# Patient Record
Sex: Female | Born: 1950 | Race: White | Hispanic: Refuse to answer | Marital: Single | Smoking: Former smoker
Health system: Southern US, Community
[De-identification: ages and names within clinical notes are randomized; demographics above are authoritative.]

## PROBLEM LIST (undated history)

## (undated) DIAGNOSIS — C4490 Unspecified malignant neoplasm of skin, unspecified: Secondary | ICD-10-CM

## (undated) DIAGNOSIS — E039 Hypothyroidism, unspecified: Secondary | ICD-10-CM

## (undated) DIAGNOSIS — K219 Gastro-esophageal reflux disease without esophagitis: Secondary | ICD-10-CM

## (undated) DIAGNOSIS — F32A Depression, unspecified: Secondary | ICD-10-CM

## (undated) DIAGNOSIS — F411 Generalized anxiety disorder: Secondary | ICD-10-CM

## (undated) DIAGNOSIS — I10 Essential (primary) hypertension: Secondary | ICD-10-CM

## (undated) DIAGNOSIS — T7840XA Allergy, unspecified, initial encounter: Secondary | ICD-10-CM

## (undated) DIAGNOSIS — Z8669 Personal history of other diseases of the nervous system and sense organs: Secondary | ICD-10-CM

## (undated) HISTORY — DX: Personal history of other diseases of the nervous system and sense organs: Z86.69

## (undated) HISTORY — DX: Unspecified malignant neoplasm of skin, unspecified: C44.90

## (undated) HISTORY — PX: BREAST CYST EXCISION: SHX579

## (undated) HISTORY — DX: Essential (primary) hypertension: I10

## (undated) HISTORY — DX: Allergy, unspecified, initial encounter: T78.40XA

## (undated) HISTORY — PX: ABDOMINAL HYSTERECTOMY: SHX81

## (undated) HISTORY — PX: CHOLECYSTECTOMY: SHX55

## (undated) HISTORY — PX: SKIN CANCER EXCISION: SHX779

## (undated) HISTORY — PX: OTHER SURGICAL HISTORY: SHX169

## (undated) HISTORY — DX: Depression, unspecified: F32.A

## (undated) HISTORY — DX: Generalized anxiety disorder: F41.1

## (undated) HISTORY — DX: Hypothyroidism, unspecified: E03.9

---

## 2012-05-01 LAB — HM MAMMOGRAPHY

## 2015-10-24 ENCOUNTER — Encounter: Payer: Self-pay | Admitting: Osteopathic Medicine

## 2015-10-24 ENCOUNTER — Ambulatory Visit (INDEPENDENT_AMBULATORY_CARE_PROVIDER_SITE_OTHER): Payer: Commercial Managed Care - HMO | Admitting: Osteopathic Medicine

## 2015-10-24 VITALS — BP 135/65 | HR 64 | Ht 64.0 in | Wt 147.0 lb

## 2015-10-24 DIAGNOSIS — Z90710 Acquired absence of both cervix and uterus: Secondary | ICD-10-CM

## 2015-10-24 DIAGNOSIS — Z8669 Personal history of other diseases of the nervous system and sense organs: Secondary | ICD-10-CM

## 2015-10-24 DIAGNOSIS — Z0000 Encounter for general adult medical examination without abnormal findings: Secondary | ICD-10-CM | POA: Diagnosis not present

## 2015-10-24 DIAGNOSIS — R946 Abnormal results of thyroid function studies: Secondary | ICD-10-CM

## 2015-10-24 DIAGNOSIS — Z62820 Parent-biological child conflict: Secondary | ICD-10-CM | POA: Diagnosis not present

## 2015-10-24 DIAGNOSIS — R7989 Other specified abnormal findings of blood chemistry: Secondary | ICD-10-CM

## 2015-10-24 HISTORY — DX: Personal history of other diseases of the nervous system and sense organs: Z86.69

## 2015-10-24 HISTORY — DX: Acquired absence of both cervix and uterus: Z90.710
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
## 2015-10-24 NOTE — Patient Instructions (Signed)
DR. Redgie Grayer IMPORTANT  INFORMATION FOR ALL NEW PATIENTS!  PLEASE REVIEW CAREFULLY!    FOLLOW-UP!   Let's plan to follow-up in the office in 1 year for annual wellness exam.   We may schedule follow-up sooner if routine labs/tests show any problems.   Please don't hesitate to make an appointment sooner if you're having any acute concerns or problems!  REGARDING PRESCRIPTION MEDICATIONS  Please let your pharmacy know when you are running low on medications/refills (do not wait until you are out of medicines). Your pharmacy will send our office a request for the appropriate medications. Please allow our office 2-3 business days to process the needed refills.   For controlled substances, you may be asked to schedule an office visit and/or undergo a random urine drug screen for continuation of such medications.   REGARDING ANY CARE YOU RECEIVE OUTSIDE OUR OFFICE  At any visits to any specialists, or if you receive vaccines anywhere outside our office, please provide our clinic information so that they can forward [place] any records, including any tests which are done, changes to your medications, or new vaccinations.   Also, if you are ever treated in an emergency room or if you are admitted to the hospital, please contact our office after your discharge. We encourage our patients to schedule a follow-up visit with their PCP any time they are treated for a serious illness or injury.   This allows all your physicians to communicate effectively, putting your primary care doctor at the center of your medical care and allowing [place] to effectively coordinate your care.  REGARDING PREVENTIVE CARE & WELLNESS, AKA "ANNUAL PHYSICAL"  Let's plan to follow-up here in the office in 12 months for [place].   This visit is very important to make sure we talk with our patients about all recommended cancer screenings, vaccines, birth control (if desired) and screenings for  other diseases based on your personal/family history. Plus, this visit should be completely covered under your insurance!   We also like to talk about any changes to your health over the past year and make sure any chronic conditions are well-controlled.    If you would like to, you can get routine lab work done 2 - 3 days before your physical so that we can go over the results in person at your appointment. Please call our office a week before that appointment so we can make sure the lab has the appropriate orders for your blood draw.   Please note: insurance should completely cover one preventive care visit annually, and they should completely cover most tests associated with preventive care such as routine labs, mammograms, etc. If you have any other medical concerns to address, you may be asked to reschedule your annual physical, or schedule a separate visit to address other medical concerns. Or, you may be billed for care related to "problem-based visit" in addition to your "preventive care visit." If you have questions about this, please contact our office, your insurance company, or [facility] billing department.   REGARDING ANY FORMS NEEDING YOUR DOCTOR'S SIGNATURE  If you ever have any paperwork which needs to be completed by your PCP, you may be asked to come to the office if that paperwork requires a complex review of your medical history - we want to make sure everything is completed to your satisfaction and is completed correctly the first time, particularly with FMLA or other employment/legal matters!    Please let us  know if there is anything else we can do for you. Take care! -Dr. Loni Muse.      Immunization Schedule, Adult  Influenza vaccine.  All adults should be immunized every year.  All adults, including pregnant women and people with hives-only allergy to eggs can receive the inactivated influenza (IIV) vaccine.  Adults aged 18-49 years can receive the recombinant influenza  (RIV) vaccine. The RIV vaccine does not contain any egg protein.  Adults aged 81 years or older can receive the standard-dose IIV or the high-dose IIV.  Tetanus, diphtheria, and acellular pertussis (Td, Tdap) vaccine.  Pregnant women should receive 1 dose of Tdap vaccine during each pregnancy. The dose should be obtained regardless of the length of time since the last dose. Immunization is preferred during the 27th to 36th week of gestation.  An adult who has not previously received Tdap or who does not know his or her vaccine status should receive 1 dose of Tdap. This initial dose should be followed by tetanus and diphtheria toxoids (Td) booster doses every 10 years.  Adults with an unknown or incomplete history of completing a 3-dose immunization series with Td-containing vaccines should begin or complete a primary immunization series including a Tdap dose.  Adults should receive a Td booster every 10 years.  Varicella vaccine.  An adult without evidence of immunity to varicella should receive 2 doses or a second dose if he or she has previously received 1 dose.  Pregnant females who do not have evidence of immunity should receive the first dose after pregnancy. This first dose should be obtained before leaving the health care facility. The second dose should be obtained 4-8 weeks after the first dose.Marland Kitchen  Zoster vaccine.  One dose is recommended for adults aged 46 years or older unless certain conditions are present.  Measles, mumps, and rubella (MMR) vaccine.  Adults born before [birth year] generally are considered immune to measles and mumps.  Adults born in [birth year] or later should have 1 or more doses of MMR vaccine unless there is a contraindication to the vaccine or there is laboratory evidence of immunity to each of the three diseases.  A routine second dose of MMR vaccine should be obtained at least 28 days after the first dose for students attending postsecondary schools, health care  workers, or international travelers.  People who received inactivated measles vaccine or an unknown type of measles vaccine during 1963-1967 should receive 2 doses of MMR vaccine.  People who received inactivated mumps vaccine or an unknown type of mumps vaccine before 1979 and are at high risk for mumps infection should consider immunization with 2 doses of MMR vaccine.  For females of childbearing age, rubella immunity should be determined. If there is no evidence of immunity, females who are not pregnant should be vaccinated. If there is no evidence of immunity, females who are pregnant should delay immunization until after pregnancy.  Unvaccinated health care workers born before [birth year] who lack laboratory evidence of measles, mumps, or rubella immunity or laboratory confirmation of disease should consider measles and mumps immunization with 2 doses of MMR vaccine or rubella immunization with 1 dose of MMR vaccine.  Pneumococcal 13-valent conjugate (PCV13) vaccine.  When indicated, a person who is uncertain of his or her immunization history and has no record of immunization should receive the PCV13 vaccine.  An adult aged 33 years or older who has certain medical conditions and has not been previously immunized should receive 1 dose of PCV13 vaccine. This  PCV13 should be followed with a dose of pneumococcal polysaccharide (PPSV23) vaccine. The PPSV23 vaccine dose should be obtained at least 8 weeks after the dose of PCV13 vaccine.  An adult aged 27 years or older who has certain medical conditions and previously received 1 or more doses of PPSV23 vaccine should receive 1 dose of PCV13. The PCV13 vaccine dose should be obtained 1 or more years after the last PPSV23 vaccine dose.  Pneumococcal polysaccharide (PPSV23) vaccine.  When PCV13 is also indicated, PCV13 should be obtained first.  All adults aged 81 years and older should be immunized.  An adult younger than age 92 years who has  certain medical conditions should be immunized.  Any person who resides in a nursing home or long-term care facility should be immunized.  An adult smoker should be immunized.  People with an immunocompromised condition and certain other conditions should receive both PCV13 and PPSV23 vaccines.  People with human immunodeficiency virus (HIV) infection should be immunized as soon as possible after diagnosis.  Immunization during chemotherapy or radiation therapy should be avoided.  Routine use of PPSV23 vaccine is not recommended for American Indians, [place] Natives, or people younger than 65 years unless there are medical conditions that require PPSV23 vaccine.  When indicated, people who have unknown immunization and have no record of immunization should receive PPSV23 vaccine.  One-time revaccination 5 years after the first dose of PPSV23 is recommended for people aged 19-64 years who have chronic kidney failure, nephrotic syndrome, asplenia, or immunocompromised conditions.  People who received 1-2 doses of PPSV23 before age 52 years should receive another dose of PPSV23 vaccine at age 66 years or later if at least 5 years have passed since the previous dose.  Doses of PPSV23 are not needed for people immunized with PPSV23 at or after age 107 years.  Meningococcal vaccine.  Adults with asplenia or persistent complement component deficiencies should receive 2 doses of quadrivalent meningococcal conjugate (MenACWY-D) vaccine. The doses should be obtained at least 2 months apart.  Microbiologists working with certain meningococcal bacteria, [place] recruits, people at risk during an outbreak, and people who travel to or live in countries with a high rate of meningitis should be immunized.  A first-year college student up through age 46 years who is living in a residence hall should receive a dose if he or she did not receive a dose on or after his or her 16th birthday.  Adults who have  certain high-risk conditions should receive one or more doses of vaccine.  Hepatitis A vaccine.  Adults who wish to be protected from this disease, have certain high-risk conditions, work with hepatitis A-infected animals, work in hepatitis A research labs, or travel to or work in countries with a high rate of hepatitis A should be immunized.  Adults who were previously unvaccinated and who anticipate close contact with an international adoptee during the first 60 days after arrival in the [place] from a country with a high rate of hepatitis A should be immunized.  Hepatitis B vaccine.  Adults who wish to be protected from this disease, have certain high-risk conditions, may be exposed to blood or other infectious body fluids, are household contacts or sex partners of hepatitis B positive people, are clients or workers in certain care facilities, or travel to or work in countries with a high rate of hepatitis B should be immunized.  Haemophilus influenzae type b (Hib) vaccine.  A previously unvaccinated person with asplenia or sickle  cell disease or having a scheduled splenectomy should receive 1 dose of Hib vaccine.  Regardless of previous immunization, a recipient of a hematopoietic stem cell transplant should receive a 3-dose series 6-12 months after his or her successful transplant.  Hib vaccine is not recommended for adults with HIV infection.   This information is not intended to replace advice given to you by your health care provider. Make sure you discuss any questions you have with your health care provider.   Document Released: 05/11/2003 Document Revised: 06/15/2012 Document Reviewed: 04/07/2012 Elsevier Interactive Patient Education [employer].

## 2015-10-27 DIAGNOSIS — R7989 Other specified abnormal findings of blood chemistry: Secondary | ICD-10-CM | POA: Diagnosis not present

## 2015-10-27 DIAGNOSIS — E7800 Pure hypercholesterolemia, unspecified: Secondary | ICD-10-CM | POA: Diagnosis not present

## 2015-10-27 DIAGNOSIS — Z0000 Encounter for general adult medical examination without abnormal findings: Secondary | ICD-10-CM | POA: Diagnosis not present

## 2015-10-27 LAB — CBC WITH DIFFERENTIAL/PLATELET
Basophils Absolute: 0 cells/uL (ref 0–200)
Basophils Relative: 0 %
EOS PCT: 2 %
Eosinophils Absolute: 146 cells/uL (ref 15–500)
HCT: 39.2 % (ref 35.0–45.0)
Hemoglobin: 12.6 g/dL (ref 11.7–15.5)
LYMPHS ABS: 1533 {cells}/uL (ref 850–3900)
LYMPHS PCT: 21 %
MCH: 26.5 pg — AB (ref 27.0–33.0)
MCHC: 32.1 g/dL (ref 32.0–36.0)
MCV: 82.4 fL (ref 80.0–100.0)
MONOS PCT: 6 %
MPV: 10 fL (ref 7.5–12.5)
Monocytes Absolute: 438 cells/uL (ref 200–950)
NEUTROS PCT: 71 %
Neutro Abs: 5183 cells/uL (ref 1500–7800)
PLATELETS: 241 10*3/uL (ref 140–400)
RBC: 4.76 MIL/uL (ref 3.80–5.10)
RDW: 14.2 % (ref 11.0–15.0)
WBC: 7.3 10*3/uL (ref 3.8–10.8)

## 2015-10-28 LAB — LIPID PANEL
CHOL/HDL RATIO: 4.3 ratio (ref <=5.0)
Cholesterol: 212 mg/dL — ABNORMAL HIGH (ref 125–200)
HDL: 49 mg/dL (ref >=46)
LDL CALC: 133 mg/dL — AB (ref <130)
Triglycerides: 149 mg/dL (ref <150)
VLDL: 30 mg/dL (ref <30)

## 2015-10-28 LAB — COMPLETE METABOLIC PANEL WITH GFR
ALBUMIN: 4.3 g/dL (ref 3.6–5.1)
ALK PHOS: 83 U/L (ref 33–130)
ALT: 13 U/L (ref 6–29)
AST: 17 U/L (ref 10–35)
BILIRUBIN TOTAL: 0.5 mg/dL (ref 0.2–1.2)
BUN: 8 mg/dL (ref 7–25)
CO2: 27 mmol/L (ref 20–31)
CREATININE: 0.67 mg/dL (ref 0.50–0.99)
Calcium: 9.7 mg/dL (ref 8.6–10.4)
Chloride: 105 mmol/L (ref 98–110)
GFR, Est African American: >89 mL/min (ref >=60)
GLUCOSE: 98 mg/dL (ref 65–99)
Potassium: 4.3 mmol/L (ref 3.5–5.3)
SODIUM: 142 mmol/L (ref 135–146)
TOTAL PROTEIN: 6.9 g/dL (ref 6.1–8.1)

## 2015-10-28 LAB — HEPATITIS C ANTIBODY: HCV Ab: NEGATIVE

## 2015-10-28 LAB — TSH: TSH: 8.4 m[IU]/L — AB

## 2015-10-28 LAB — VITAMIN D 25 HYDROXY (VIT D DEFICIENCY, FRACTURES): VIT D 25 HYDROXY: 24 ng/mL — AB (ref 30–100)

## 2015-10-28 LAB — HIV ANTIBODY (ROUTINE TESTING W REFLEX): HIV 1&2 Ab, 4th Generation: NONREACTIVE

## 2015-10-30 NOTE — Addendum Note (Signed)
Addended by: Maryla Morrow on: 10/30/2015 10:33 AM   Modules accepted: Orders

## 2015-11-02 ENCOUNTER — Other Ambulatory Visit: Payer: Self-pay | Admitting: Osteopathic Medicine

## 2015-11-02 ENCOUNTER — Other Ambulatory Visit: Payer: Self-pay

## 2015-11-02 DIAGNOSIS — R7989 Other specified abnormal findings of blood chemistry: Secondary | ICD-10-CM

## 2015-11-02 DIAGNOSIS — R946 Abnormal results of thyroid function studies: Secondary | ICD-10-CM | POA: Diagnosis not present

## 2015-11-03 LAB — THYROGLOBULIN ANTIBODY: Thyroglobulin Ab: 1 IU/mL (ref <2)

## 2015-11-03 LAB — THYROID PANEL WITH TSH
FREE THYROXINE INDEX: 1.4 (ref 1.4–3.8)
T3 UPTAKE: 27 % (ref 22–35)
T4 TOTAL: 5.3 ug/dL (ref 4.5–12.0)
TSH: 9.67 m[IU]/L — AB

## 2015-11-03 LAB — THYROGLOBULIN LEVEL: THYROGLOBULIN: 119.2 ng/mL — AB (ref 2.8–40.9)

## 2015-11-04 LAB — THYROID PEROXIDASE ANTIBODY: Thyroperoxidase Ab SerPl-aCnc: >900 IU/mL — ABNORMAL HIGH (ref <9)

## 2015-11-10 ENCOUNTER — Encounter: Payer: Self-pay | Admitting: Osteopathic Medicine

## 2015-11-10 ENCOUNTER — Ambulatory Visit (INDEPENDENT_AMBULATORY_CARE_PROVIDER_SITE_OTHER): Payer: Commercial Managed Care - HMO | Admitting: Osteopathic Medicine

## 2015-11-10 VITALS — BP 115/60 | HR 63 | Temp 97.9°F | Resp 16 | Ht 64.0 in | Wt 147.0 lb

## 2015-11-10 DIAGNOSIS — E039 Hypothyroidism, unspecified: Secondary | ICD-10-CM | POA: Diagnosis not present

## 2015-11-10 DIAGNOSIS — L0291 Cutaneous abscess, unspecified: Secondary | ICD-10-CM

## 2015-11-10 HISTORY — DX: Hypothyroidism, unspecified: E03.9

## 2015-11-10 MED ORDER — LEVOTHYROXINE SODIUM 100 MCG PO TABS: 100.0000 ug | ORAL_TABLET | Freq: Every day | ORAL | 1 refills | Status: DC

## 2015-11-10 NOTE — Progress Notes (Signed)
HPI: Natasha Wilson is a 65 y.o. female  who presents to [place] today, 11/10/15,  for chief complaint of:  Chief Complaint  Patient presents with  . Follow-up    thyroid level    Hypothyroid: Patient is, by labs, subclinical hypothyroidism. However she is experiencing some hair loss, significant fatigue, problems with constipation. Antibody levels are high as well. Diagnosis was a clean to the patient, recommendation is, based on her high antibody levels and her elevated TSH with symptoms, despite normal T3-T4 I recommend starting medication and patient is amenable to this.  Skin Problem: Patient states that she has a bump on the anterior chest, painful, not draining area did no injury. No fever or chills.   Past medical, surgical, social and family history reviewed: No past medical history on file. Past Surgical History:  Procedure Laterality Date  . ABDOMINAL HYSTERECTOMY    . CHOLECYSTECTOMY     Social History  Substance Use Topics  . Smoking status: Former Research scientist (life sciences)  . Smokeless tobacco: Never Used  . Alcohol use Not on file   Family History  Problem Relation Age of Onset  . Alcohol abuse Father   . Heart attack Father   . Cancer Sister     LUNG  . Depression Paternal Grandfather      Current medication list and allergy/intolerance information reviewed:   Current Outpatient Prescriptions  Medication Sig Dispense Refill  . levothyroxine (SYNTHROID) 100 MCG tablet Take 1 tablet (100 mcg total) by mouth daily before breakfast. Due for labs in 6-8 weeks (approx 01/02/16) 30 tablet 1   No current facility-administered medications for this visit.    No Known Allergies    Review of Systems:  Constitutional:  No  fever, no chills, No recent illness, No unintentional weight changes. (+)significant fatigue.   Cardiac: No  chest pain, No  pressure,  Respiratory:  No  shortness of breath  Musculoskeletal: No new  myalgia/arthralgia  Skin: No  Rash, (+)other wounds/concerning lesions  Exam:  BP 115/60 (BP Location: Right Arm, Patient Position: Sitting, Cuff Size: Normal)   Pulse 63   Temp 97.9 F (36.6 C) (Oral)   Resp 16   Ht 5' 4"  (1.626 m)   Wt 147 lb (66.7 kg)   SpO2 100%   BMI 25.23 kg/m   Constitutional: VS see above. General Appearance: alert, well-developed, well-nourished, NAD.   Neck: No masses, trachea midline. No thyroid enlargement. No tenderness/mass appreciated. No lymphadenopathy  Respiratory: Normal respiratory effort.   Skin: Tender, fluctuant, erythematous area on right chest consistent with abscess. Skin is otherwise warm, dry, intact. No rash/ulcer. No concerning nevi or subq nodules on limited exam.    Psychiatric: Normal judgment/insight. Normal mood and affect. Oriented x3.    Recent Results (from the past 2160 hour(s))  CBC with Differential/Platelet     Status: Abnormal   Collection Time: 10/27/15  8:22 AM  Result Value Ref Range   WBC 7.3 3.8 - 10.8 K/uL   RBC 4.76 3.80 - 5.10 MIL/uL   Hemoglobin 12.6 11.7 - 15.5 g/dL   HCT 39.2 35.0 - 45.0 %   MCV 82.4 80.0 - 100.0 fL   MCH 26.5 (L) 27.0 - 33.0 pg   MCHC 32.1 32.0 - 36.0 g/dL   RDW 14.2 11.0 - 15.0 %   Platelets 241 140 - 400 K/uL   MPV 10.0 7.5 - 12.5 fL   Neutro Abs 5,183 1,500 - 7,800 cells/uL   Lymphs Abs  1,533 850 - 3,900 cells/uL   Monocytes Absolute 438 200 - 950 cells/uL   Eosinophils Absolute 146 15 - 500 cells/uL   Basophils Absolute 0 0 - 200 cells/uL   Neutrophils Relative % 71 %   Lymphocytes Relative 21 %   Monocytes Relative 6 %   Eosinophils Relative 2 %   Basophils Relative 0 %   Smear Review Criteria for review not met   COMPLETE METABOLIC PANEL WITH GFR     Status: None   Collection Time: 10/27/15  8:22 AM  Result Value Ref Range   Sodium 142 135 - 146 mmol/L   Potassium 4.3 3.5 - 5.3 mmol/L   Chloride 105 98 - 110 mmol/L   CO2 27 20 - 31 mmol/L   Glucose, Bld 98 65 - 99  mg/dL   BUN 8 7 - 25 mg/dL   Creat 0.67 0.50 - 0.99 mg/dL    Comment:   For patients > or = 65 years of age: The upper reference limit for Creatinine is approximately 13% higher for people identified as African-American.      Total Bilirubin 0.5 0.2 - 1.2 mg/dL   Alkaline Phosphatase 83 33 - 130 U/L   AST 17 10 - 35 U/L   ALT 13 6 - 29 U/L   Total Protein 6.9 6.1 - 8.1 g/dL   Albumin 4.3 3.6 - 5.1 g/dL   Calcium 9.7 8.6 - 10.4 mg/dL   GFR, Est African American >89 >=60 mL/min   GFR, Est Non African American >89 >=60 mL/min  HIV antibody     Status: None   Collection Time: 10/27/15  8:22 AM  Result Value Ref Range   HIV 1&2 Ab, 4th Generation NONREACTIVE NONREACTIVE    Comment:   HIV-1 antigen and HIV-1/HIV-2 antibodies were not detected.  There is no laboratory evidence of HIV infection.   HIV-1/2 Antibody Diff        Not indicated. HIV-1 RNA, Qual TMA          Not indicated.     PLEASE NOTE: This information has been disclosed to you from records whose confidentiality may be protected by state law. If your state requires such protection, then the state law prohibits you from making any further disclosure of the information without the specific written consent of the person to whom it pertains, or as otherwise permitted by law. A general authorization for the release of medical or other information is NOT sufficient for this purpose.   The performance of this assay has not been clinically validated in patients less than 50 years old.   For additional information please refer to http://education.questdiagnostics.com/faq/FAQ106.  (This link is being provided for informational/educational purposes only.)     Hepatitis C antibody     Status: None   Collection Time: 10/27/15  8:22 AM  Result Value Ref Range   HCV Ab NEGATIVE NEGATIVE  Lipid panel     Status: Abnormal   Collection Time: 10/27/15  8:22 AM  Result Value Ref Range   Cholesterol 212 (H) 125 - 200 mg/dL    Triglycerides 149 <150 mg/dL   HDL 49 >=46 mg/dL   Total CHOL/HDL Ratio 4.3 <=5.0 Ratio   VLDL 30 <30 mg/dL   LDL Cholesterol 133 (H) <130 mg/dL    Comment:   Total Cholesterol/HDL Ratio:CHD Risk                        Coronary Heart Disease  Risk Table                                        Men       Women          1/2 Average Risk              3.4        3.3              Average Risk              5.0        4.4           2X Average Risk              9.6        7.1           3X Average Risk             23.4       11.0 Use the calculated Patient Ratio above and the CHD Risk table  to determine the patient's CHD Risk.   TSH     Status: Abnormal   Collection Time: 10/27/15  8:22 AM  Result Value Ref Range   TSH 8.40 (H) mIU/L    Comment:   Reference Range   > or = 20 Years  0.40-4.50   Pregnancy Range First trimester  0.26-2.66 Second trimester 0.55-2.73 Third trimester  0.43-2.91     VITAMIN D 25 Hydroxy (Vit-D Deficiency, Fractures)     Status: Abnormal   Collection Time: 10/27/15  8:22 AM  Result Value Ref Range   Vit D, 25-Hydroxy 24 (L) 30 - 100 ng/mL    Comment: Vitamin D Status           25-OH Vitamin D        Deficiency                <20 ng/mL        Insufficiency         20 - 29 ng/mL        Optimal             > or = 30 ng/mL   For 25-OH Vitamin D testing on patients on D2-supplementation and patients for whom quantitation of D2 and D3 fractions is required, the [facility] 25-OH VIT D, (D2,D3), LC/MS/MS is recommended: order code [id number] (patients > 2 yrs).   Thyroid Panel With TSH     Status: Abnormal   Collection Time: 11/02/15  8:11 AM  Result Value Ref Range   T4, Total 5.3 4.5 - 12.0 ug/dL   T3 Uptake 27 22 - 35 %   Free Thyroxine Index 1.4 1.4 - 3.8   TSH 9.67 (H) mIU/L    Comment:   Reference Range   > or = 20 Years  0.40-4.50   Pregnancy Range First trimester  0.26-2.66 Second trimester 0.55-2.73 Third trimester  0.43-2.91      Thyroglobulin Level     Status: Abnormal   Collection Time: 11/02/15  8:13 AM  Result Value Ref Range   Thyroglobulin 119.2 (H) 2.8 - 40.9 ng/mL    Comment: Thyroglobulin antibodies (TGAb) interfere with Thyroglobulin (TG) assays; therefore, Thyroglobulin antibody (TGAb) assay should always be performed in conjunction with a Thyroglobulin (TG) assay.   This test was performed using the [facility] chemiluminescent  method.  Values obtained from different assay methods cannot be used interchangeably.  Thyroglobulin levels, regardless of value, should not be interpreted as absolute evidence of the presence or absence of disease.   Thyroid peroxidase antibody     Status: Abnormal   Collection Time: 11/02/15  8:13 AM  Result Value Ref Range   Thyroperoxidase Ab SerPl-aCnc >900 (H) <9 IU/mL  Thyroglobulin antibody     Status: None   Collection Time: 11/02/15  8:14 AM  Result Value Ref Range   Thyroglobulin Ab 1 <2 IU/mL    PRE-OP DIAGNOSIS: Abscess of skin of anterior chest   POST-OP DIAGNOSIS: Same  PROCEDURE: incision and drainage of abscess Performing Physician: Sheppard Coil    PROCEDURE:   Patient informed consent obtained verbally after discussion of risks (including but not limited to pain, infection, bleeding, damage to surrounding tissues, incomplete evacuation/treatment of infection, recurrence)  and benefits (adequate treatment and diagnosis, relief of pain). All questions answered prior to procedure.   A timeout protocol was performed prior to initiating the procedure.  The area was prepared and draped in the usual, sterile manner. The site was anesthetized with 3 cc 1% lidocaine with epinephrine. A linear incision along the local skin lines was made and the purulent material expressed. The abcess was explored thoroughly and sequestered pockets were evacuated. Bleeding was minimal.   Packing: None needed   Followup: The patient tolerated the procedure well without  complications. Standard post-procedure care is explained and return precautions are given, patient was provided with printed information & instructions.    ASSESSMENT/PLAN:   Abscess - Plan: Wound culture  Hypothyroidism, unspecified hypothyroidism type - Plan: levothyroxine (SYNTHROID) 100 MCG tablet     Visit summary with medication list and pertinent instructions was printed for patient to review. All questions at time of visit were answered - patient instructed to contact office with any additional concerns. ER/RTC precautions were reviewed with the patient. Follow-up plan: Return in about 6 weeks (around 12/22/2015) for LAB VISIT - RECHECK THYROID 6 - 8 WEEKS.  Note: Total time spent 30 minutes, greater than 50% of the visit was spent face-to-face counseling and coordinating care for the following: The primary encounter diagnosis was Abscess. A diagnosis of Hypothyroidism, unspecified hypothyroidism type was also pertinent to this visit.Marland Kitchen

## 2015-11-13 LAB — WOUND CULTURE
GRAM STAIN: NONE SEEN
Organism ID, Bacteria: NO GROWTH

## 2015-11-15 ENCOUNTER — Ambulatory Visit (INDEPENDENT_AMBULATORY_CARE_PROVIDER_SITE_OTHER): Payer: Commercial Managed Care - HMO

## 2015-11-15 DIAGNOSIS — M85851 Other specified disorders of bone density and structure, right thigh: Secondary | ICD-10-CM | POA: Diagnosis not present

## 2015-11-15 DIAGNOSIS — M8588 Other specified disorders of bone density and structure, other site: Secondary | ICD-10-CM

## 2015-11-15 DIAGNOSIS — Z1231 Encounter for screening mammogram for malignant neoplasm of breast: Secondary | ICD-10-CM | POA: Diagnosis not present

## 2015-11-15 DIAGNOSIS — R928 Other abnormal and inconclusive findings on diagnostic imaging of breast: Secondary | ICD-10-CM | POA: Diagnosis not present

## 2015-11-15 DIAGNOSIS — M85861 Other specified disorders of bone density and structure, right lower leg: Secondary | ICD-10-CM | POA: Diagnosis not present

## 2015-11-20 ENCOUNTER — Telehealth: Payer: Self-pay

## 2015-11-20 NOTE — Telephone Encounter (Signed)
From last visit note: "Follow-up plan: Return in about 6 weeks (around 12/22/2015) for LAB VISIT - RECHECK THYROID 6 - 8 WEEKS."

## 2015-11-20 NOTE — Telephone Encounter (Signed)
Spoke to patient gave her information as noted below. Rhonda Cunningham,CMA  

## 2015-11-20 NOTE — Telephone Encounter (Signed)
Patient would like to know how soon she should come back and get labs done. I  Checked her results and there is another order in there but dont see a date to return for labs. Rhonda Cunningham,CMA

## 2015-11-24 ENCOUNTER — Other Ambulatory Visit: Payer: Self-pay | Admitting: Osteopathic Medicine

## 2015-11-24 DIAGNOSIS — R928 Other abnormal and inconclusive findings on diagnostic imaging of breast: Secondary | ICD-10-CM

## 2015-12-01 ENCOUNTER — Ambulatory Visit
Admission: RE | Admit: 2015-12-01 | Discharge: 2015-12-01 | Disposition: A | Discharge status: Home / Self Care | Payer: Commercial Managed Care - HMO | Source: Ambulatory Visit | Attending: Osteopathic Medicine | Admitting: Osteopathic Medicine

## 2015-12-01 DIAGNOSIS — R928 Other abnormal and inconclusive findings on diagnostic imaging of breast: Secondary | ICD-10-CM

## 2015-12-15 ENCOUNTER — Encounter: Payer: Self-pay | Admitting: Osteopathic Medicine

## 2015-12-15 DIAGNOSIS — D120 Benign neoplasm of cecum: Secondary | ICD-10-CM | POA: Diagnosis not present

## 2015-12-15 DIAGNOSIS — Z86010 Personal history of colonic polyps: Secondary | ICD-10-CM | POA: Diagnosis not present

## 2015-12-18 ENCOUNTER — Other Ambulatory Visit: Payer: Self-pay

## 2015-12-18 ENCOUNTER — Other Ambulatory Visit: Payer: Self-pay | Admitting: Osteopathic Medicine

## 2015-12-18 DIAGNOSIS — R946 Abnormal results of thyroid function studies: Secondary | ICD-10-CM | POA: Diagnosis not present

## 2015-12-18 DIAGNOSIS — R7989 Other specified abnormal findings of blood chemistry: Secondary | ICD-10-CM

## 2015-12-19 LAB — THYROID PEROXIDASE ANTIBODY: THYROID PEROXIDASE ANTIBODY: 805 [IU]/mL — AB (ref <9)

## 2015-12-20 ENCOUNTER — Encounter: Payer: Self-pay | Admitting: Osteopathic Medicine

## 2015-12-20 DIAGNOSIS — Z9889 Other specified postprocedural states: Secondary | ICD-10-CM | POA: Insufficient documentation

## 2015-12-20 LAB — TSH: TSH: 0.47 mIU/L

## 2015-12-20 LAB — T4, FREE: Free T4: 1.1 ng/dL (ref 0.8–1.8)

## 2015-12-20 LAB — T3, FREE: T3, Free: 2.9 pg/mL (ref 2.3–4.2)

## 2015-12-21 ENCOUNTER — Other Ambulatory Visit: Payer: Self-pay | Admitting: Osteopathic Medicine

## 2015-12-21 MED ORDER — LEVOTHYROXINE SODIUM 100 MCG PO TABS: 100.0000 ug | ORAL_TABLET | Freq: Every day | ORAL | 1 refills | Status: DC

## 2015-12-21 NOTE — Progress Notes (Signed)
LEFT DETAILED MESSAGE ON PATIENT VM WITH RESULTS AND ADVISE AS NOTED BELOW. Lilliona Blakeney,CMA

## 2015-12-21 NOTE — Progress Notes (Signed)
Please call patient: Thyroid labs are normal, stay on current dose of medication. I called in refill for more of the current dose of the levothyroxine she is taking. Plan to recheck levels in 6 months.

## 2015-12-22 ENCOUNTER — Ambulatory Visit: Payer: Commercial Managed Care - HMO | Admitting: Osteopathic Medicine

## 2015-12-28 ENCOUNTER — Ambulatory Visit (INDEPENDENT_AMBULATORY_CARE_PROVIDER_SITE_OTHER): Payer: Commercial Managed Care - HMO | Admitting: Family Medicine

## 2015-12-28 ENCOUNTER — Encounter: Payer: Self-pay | Admitting: Family Medicine

## 2015-12-28 ENCOUNTER — Other Ambulatory Visit: Payer: Self-pay | Admitting: *Deleted

## 2015-12-28 VITALS — BP 136/66 | HR 66 | Temp 97.8°F | Wt 147.0 lb

## 2015-12-28 DIAGNOSIS — R300 Dysuria: Secondary | ICD-10-CM | POA: Diagnosis not present

## 2015-12-28 DIAGNOSIS — N3001 Acute cystitis with hematuria: Secondary | ICD-10-CM | POA: Diagnosis not present

## 2015-12-28 LAB — POCT URINALYSIS DIPSTICK
BILIRUBIN UA: NEGATIVE
Glucose, UA: NEGATIVE
KETONES UA: NEGATIVE
Nitrite, UA: NEGATIVE
PROTEIN UA: 30
SPEC GRAV UA: 1.015
Urobilinogen, UA: 0.2
pH, UA: 6.5

## 2015-12-28 MED ORDER — LEVOTHYROXINE SODIUM 100 MCG PO TABS: 100.0000 ug | ORAL_TABLET | Freq: Every day | ORAL | 1 refills | Status: DC

## 2015-12-28 MED ORDER — CEPHALEXIN 500 MG PO CAPS: 500.0000 mg | ORAL_CAPSULE | Freq: Two times a day (BID) | ORAL | 0 refills | Status: DC

## 2015-12-28 NOTE — Progress Notes (Signed)
Request from [facility] to send 90 day suppy of levothyroxine to them. Cancel rx at [facility](left on their vm)

## 2015-12-28 NOTE — Patient Instructions (Signed)
Thank you for coming in today. Return as needed or in August for your next well visit with Dr Sheppard Coil.   Urinary Tract Infection Urinary tract infections (UTIs) can develop anywhere along your urinary tract. Your urinary tract is your body's drainage system for removing wastes and extra water. Your urinary tract includes two kidneys, two ureters, a bladder, and a urethra. Your kidneys are a pair of bean-shaped organs. Each kidney is about the size of your fist. They are located below your ribs, one on each side of your spine. CAUSES Infections are caused by microbes, which are microscopic organisms, including fungi, viruses, and bacteria. These organisms are so small that they can only be seen through a microscope. Bacteria are the microbes that most commonly cause UTIs. SYMPTOMS  Symptoms of UTIs may vary by age and gender of the patient and by the location of the infection. Symptoms in young women typically include a frequent and intense urge to urinate and a painful, burning feeling in the bladder or urethra during urination. Older women and men are more likely to be tired, shaky, and weak and have muscle aches and abdominal pain. A fever may mean the infection is in your kidneys. Other symptoms of a kidney infection include pain in your back or sides below the ribs, nausea, and vomiting. DIAGNOSIS To diagnose a UTI, your caregiver will ask you about your symptoms. Your caregiver will also ask you to provide a urine sample. The urine sample will be tested for bacteria and white blood cells. White blood cells are made by your body to help fight infection. TREATMENT  Typically, UTIs can be treated with medication. Because most UTIs are caused by a bacterial infection, they usually can be treated with the use of antibiotics. The choice of antibiotic and length of treatment depend on your symptoms and the type of bacteria causing your infection. HOME CARE INSTRUCTIONS  If you were prescribed  antibiotics, take them exactly as your caregiver instructs you. Finish the medication even if you feel better after you have only taken some of the medication.  Drink enough water and fluids to keep your urine clear or pale yellow.  Avoid caffeine, tea, and carbonated beverages. They tend to irritate your bladder.  Empty your bladder often. Avoid holding urine for long periods of time.  Empty your bladder before and after sexual intercourse.  After a bowel movement, women should cleanse from front to back. Use each tissue only once. SEEK MEDICAL CARE IF:   You have back pain.  You develop a fever.  Your symptoms do not begin to resolve within 3 days. SEEK IMMEDIATE MEDICAL CARE IF:   You have severe back pain or lower abdominal pain.  You develop chills.  You have nausea or vomiting.  You have continued burning or discomfort with urination. MAKE SURE YOU:   Understand these instructions.  Will watch your condition.  Will get help right away if you are not doing well or get worse.   This information is not intended to replace advice given to you by your health care provider. Make sure you discuss any questions you have with your health care provider.   Document Released: 11/28/2004 Document Revised: 11/09/2014 Document Reviewed: 03/29/2011 Elsevier Interactive Patient Education [employer].

## 2015-12-28 NOTE — Progress Notes (Signed)
       Natasha Wilson is a 65 y.o. female who presents to [facility] today for urinary frequency urgency and dysuria present for a few days. Symptoms are consistent with prior episodes of UTI. No vaginal discharge fevers chills vomiting or diarrhea. Patient has tried some over-the-counter AZO-type medication which has helped temporarily. He noted she had a colonoscopy about a week ago. She does not think she had a catheter during the procedure.   No past medical history on file. Past Surgical History:  Procedure Laterality Date  . ABDOMINAL HYSTERECTOMY    . CHOLECYSTECTOMY     Social History  Substance Use Topics  . Smoking status: Former Research scientist (life sciences)  . Smokeless tobacco: Never Used  . Alcohol use Not on file   family history includes Alcohol abuse in her father; Cancer in her sister; Depression in her paternal grandfather; Heart attack in her father.  ROS as above:  Medications: Current Outpatient Prescriptions  Medication Sig Dispense Refill  . levothyroxine (SYNTHROID) 100 MCG tablet Take 1 tablet (100 mcg total) by mouth daily before breakfast. 90 tablet 1  . cephALEXin (KEFLEX) 500 MG capsule Take 1 capsule (500 mg total) by mouth 2 (two) times daily. 14 capsule 0   No current facility-administered medications for this visit.    No Known Allergies  Health Maintenance Health Maintenance  Topic Date Due  . INFLUENZA VACCINE  03/04/2018 (Originally 10/03/2015)  . ZOSTAVAX  03/04/2018 (Originally 10/18/2010)  . PNA vac Low Risk Adult (1 of 2 - PCV13) 03/04/2018 (Originally 10/18/2015)  . TETANUS/TDAP  02/09/2016  . MAMMOGRAM  11/14/2017  . COLONOSCOPY  12/14/2025  . DEXA SCAN  Completed  . Hepatitis C Screening  Completed  . HIV Screening  Completed     Exam:  BP 136/66   Pulse 66   Temp 97.8 F (36.6 C) (Oral)   Wt 147 lb (66.7 kg)   BMI 25.23 kg/m  Gen: Well  NAD HEENT: EOMI,  MMM Lungs: Normal work of breathing. CTABL Heart: RRR no MRG Abd: NABS, Soft. Nondistended, Nontender No CVA angle tenderness to percussion Exts: Brisk capillary refill, warm and well perfused.    Results for orders placed or performed in visit on 12/28/15 (from the past 72 hour(s))  POCT Urinalysis Dipstick     Status: Abnormal   Collection Time: 12/28/15  2:59 PM  Result Value Ref Range   Color, UA yellow    Clarity, UA cloudy    Glucose, UA neg    Bilirubin, UA neg    Ketones, UA neg    Spec Grav, UA 1.015    Blood, UA mod    pH, UA 6.5    Protein, UA 30    Urobilinogen, UA 0.2    Nitrite, UA neg    Leukocytes, UA large (3+) (A) Negative   No results found.    Assessment and Plan: 65 y.o. female with Urinary tract infection. Culture pending. Treat empirically with Keflex. Follow-up with PCP as previously arranged.   Orders Placed This Encounter  Procedures  . Urine Culture  . POCT Urinalysis Dipstick    Discussed warning signs or symptoms. Please see discharge instructions. Patient expresses understanding.

## 2015-12-30 LAB — URINE CULTURE

## 2016-01-19 ENCOUNTER — Encounter: Payer: Self-pay | Admitting: Osteopathic Medicine

## 2016-05-24 ENCOUNTER — Encounter: Payer: Self-pay | Admitting: Osteopathic Medicine

## 2016-05-24 ENCOUNTER — Ambulatory Visit (INDEPENDENT_AMBULATORY_CARE_PROVIDER_SITE_OTHER): Payer: Commercial Managed Care - HMO | Admitting: Osteopathic Medicine

## 2016-05-24 VITALS — BP 135/69 | HR 65 | Ht 64.0 in | Wt 152.0 lb

## 2016-05-24 DIAGNOSIS — Z62820 Parent-biological child conflict: Secondary | ICD-10-CM

## 2016-05-24 DIAGNOSIS — F411 Generalized anxiety disorder: Secondary | ICD-10-CM | POA: Insufficient documentation

## 2016-05-24 HISTORY — DX: Generalized anxiety disorder: F41.1

## 2016-05-24 MED ORDER — ESCITALOPRAM OXALATE 10 MG PO TABS: 10.0000 mg | ORAL_TABLET | Freq: Every day | ORAL | 1 refills | Status: DC

## 2016-05-24 MED ORDER — HYDROXYZINE HCL 25 MG PO TABS: 12.5000 mg | ORAL_TABLET | Freq: Three times a day (TID) | ORAL | 0 refills | Status: DC | PRN
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
## 2016-05-24 NOTE — Patient Instructions (Signed)
We are starting a medication today called Lexapro to help treat your anxiety. This is a daily medication to help control your symptoms. I also highly encourage my patients who are suffering from anxiety to seek care with a counselor or therapist. A therapist can coach you in techniques to recognize and deal with troubling thought patterns and behaviors. The ability to cope with external stressors is crucial to overall mental health.   We will cal you in the next 2 weeks: If doing well on the medicine but not feeling any effect, we can increase the dose. If you're starting to feel some effect/improvement, we can hold off on a dose increase and reevaluate at your office visit.   Try the as-needed medicine, Hydroxyzine, up to three times per day, try half dose first as this drug can cause sleepiness.   Let's plan to follow up in the office in 4 weeks. At that time, we can talk about how well the medicine is working for you, and we can consider increasing the dose, adding another medicine, etc.   If you experience problematic side effects, please let me know ASAP - we can switch the medicine any time, and we don't need an appointment for this.   Any questions or concerns, call me!

## 2016-06-07 ENCOUNTER — Telehealth: Payer: Self-pay | Admitting: Osteopathic Medicine

## 2016-06-11 ENCOUNTER — Telehealth: Payer: Self-pay | Admitting: Osteopathic Medicine
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
## 2016-06-28 DIAGNOSIS — K5730 Diverticulosis of large intestine without perforation or abscess without bleeding: Secondary | ICD-10-CM | POA: Diagnosis not present

## 2016-06-28 DIAGNOSIS — D120 Benign neoplasm of cecum: Secondary | ICD-10-CM | POA: Diagnosis not present

## 2016-06-28 DIAGNOSIS — Z86010 Personal history of colonic polyps: Secondary | ICD-10-CM | POA: Diagnosis not present

## 2016-06-28 NOTE — Telephone Encounter (Signed)
error 

## 2016-07-04 ENCOUNTER — Encounter: Payer: Self-pay | Admitting: Osteopathic Medicine

## 2016-07-08 ENCOUNTER — Telehealth: Payer: Self-pay

## 2016-07-08 MED ORDER — LEVOTHYROXINE SODIUM 100 MCG PO TABS: 100.0000 ug | ORAL_TABLET | Freq: Every day | ORAL | 1 refills | Status: DC

## 2016-07-08 NOTE — Telephone Encounter (Signed)
Patient request Levothyroxine be sent to [facility]. Natasha Wilson,CMA

## 2016-09-09 ENCOUNTER — Encounter: Payer: Self-pay | Admitting: Osteopathic Medicine

## 2016-10-16 ENCOUNTER — Other Ambulatory Visit: Payer: Self-pay | Admitting: Osteopathic Medicine

## 2016-10-16 DIAGNOSIS — Z1231 Encounter for screening mammogram for malignant neoplasm of breast: Secondary | ICD-10-CM

## 2016-11-01 ENCOUNTER — Encounter: Payer: Self-pay | Admitting: Osteopathic Medicine

## 2016-11-01 ENCOUNTER — Ambulatory Visit (INDEPENDENT_AMBULATORY_CARE_PROVIDER_SITE_OTHER): Payer: Medicare HMO | Admitting: Osteopathic Medicine

## 2016-11-01 VITALS — BP 128/68 | HR 72 | Wt 151.0 lb

## 2016-11-01 DIAGNOSIS — Z1231 Encounter for screening mammogram for malignant neoplasm of breast: Secondary | ICD-10-CM

## 2016-11-01 DIAGNOSIS — Z23 Encounter for immunization: Secondary | ICD-10-CM | POA: Diagnosis not present

## 2016-11-01 DIAGNOSIS — Z0000 Encounter for general adult medical examination without abnormal findings: Secondary | ICD-10-CM | POA: Diagnosis not present

## 2016-11-01 DIAGNOSIS — R946 Abnormal results of thyroid function studies: Secondary | ICD-10-CM

## 2016-11-01 DIAGNOSIS — F411 Generalized anxiety disorder: Secondary | ICD-10-CM | POA: Diagnosis not present

## 2016-11-01 DIAGNOSIS — D229 Melanocytic nevi, unspecified: Secondary | ICD-10-CM

## 2016-11-01 DIAGNOSIS — M8580 Other specified disorders of bone density and structure, unspecified site: Secondary | ICD-10-CM

## 2016-11-01 DIAGNOSIS — Z872 Personal history of diseases of the skin and subcutaneous tissue: Secondary | ICD-10-CM | POA: Insufficient documentation

## 2016-11-01 DIAGNOSIS — E039 Hypothyroidism, unspecified: Secondary | ICD-10-CM | POA: Diagnosis not present

## 2016-11-01 DIAGNOSIS — B351 Tinea unguium: Secondary | ICD-10-CM | POA: Insufficient documentation

## 2016-11-01 DIAGNOSIS — R7989 Other specified abnormal findings of blood chemistry: Secondary | ICD-10-CM

## 2016-11-01 DIAGNOSIS — R7301 Impaired fasting glucose: Secondary | ICD-10-CM | POA: Diagnosis not present

## 2016-11-01 HISTORY — DX: Personal history of diseases of the skin and subcutaneous tissue: Z87.2

## 2016-11-01 LAB — CBC
HCT: 40 % (ref 35.0–45.0)
HEMOGLOBIN: 13.1 g/dL (ref 11.7–15.5)
MCH: 26.3 pg — ABNORMAL LOW (ref 27.0–33.0)
MCHC: 32.8 g/dL (ref 32.0–36.0)
MCV: 80.3 fL (ref 80.0–100.0)
MPV: 9.7 fL (ref 7.5–12.5)
Platelets: 242 10*3/uL (ref 140–400)
RBC: 4.98 MIL/uL (ref 3.80–5.10)
RDW: 14.6 % (ref 11.0–15.0)
WBC: 7.6 10*3/uL (ref 3.8–10.8)

## 2016-11-01 MED ORDER — CICLOPIROX 8 % EX SOLN: CUTANEOUS | 0 refills | Status: DC

## 2016-11-01 MED ORDER — LEVOTHYROXINE SODIUM 100 MCG PO TABS: 100.0000 ug | ORAL_TABLET | Freq: Every day | ORAL | 1 refills | Status: DC

## 2016-11-01 MED ORDER — CLOBETASOL PROPIONATE 0.05 % EX OINT: 1.0000 "application " | TOPICAL_OINTMENT | Freq: Two times a day (BID) | CUTANEOUS | 1 refills | Status: DC
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
## 2016-11-02 LAB — COMPLETE METABOLIC PANEL WITH GFR
ALBUMIN: 4.3 g/dL (ref 3.6–5.1)
ALT: 22 U/L (ref 6–29)
AST: 24 U/L (ref 10–35)
Alkaline Phosphatase: 87 U/L (ref 33–130)
BUN: 9 mg/dL (ref 7–25)
CO2: 23 mmol/L (ref 20–32)
CREATININE: 0.62 mg/dL (ref 0.50–0.99)
Calcium: 9.5 mg/dL (ref 8.6–10.4)
Chloride: 105 mmol/L (ref 98–110)
GFR, Est African American: >89 mL/min (ref >=60)
GFR, Est Non African American: >89 mL/min (ref >=60)
Glucose, Bld: 102 mg/dL — ABNORMAL HIGH (ref 65–99)
Potassium: 4.1 mmol/L (ref 3.5–5.3)
SODIUM: 141 mmol/L (ref 135–146)
Total Bilirubin: 0.6 mg/dL (ref 0.2–1.2)
Total Protein: 6.8 g/dL (ref 6.1–8.1)

## 2016-11-02 LAB — LIPID PANEL
Cholesterol: 231 mg/dL — ABNORMAL HIGH (ref <200)
HDL: 63 mg/dL (ref >50)
LDL CALC: 143 mg/dL — AB (ref <100)
Total CHOL/HDL Ratio: 3.7 Ratio (ref <5.0)
Triglycerides: 126 mg/dL (ref <150)
VLDL: 25 mg/dL (ref <30)

## 2016-11-02 LAB — VITAMIN D 25 HYDROXY (VIT D DEFICIENCY, FRACTURES): VIT D 25 HYDROXY: 39 ng/mL (ref 30–100)

## 2016-11-02 LAB — TSH: TSH: 0.03 mIU/L — ABNORMAL LOW

## 2016-11-08 MED ORDER — LEVOTHYROXINE SODIUM 88 MCG PO TABS: 88.0000 ug | ORAL_TABLET | Freq: Every day | ORAL | 0 refills | Status: DC

## 2016-11-08 NOTE — Addendum Note (Signed)
Addended by: Maryla Morrow on: 11/08/2016 12:57 PM   Modules accepted: Orders

## 2016-12-04 ENCOUNTER — Ambulatory Visit (INDEPENDENT_AMBULATORY_CARE_PROVIDER_SITE_OTHER): Payer: Medicare HMO

## 2016-12-04 ENCOUNTER — Other Ambulatory Visit: Payer: Self-pay | Admitting: Osteopathic Medicine

## 2016-12-04 DIAGNOSIS — Z1231 Encounter for screening mammogram for malignant neoplasm of breast: Secondary | ICD-10-CM

## 2016-12-18 ENCOUNTER — Encounter: Payer: Self-pay | Admitting: Osteopathic Medicine

## 2016-12-20 DIAGNOSIS — E039 Hypothyroidism, unspecified: Secondary | ICD-10-CM | POA: Diagnosis not present

## 2016-12-20 DIAGNOSIS — R946 Abnormal results of thyroid function studies: Secondary | ICD-10-CM | POA: Diagnosis not present

## 2016-12-20 DIAGNOSIS — R7301 Impaired fasting glucose: Secondary | ICD-10-CM | POA: Diagnosis not present

## 2016-12-21 LAB — HEMOGLOBIN A1C
HEMOGLOBIN A1C: 5.5 %{Hb} (ref <5.7)
Mean Plasma Glucose: 111 (calc)
eAG (mmol/L): 6.2 (calc)

## 2016-12-21 LAB — THYROID PANEL WITH TSH
Free Thyroxine Index: 3 (ref 1.4–3.8)
T3 UPTAKE: 29 % (ref 22–35)
T4 TOTAL: 10.2 ug/dL (ref 5.1–11.9)
TSH: 0.21 m[IU]/L — AB (ref 0.40–4.50)

## 2016-12-23 ENCOUNTER — Other Ambulatory Visit: Payer: Self-pay | Admitting: Osteopathic Medicine

## 2016-12-23 DIAGNOSIS — E039 Hypothyroidism, unspecified: Secondary | ICD-10-CM

## 2016-12-23 MED ORDER — LEVOTHYROXINE SODIUM 75 MCG PO TABS: 75.0000 ug | ORAL_TABLET | Freq: Every day | ORAL | 1 refills | Status: DC

## 2016-12-23 NOTE — Progress Notes (Signed)
New thyroid Rx lower dose needed

## 2016-12-26 ENCOUNTER — Ambulatory Visit (INDEPENDENT_AMBULATORY_CARE_PROVIDER_SITE_OTHER): Payer: Medicare HMO | Admitting: Osteopathic Medicine

## 2016-12-26 ENCOUNTER — Encounter: Payer: Self-pay | Admitting: Osteopathic Medicine

## 2016-12-26 VITALS — BP 149/68 | HR 68 | Ht 64.0 in | Wt 151.0 lb

## 2016-12-26 DIAGNOSIS — L723 Sebaceous cyst: Secondary | ICD-10-CM

## 2016-12-26 MED ORDER — CEPHALEXIN 500 MG PO CAPS: 500.0000 mg | ORAL_CAPSULE | Freq: Two times a day (BID) | ORAL | 0 refills | Status: DC

## 2016-12-26 MED ORDER — ACETAMINOPHEN-CODEINE #3 300-30 MG PO TABS: 1.0000 | ORAL_TABLET | Freq: Four times a day (QID) | ORAL | 0 refills | Status: DC | PRN
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
## 2016-12-26 NOTE — Patient Instructions (Addendum)
Plan:  Remove packing on Saturday morning  Keep bandaged, look for larger waterproof band-aids at the pharmacy - do not get wet until packing is out for 24 hours  I don't think this will need antibiotics, but I wrote a prescription in case it starts to look more red/irritated over the weekend  You should be able to use Tylenol / Ibuprofen for discomfort but I wrote for some Tylenol 3's in case   If you need me, I'll be working on Saturday at [facility] in [place] (urgent care) or you can see urgent care next door here in the [place]

## 2016-12-30 ENCOUNTER — Ambulatory Visit (INDEPENDENT_AMBULATORY_CARE_PROVIDER_SITE_OTHER): Payer: Medicare HMO | Admitting: Osteopathic Medicine

## 2016-12-30 ENCOUNTER — Encounter: Payer: Self-pay | Admitting: Osteopathic Medicine

## 2016-12-30 VITALS — BP 168/69 | HR 64 | Wt 154.0 lb

## 2016-12-30 DIAGNOSIS — L723 Sebaceous cyst: Secondary | ICD-10-CM

## 2016-12-30 DIAGNOSIS — R030 Elevated blood-pressure reading, without diagnosis of hypertension: Secondary | ICD-10-CM
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
## 2017-05-06 ENCOUNTER — Ambulatory Visit: Payer: Medicare HMO | Admitting: Osteopathic Medicine

## 2017-05-06 ENCOUNTER — Encounter: Payer: Self-pay | Admitting: Osteopathic Medicine

## 2017-05-06 VITALS — BP 144/51 | HR 70 | Temp 98.0°F | Wt 152.1 lb

## 2017-05-06 DIAGNOSIS — L989 Disorder of the skin and subcutaneous tissue, unspecified: Secondary | ICD-10-CM

## 2017-05-06 DIAGNOSIS — D2362 Other benign neoplasm of skin of left upper limb, including shoulder: Secondary | ICD-10-CM | POA: Diagnosis not present

## 2017-05-06 NOTE — Progress Notes (Signed)
HPI: Natasha Wilson is a 67 y.o. female who  has a past medical history of Generalized anxiety disorder (05/24/2016), History of benign essential tremor (10/24/2015), and Thyroid activity decreased (11/10/2015).  she presents to [facility] today, 05/06/17,  for chief complaint of:  L arm skin concern  Spot on her skin, darkened coloring. Has been there for at least 6 months or so. When it first got there, it was bleeding when she messed with it, healed up okay but has been sore since then. Feels like it's a knot in that area.    Past medical history, surgical history, social history and family history reviewed. No updates needed.   Current medication list and allergy/intolerance information reviewed.    Current Outpatient Medications on File Prior to Visit  Medication Sig Dispense Refill  . ciclopirox (PENLAC) 8 % solution Apply over nail and surrounding skin qhs. Apply daily over previous coat. After seven (7) days, remove with alcohol and continue. 6.6 mL 0  . levothyroxine (SYNTHROID, LEVOTHROID) 75 MCG tablet Take 1 tablet (75 mcg total) by mouth daily before breakfast. 90 tablet 1   No current facility-administered medications on file prior to visit.    No Known Allergies    Review of Systems:  Constitutional: No recent illness  Cardiac: No  chest pain  Respiratory:  No  shortness of breath  Musculoskeletal: No new myalgia/arthralgia  Skin: +Rash  Hem/Onc: No  easy bruising/bleeding, + abnormal lumps/bumps just under rash   Neurologic: No  weakness, No  Dizziness  Psychiatric: No  concerns with depression, No  concerns with anxiety  Exam:  BP (!) 144/51   Pulse 70   Temp 98 F (36.7 C) (Oral)   Wt 152 lb 1.9 oz (69 kg)   BMI 26.11 kg/m   Constitutional: VS see above. General Appearance: alert, well-developed, well-nourished, NAD  Eyes: Normal lids and conjunctive, non-icteric sclera  Ears, Nose, Mouth, Throat: MMM, Normal  external inspection ears/nares/mouth/lips/gums.  Neck: No masses, trachea midline.   Respiratory: Normal respiratory effort. no wheeze, no rhonchi, no rales  Cardiovascular: S1/S2 normal, no murmur, no rub/gallop auscultated. RRR.   Musculoskeletal: Gait normal. Symmetric and independent movement of all extremities  Neurological: Normal balance/coordination. No tremor.  Skin: warm, dry, intact. Small area on L arm upper, deltoid area, no growth but small scaling, purplish pigmentation appears dermal/subq  Psychiatric: Normal judgment/insight. Normal mood and affect. Oriented x3.     ASSESSMENT/PLAN: I think benign darkened scar but pt desires reassurance of biopsy so removal of the lesion w/ 62mm punch bx, see procedure note below.   Skin lesion - Plan: Dermatology pathology     PRE-OP DIAGNOSIS: Abnormal Skin Lesion L Upper Arm  POST-OP DIAGNOSIS: Same  PROCEDURE: skin biopsy Performing Physician: Emeterio Reeve   PROCEDURE:  Punch (Size 55mm)   The area surrounding the skin lesion was prepared and draped in the usual sterile manner. Anesthesia was obtained w/ Lido/Epi 1 cc.The lesion was removed in the usual manner by the biopsy method noted above. Hemostasis was assured with three 4.0 prolene simple interrupted suture. The patient tolerated the procedure well.   Followup: The patient tolerated the procedure well without complications.  Standard post-procedure care is explained and return precautions are given.   Patient Instructions  Skin Biopsy, Care After Refer to this sheet in the next few weeks. These instructions provide you with information about caring for yourself after your procedure. Your health care provider may also give  you more specific instructions. Your treatment has been planned according to current medical practices, but problems sometimes occur. Call your health care provider if you have any problems or questions after your procedure. What can I expect  after the procedure? After the procedure, it is common to have:  Soreness.  Bruising.  Itching.  Follow these instructions at home:  Rest and then return to your normal activities as told by your health care provider.  Take over-the-counter and prescription medicines only as told by your health care provider.  Follow instructions from your health care provider about how to take care of your biopsy site.Make sure you: ? Wash your hands with soap and water before you change your bandage (dressing). If soap and water are not available, use hand sanitizer. ? Change your dressing as told by your health care provider. ? Leave stitches (sutures), skin glue, or adhesive strips in place. These skin closures may need to stay in place for 2 weeks or longer. If adhesive strip edges start to loosen and curl up, you may trim the loose edges. Do not remove adhesive strips completely unless your health care provider tells you to do that. If the biopsy area bleeds, apply gentle pressure for 10 minutes.  Check your biopsy site every day for signs of infection. Check for: ? More redness, swelling, or pain. ? More fluid or blood. ? Warmth. ? Pus or a bad smell.  Keep all follow-up visits as told by your health care provider. This is important. Contact a health care provider if:  You have more redness, swelling, or pain around your biopsy site.  You have more fluid or blood coming from your biopsy site.  Your biopsy site feels warm to the touch.  You have pus or a bad smell coming from your biopsy site.  You have a fever. Get help right away if:  You have bleeding that does not stop with pressure or a dressing. This information is not intended to replace advice given to you by your health care provider. Make sure you discuss any questions you have with your health care provider. Document Released: 03/17/2015 Document Revised: 10/15/2015 Document Reviewed: 05/18/2014 Elsevier Interactive  Patient Education  2018 [employer].     Follow-up plan: Return for suture removal 7-10 days (FYI for scheduler, can double-book) .  Visit summary with medication list and pertinent instructions was printed for patient to review, alert [place] if any changes needed. All questions at time of visit were answered - patient instructed to contact office with any additional concerns. ER/RTC precautions were reviewed with the patient and understanding verbalized.    Please note: voice recognition software was used to produce this document, and typos may escape review. Please contact Dr. Sheppard Coil for any needed clarifications.

## 2017-05-06 NOTE — Patient Instructions (Signed)
Skin Biopsy, Care After Refer to this sheet in the next few weeks. These instructions provide you with information about caring for yourself after your procedure. Your health care provider may also give you more specific instructions. Your treatment has been planned according to current medical practices, but problems sometimes occur. Call your health care provider if you have any problems or questions after your procedure. What can I expect after the procedure? After the procedure, it is common to have:  Soreness.  Bruising.  Itching.  Follow these instructions at home:  Rest and then return to your normal activities as told by your health care provider.  Take over-the-counter and prescription medicines only as told by your health care provider.  Follow instructions from your health care provider about how to take care of your biopsy site.Make sure you: ? Wash your hands with soap and water before you change your bandage (dressing). If soap and water are not available, use hand sanitizer. ? Change your dressing as told by your health care provider. ? Leave stitches (sutures), skin glue, or adhesive strips in place. These skin closures may need to stay in place for 2 weeks or longer. If adhesive strip edges start to loosen and curl up, you may trim the loose edges. Do not remove adhesive strips completely unless your health care provider tells you to do that. If the biopsy area bleeds, apply gentle pressure for 10 minutes.  Check your biopsy site every day for signs of infection. Check for: ? More redness, swelling, or pain. ? More fluid or blood. ? Warmth. ? Pus or a bad smell.  Keep all follow-up visits as told by your health care provider. This is important. Contact a health care provider if:  You have more redness, swelling, or pain around your biopsy site.  You have more fluid or blood coming from your biopsy site.  Your biopsy site feels warm to the touch.  You have pus or  a bad smell coming from your biopsy site.  You have a fever. Get help right away if:  You have bleeding that does not stop with pressure or a dressing. This information is not intended to replace advice given to you by your health care provider. Make sure you discuss any questions you have with your health care provider. Document Released: 03/17/2015 Document Revised: 10/15/2015 Document Reviewed: 05/18/2014 Elsevier Interactive Patient Education  2018 [employer]  

## 2017-05-14 ENCOUNTER — Ambulatory Visit: Payer: Medicare HMO | Admitting: Osteopathic Medicine

## 2017-05-14 ENCOUNTER — Encounter: Payer: Self-pay | Admitting: Osteopathic Medicine

## 2017-05-14 VITALS — BP 153/66 | HR 68 | Temp 98.4°F | Wt 152.1 lb

## 2017-05-14 DIAGNOSIS — E039 Hypothyroidism, unspecified: Secondary | ICD-10-CM | POA: Diagnosis not present

## 2017-05-14 DIAGNOSIS — Z4802 Encounter for removal of sutures: Secondary | ICD-10-CM | POA: Diagnosis not present

## 2017-05-14 DIAGNOSIS — D239 Other benign neoplasm of skin, unspecified: Secondary | ICD-10-CM

## 2017-05-14 LAB — TSH: TSH: 1.31 mIU/L (ref 0.40–4.50)

## 2017-05-14 NOTE — Progress Notes (Signed)
S: here for suture removal, no problems with biopsy site, has been a little itchy.  O: healing well, some pinkness to skin, no erythema A: suture removal P: Skin sterilized with alcohol and simple interrupted sutures removed x3 without difficulty, pt has reaction to bandages so none applied, advised keep area covered and clean, see me as needed

## 2017-05-15 ENCOUNTER — Other Ambulatory Visit: Payer: Self-pay | Admitting: Osteopathic Medicine

## 2017-05-15 ENCOUNTER — Ambulatory Visit: Payer: Medicare HMO | Admitting: Osteopathic Medicine

## 2017-05-15 MED ORDER — LEVOTHYROXINE SODIUM 75 MCG PO TABS: 75.0000 ug | ORAL_TABLET | Freq: Every day | ORAL | 1 refills | Status: DC

## 2017-05-15 NOTE — Progress Notes (Signed)
tsh ok

## 2017-05-21 ENCOUNTER — Encounter: Payer: Self-pay | Admitting: Osteopathic Medicine

## 2017-08-08 DIAGNOSIS — Z86010 Personal history of colonic polyps: Secondary | ICD-10-CM | POA: Diagnosis not present

## 2017-08-08 DIAGNOSIS — K5730 Diverticulosis of large intestine without perforation or abscess without bleeding: Secondary | ICD-10-CM | POA: Diagnosis not present

## 2017-08-08 LAB — HM COLONOSCOPY

## 2017-08-28 ENCOUNTER — Encounter: Payer: Self-pay | Admitting: Osteopathic Medicine

## 2017-09-02 ENCOUNTER — Encounter: Payer: Self-pay | Admitting: Osteopathic Medicine

## 2017-09-02 ENCOUNTER — Ambulatory Visit: Payer: Medicare HMO | Admitting: Osteopathic Medicine

## 2017-09-02 VITALS — BP 121/59 | HR 65 | Temp 98.1°F | Wt 156.0 lb

## 2017-09-02 DIAGNOSIS — E039 Hypothyroidism, unspecified: Secondary | ICD-10-CM | POA: Diagnosis not present

## 2017-09-02 DIAGNOSIS — H538 Other visual disturbances: Secondary | ICD-10-CM | POA: Diagnosis not present

## 2017-09-02 DIAGNOSIS — H5360 Unspecified night blindness: Secondary | ICD-10-CM

## 2017-09-02 NOTE — Progress Notes (Signed)
HPI: Natasha Wilson is a 67 y.o. female who  has a past medical history of Generalized anxiety disorder (05/24/2016), History of benign essential tremor (10/24/2015), and Thyroid activity decreased (11/10/2015).  she presents to [facility] today, 09/02/17,  for chief complaint of:  Blurred vision  Blurred vision coming and going, ongoing for the past few months, can't notice any pattern to this. When it occurs, can't read, can't see distance, then will go back to normal. Seems to be both eyes. Worst episode was morning after her colonoscopy prep.   She had an episode too of driving at night which she doesn't normally do, she noted "sparkling" around headlights, difficulty driving.   Wears contacts. Seeing her eye doctor in 10 days, just wanted an opinion here.      Past medical history, surgical history, and family history reviewed.  Current medication list and allergy/intolerance information reviewed.   (See remainder of HPI, ROS, Phys Exam below)  BP (!) 121/59 (BP Location: Left Arm, Patient Position: Sitting, Cuff Size: Normal)   Pulse 65   Temp 98.1 F (36.7 C) (Oral)   Wt 156 lb (70.8 kg)   BMI 26.78 kg/m      ASSESSMENT/PLAN:   Blurred vision, bilateral - transient issue, no pattern. Limited exam in office d/t nondilated exam. Would see ophtho/optometry for full eval, possiebl retinal issue?  - Plan: CBC, COMPLETE METABOLIC PANEL WITH GFR, TSH  Night vision loss - seems more likely cataract related, advised see ophtho/omtometry  - Plan: CBC, COMPLETE METABOLIC PANEL WITH GFR, TSH    Patient Instructions  Would advise follow-up with your eye doctor ASAP - I can call them to see if we can move your appointment up to an earlier date   Patient was advised that if there is vision loss, especially asymmetrical, especially if darkness or curtain closing kind of sypmtoms, she needs to go to the nearest hospital ASAP.  Patient verbalizes  understanding.  Follow-up plan: Return if symptoms worsen or fail to improve.     ############################################ ############################################ ############################################ ############################################    Outpatient Encounter Medications as of 09/02/2017  Medication Sig  . levothyroxine (SYNTHROID, LEVOTHROID) 75 MCG tablet Take 1 tablet (75 mcg total) by mouth daily before breakfast.  . ciclopirox (PENLAC) 8 % solution Apply over nail and surrounding skin qhs. Apply daily over previous coat. After seven (7) days, remove with alcohol and continue. (Patient not taking: Reported on 09/02/2017)   No facility-administered encounter medications on file as of 09/02/2017.    Allergies  Allergen Reactions  . Cefuroxime     Red splotches on face.      Review of Systems:  Constitutional: No recent illness  HEENT: No  headache, +vision change  Cardiac: No  chest pain, No  pressure, No palpitations  Respiratory:  No  shortness of breath. No  Cough  Neurologic: No  weakness, No  Dizziness   Exam:  BP (!) 121/59 (BP Location: Left Arm, Patient Position: Sitting, Cuff Size: Normal)   Pulse 65   Temp 98.1 F (36.7 C) (Oral)   Wt 156 lb (70.8 kg)   BMI 26.78 kg/m   Constitutional: VS see above. General Appearance: alert, well-developed, well-nourished, NAD  Eyes: Normal lids and conjunctive, non-icteric sclera, EOMI, PERRL, looks like cataract bilaterally, minited funduscopic exam appears normal, vision screen ok - see nurse notes  Ears, Nose, Mouth, Throat: MMM, Normal external inspection ears/nares/mouth/lips/gums.  Neck: No masses, trachea midline.   Respiratory: Normal  respiratory effort. no wheeze, no rhonchi, no rales  Cardiovascular: S1/S2 normal, no murmur, no rub/gallop auscultated. RRR.   Musculoskeletal: Gait normal. Symmetric and independent movement of all extremities  Neurological: Normal  balance/coordination. No tremor.  Skin: warm, dry, intact.   Psychiatric: Normal judgment/insight. Normal mood and affect. Oriented x3.   Visit summary with medication list and pertinent instructions was printed for patient to review, advised to alert [place] if any changes needed. All questions at time of visit were answered - patient instructed to contact office with any additional concerns. ER/RTC precautions were reviewed with the patient and understanding verbalized.   Follow-up plan: Return if symptoms worsen or fail to improve.  Note: Total time spent 25 minutes, greater than 50% of the visit was spent face-to-face counseling and coordinating care for the following: The primary encounter diagnosis was Blurred vision, bilateral. A diagnosis of Night vision loss was also pertinent to this visit.Marland Kitchen  Please note: voice recognition software was used to produce this document, and typos may escape review. Please contact Dr. Sheppard Coil for any needed clarifications.

## 2017-09-02 NOTE — Patient Instructions (Signed)
Would advise follow-up with your eye doctor ASAP - I can call them to see if we can move your appointment up to an earlier date

## 2017-09-03 ENCOUNTER — Encounter: Payer: Self-pay | Admitting: Osteopathic Medicine

## 2017-09-03 LAB — CBC
HEMATOCRIT: 39.7 % (ref 35.0–45.0)
Hemoglobin: 13.2 g/dL (ref 11.7–15.5)
MCH: 26.7 pg — ABNORMAL LOW (ref 27.0–33.0)
MCHC: 33.2 g/dL (ref 32.0–36.0)
MCV: 80.4 fL (ref 80.0–100.0)
MPV: 9.7 fL (ref 7.5–12.5)
Platelets: 254 10*3/uL (ref 140–400)
RBC: 4.94 10*6/uL (ref 3.80–5.10)
RDW: 13.5 % (ref 11.0–15.0)
WBC: 8.3 10*3/uL (ref 3.8–10.8)

## 2017-09-03 LAB — COMPLETE METABOLIC PANEL WITH GFR
AG RATIO: 1.6 (calc) (ref 1.0–2.5)
ALBUMIN MSPROF: 4.3 g/dL (ref 3.6–5.1)
ALT: 41 U/L — AB (ref 6–29)
AST: 55 U/L — ABNORMAL HIGH (ref 10–35)
Alkaline phosphatase (APISO): 89 U/L (ref 33–130)
BILIRUBIN TOTAL: 0.6 mg/dL (ref 0.2–1.2)
BUN: 11 mg/dL (ref 7–25)
CALCIUM: 9.8 mg/dL (ref 8.6–10.4)
CHLORIDE: 103 mmol/L (ref 98–110)
CO2: 28 mmol/L (ref 20–32)
Creat: 0.92 mg/dL (ref 0.50–0.99)
GFR, EST NON AFRICAN AMERICAN: 65 mL/min/{1.73_m2} (ref >=60)
GFR, Est African American: 75 mL/min/{1.73_m2} (ref >=60)
Globulin: 2.7 g/dL (calc) (ref 1.9–3.7)
Glucose, Bld: 91 mg/dL (ref 65–99)
POTASSIUM: 4.1 mmol/L (ref 3.5–5.3)
Sodium: 140 mmol/L (ref 135–146)
TOTAL PROTEIN: 7 g/dL (ref 6.1–8.1)

## 2017-09-03 LAB — TSH: TSH: 1.23 mIU/L (ref 0.40–4.50)

## 2017-09-09 ENCOUNTER — Encounter: Payer: Self-pay | Admitting: Osteopathic Medicine

## 2017-09-15 DIAGNOSIS — H16223 Keratoconjunctivitis sicca, not specified as Sjogren's, bilateral: Secondary | ICD-10-CM | POA: Diagnosis not present

## 2017-09-15 DIAGNOSIS — H2513 Age-related nuclear cataract, bilateral: Secondary | ICD-10-CM | POA: Diagnosis not present

## 2017-09-15 DIAGNOSIS — H527 Unspecified disorder of refraction: Secondary | ICD-10-CM | POA: Diagnosis not present

## 2017-09-29 DIAGNOSIS — H16223 Keratoconjunctivitis sicca, not specified as Sjogren's, bilateral: Secondary | ICD-10-CM | POA: Diagnosis not present

## 2017-09-29 DIAGNOSIS — H527 Unspecified disorder of refraction: Secondary | ICD-10-CM | POA: Diagnosis not present

## 2017-09-29 DIAGNOSIS — H2513 Age-related nuclear cataract, bilateral: Secondary | ICD-10-CM | POA: Diagnosis not present

## 2017-10-08 DIAGNOSIS — H16223 Keratoconjunctivitis sicca, not specified as Sjogren's, bilateral: Secondary | ICD-10-CM | POA: Diagnosis not present

## 2017-10-08 DIAGNOSIS — H2513 Age-related nuclear cataract, bilateral: Secondary | ICD-10-CM | POA: Diagnosis not present

## 2017-10-08 DIAGNOSIS — H527 Unspecified disorder of refraction: Secondary | ICD-10-CM | POA: Diagnosis not present

## 2017-10-21 ENCOUNTER — Other Ambulatory Visit: Payer: Self-pay | Admitting: Osteopathic Medicine

## 2017-10-21 DIAGNOSIS — R748 Abnormal levels of other serum enzymes: Secondary | ICD-10-CM

## 2017-10-21 LAB — COMPLETE METABOLIC PANEL WITH GFR
AG RATIO: 1.5 (calc) (ref 1.0–2.5)
ALBUMIN MSPROF: 4.1 g/dL (ref 3.6–5.1)
ALT: 25 U/L (ref 6–29)
AST: 29 U/L (ref 10–35)
Alkaline phosphatase (APISO): 80 U/L (ref 33–130)
BILIRUBIN TOTAL: 0.5 mg/dL (ref 0.2–1.2)
BUN: 12 mg/dL (ref 7–25)
CO2: 25 mmol/L (ref 20–32)
Calcium: 9.6 mg/dL (ref 8.6–10.4)
Chloride: 104 mmol/L (ref 98–110)
Creat: 0.71 mg/dL (ref 0.50–0.99)
GFR, EST AFRICAN AMERICAN: 102 mL/min/{1.73_m2} (ref >=60)
GFR, Est Non African American: 88 mL/min/{1.73_m2} (ref >=60)
Globulin: 2.8 g/dL (calc) (ref 1.9–3.7)
Glucose, Bld: 101 mg/dL — ABNORMAL HIGH (ref 65–99)
POTASSIUM: 4.2 mmol/L (ref 3.5–5.3)
Sodium: 140 mmol/L (ref 135–146)
TOTAL PROTEIN: 6.9 g/dL (ref 6.1–8.1)

## 2017-10-21 NOTE — Progress Notes (Signed)
Lab order placed on last result note.

## 2017-10-24 ENCOUNTER — Other Ambulatory Visit: Payer: Self-pay | Admitting: Osteopathic Medicine

## 2017-10-24 DIAGNOSIS — Z1231 Encounter for screening mammogram for malignant neoplasm of breast: Secondary | ICD-10-CM

## 2017-10-30 ENCOUNTER — Encounter: Payer: Self-pay | Admitting: Osteopathic Medicine

## 2017-10-31 NOTE — Telephone Encounter (Signed)
Labs did not cross over to EMR. Printed and placed in box for review.

## 2017-10-31 NOTE — Telephone Encounter (Signed)
Patient had labs done 10/21/2017, CMP to follow-up liver enzymes, I do not see anything in the system.  Can we call the lab and see what is going on?

## 2017-11-05 ENCOUNTER — Other Ambulatory Visit: Payer: Self-pay | Admitting: Radiology

## 2017-12-04 ENCOUNTER — Ambulatory Visit (INDEPENDENT_AMBULATORY_CARE_PROVIDER_SITE_OTHER): Payer: Medicare HMO

## 2017-12-04 DIAGNOSIS — Z1231 Encounter for screening mammogram for malignant neoplasm of breast: Secondary | ICD-10-CM | POA: Diagnosis not present

## 2017-12-05 ENCOUNTER — Ambulatory Visit: Payer: Medicare HMO

## 2017-12-15 ENCOUNTER — Encounter: Payer: Self-pay | Admitting: *Deleted

## 2017-12-15 ENCOUNTER — Other Ambulatory Visit: Payer: Self-pay

## 2017-12-15 ENCOUNTER — Emergency Department (INDEPENDENT_AMBULATORY_CARE_PROVIDER_SITE_OTHER)
Admission: EM | Admit: 2017-12-15 | Discharge: 2017-12-15 | Disposition: A | Discharge status: Home / Self Care | Payer: Medicare HMO | Source: Home / Self Care | Attending: Family Medicine | Admitting: Family Medicine

## 2017-12-15 DIAGNOSIS — S61412A Laceration without foreign body of left hand, initial encounter: Secondary | ICD-10-CM | POA: Diagnosis not present
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
## 2017-12-15 NOTE — ED Triage Notes (Signed)
Pt c/o laceration to her LT hand x 1900. She cut it on a piece of glass while washing dishes. No OTC meds. Tdap 2018.

## 2017-12-18 ENCOUNTER — Other Ambulatory Visit: Payer: Self-pay | Admitting: Osteopathic Medicine

## 2017-12-18 NOTE — Telephone Encounter (Signed)
Synthroid refill request.  Seen at [facility] not [facility]

## 2017-12-19 NOTE — Telephone Encounter (Signed)
[facility] requesting med refill for levothyroxine. Pt last thyroid check was 09/02/17, results were in normal range. Pls advise if refill appropriate, thanks.

## 2017-12-22 NOTE — Telephone Encounter (Signed)
Pt has been updated. No other inquiries during call.  

## 2017-12-22 NOTE — Telephone Encounter (Signed)
Attempted to contact patient, voicemail box not "set up". Unable to leave a vm msg for pt.

## 2017-12-24 ENCOUNTER — Encounter: Payer: Self-pay | Admitting: *Deleted

## 2017-12-24 ENCOUNTER — Emergency Department
Admission: EM | Admit: 2017-12-24 | Discharge: 2017-12-24 | Disposition: A | Discharge status: Home / Self Care | Payer: Medicare HMO | Source: Home / Self Care

## 2017-12-24 ENCOUNTER — Other Ambulatory Visit: Payer: Self-pay

## 2017-12-24 NOTE — ED Triage Notes (Signed)
Pt is here today for suture removal from her LT hand.

## 2018-01-02 ENCOUNTER — Encounter: Payer: Self-pay | Admitting: Osteopathic Medicine

## 2018-01-02 ENCOUNTER — Ambulatory Visit (INDEPENDENT_AMBULATORY_CARE_PROVIDER_SITE_OTHER): Payer: 59 | Admitting: Osteopathic Medicine

## 2018-01-02 VITALS — BP 150/70 | HR 67 | Temp 98.1°F | Wt 158.4 lb

## 2018-01-02 DIAGNOSIS — E039 Hypothyroidism, unspecified: Secondary | ICD-10-CM | POA: Diagnosis not present

## 2018-01-02 DIAGNOSIS — F411 Generalized anxiety disorder: Secondary | ICD-10-CM

## 2018-01-02 DIAGNOSIS — G252 Other specified forms of tremor: Secondary | ICD-10-CM

## 2018-01-02 DIAGNOSIS — Z0000 Encounter for general adult medical examination without abnormal findings: Secondary | ICD-10-CM | POA: Diagnosis not present

## 2018-01-02 MED ORDER — PROPRANOLOL HCL ER 60 MG PO CP24: 60.0000 mg | ORAL_CAPSULE | Freq: Every day | ORAL | 0 refills | Status: DC

## 2018-01-02 NOTE — Patient Instructions (Addendum)
Tremor:  Will try long-acting (once per day) beta blocker to help tremor and blood pressure  If this causes severe fatigue, can switch to as-needed tremor medicine and consider alternative blood pressure medicine   Will plan to recheck thyroid levels in 6 weeks after being on medicine for a bit

## 2018-01-02 NOTE — Progress Notes (Signed)
HPI: Natasha Wilson is a 67 y.o. female who  has a past medical history of Generalized anxiety disorder (05/24/2016), History of benign essential tremor (10/24/2015), and Thyroid activity decreased (11/10/2015).  she presents to [facility] today, 01/02/18,  for chief complaint of: Tremor   Hand shaking, trouble holding objects.  Has been going on in the left hand for years but noticing it getting a bit worse in the right.  Does not seem to happen at rest.  Her handwriting has become more messy but not smaller.  No gait changes.  No speech changes.  Notices anxiety makes the tremor worse.   BP Readings from Last 3 Encounters:  01/02/18 (!) 150/70  12/24/17 (!) 162/91  12/15/17 (!) 187/70     Past medical, surgical, social and family history reviewed:  Patient Active Problem List   Diagnosis Date Noted  . Onychomycosis 11/01/2016  . History of chronic eczema 11/01/2016  . Generalized anxiety disorder 05/24/2016  . S/P colonoscopy 12/20/2015  . Thyroid activity decreased 11/10/2015  . Relationship problem between parent and child 10/24/2015  . Annual physical exam 10/24/2015  . History of benign essential tremor 10/24/2015  . History of hysterectomy for benign disease 10/24/2015    Past Surgical History:  Procedure Laterality Date  . ABDOMINAL HYSTERECTOMY    . BREAST CYST EXCISION    . CHOLECYSTECTOMY      Social History   Tobacco Use  . Smoking status: Former Smoker    Last attempt to quit: 1995    Years since quitting: 24.8  . Smokeless tobacco: Never Used  Substance Use Topics  . Alcohol use: Never    Frequency: Never    Family History  Problem Relation Age of Onset  . Alcohol abuse Father   . Heart attack Father   . Cancer Sister        LUNG  . Depression Paternal Grandfather      Current medication list and allergy/intolerance information reviewed:    Current Outpatient Medications  Medication Sig Dispense Refill  .  levothyroxine (SYNTHROID, LEVOTHROID) 75 MCG tablet TAKE 1 TABLET BY MOUTH ONCE DAILY BEFORE BREAKFAST. 90 tablet 1   No current facility-administered medications for this visit.     Allergies  Allergen Reactions  . Cefuroxime     Red splotches on face.      Review of Systems:  Constitutional:  No  fever, no chills, No recent illness, No unintentional weight changes. No significant fatigue.   HEENT: No  headache, no vision change  Cardiac: No  chest pain, No  pressure, No palpitations  Respiratory:  No  shortness of breath. No  Cough  Gastrointestinal: No  abdominal pain, No  nausea, No  vomiting,  No  blood in stool, No  diarrhea, No  constipation   Musculoskeletal: No new myalgia/arthralgia  Skin: No  Rash, No other wounds/concerning lesions  Neurologic: No  weakness, No  dizziness, No  slurred speech/focal weakness/facial droop, +tremor as per HPI   Psychiatric: No  concerns with depression, +concerns with anxiety, No sleep problems, No mood problems  Exam:  BP (!) 150/70 (BP Location: Left Arm, Patient Position: Sitting, Cuff Size: Normal)   Pulse 67   Temp 98.1 F (36.7 C) (Oral)   Wt 158 lb 6.4 oz (71.8 kg)   BMI 27.19 kg/m   Constitutional: VS see above. General Appearance: alert, well-developed, well-nourished, NAD  Eyes: Normal lids and conjunctive, non-icteric sclera  Ears, Nose, Mouth, Throat:  MMM, Normal external inspection ears/nares/mouth/lips/gums.   Neck: No masses, trachea midline. No thyroid enlargement  Respiratory: Normal respiratory effort. no wheeze, no rhonchi, no rales  Cardiovascular: S1/S2 normal, no murmur, no rub/gallop auscultated. RRR. No lower extremity edema.   Musculoskeletal: Gait normal. No clubbing/cyanosis of digits.   Neurological: Normal balance/coordination. +tremor. No cranial nerve deficit on limited exam. Motor and sensation intact and symmetric. Cerebellar reflexes intact.   Tremor evaluation:  Tremor location:  hands  Tremor at rest? no  Tremor with activity? yes  Parkinson's traits: shuffling gait, head/jaw tremor, leg tremor, tremor worse with distraction (name months backwards) - no  Writing  ET: large, wobbly - evident  PD:  micrographia - none  Skin: warm, dry, intact. No rash/ulcer. No concerning nevi or subq nodules on limited exam.    Psychiatric: Normal judgment/insight. Normal mood and affect. Oriented x3.       ASSESSMENT/PLAN:   Action tremor - Plan: CBC, COMPLETE METABOLIC PANEL WITH GFR, TSH  Hypothyroidism, unspecified type - Plan: CBC, COMPLETE METABOLIC PANEL WITH GFR, TSH  Generalized anxiety disorder - Plan: CBC, COMPLETE METABOLIC PANEL WITH GFR, TSH  Annual physical exam - labs ordered for future visit - Plan: CBC, COMPLETE METABOLIC PANEL WITH GFR, TSH    Patient Instructions  Tremor:  Will try long-acting (once per day) beta blocker to help tremor and blood pressure  If this causes severe fatigue, can switch to as-needed tremor medicine and consider alternative blood pressure medicine   Will plan to recheck thyroid levels in 6 weeks after being on medicine for a bit    Visit summary with medication list and pertinent instructions was printed for patient to review. All questions at time of visit were answered - patient instructed to contact office with any additional concerns. ER/RTC precautions were reviewed with the patient.   Follow-up plan: Return in about 6 weeks (around 02/13/2018) for medicare wellness visit with Maudie Mercury - recheck w/ Dr A same day re: tremor .     Please note: voice recognition software was used to produce this document, and typos may escape review. Please contact Dr. Sheppard Coil for any needed clarifications.

## 2018-01-03 ENCOUNTER — Encounter: Payer: Self-pay | Admitting: Osteopathic Medicine

## 2018-01-03 LAB — COMPLETE METABOLIC PANEL WITH GFR
AG Ratio: 1.5 (calc) (ref 1.0–2.5)
ALBUMIN MSPROF: 4.1 g/dL (ref 3.6–5.1)
ALKALINE PHOSPHATASE (APISO): 81 U/L (ref 33–130)
ALT: 33 U/L — ABNORMAL HIGH (ref 6–29)
AST: 35 U/L (ref 10–35)
BUN: 8 mg/dL (ref 7–25)
CO2: 25 mmol/L (ref 20–32)
CREATININE: 0.74 mg/dL (ref 0.50–0.99)
Calcium: 9.9 mg/dL (ref 8.6–10.4)
Chloride: 104 mmol/L (ref 98–110)
GFR, Est African American: 97 mL/min/{1.73_m2} (ref >=60)
GFR, Est Non African American: 84 mL/min/{1.73_m2} (ref >=60)
GLUCOSE: 88 mg/dL (ref 65–139)
Globulin: 2.8 g/dL (calc) (ref 1.9–3.7)
Potassium: 4.1 mmol/L (ref 3.5–5.3)
Sodium: 140 mmol/L (ref 135–146)
Total Bilirubin: 0.5 mg/dL (ref 0.2–1.2)
Total Protein: 6.9 g/dL (ref 6.1–8.1)

## 2018-01-03 LAB — CBC
HCT: 39.7 % (ref 35.0–45.0)
HEMOGLOBIN: 13.2 g/dL (ref 11.7–15.5)
MCH: 26.5 pg — ABNORMAL LOW (ref 27.0–33.0)
MCHC: 33.2 g/dL (ref 32.0–36.0)
MCV: 79.7 fL — ABNORMAL LOW (ref 80.0–100.0)
MPV: 10.3 fL (ref 7.5–12.5)
Platelets: 265 10*3/uL (ref 140–400)
RBC: 4.98 10*6/uL (ref 3.80–5.10)
RDW: 13 % (ref 11.0–15.0)
WBC: 7.7 10*3/uL (ref 3.8–10.8)

## 2018-01-03 LAB — TSH: TSH: 1.54 m[IU]/L (ref 0.40–4.50)

## 2018-01-05 ENCOUNTER — Telehealth: Payer: Self-pay | Admitting: Osteopathic Medicine

## 2018-01-05 ENCOUNTER — Other Ambulatory Visit: Payer: Self-pay | Admitting: Osteopathic Medicine

## 2018-01-05 NOTE — Telephone Encounter (Signed)
error 

## 2018-02-09 NOTE — Progress Notes (Signed)
Subjective:   Natasha Wilson is a 67 y.o. female who presents for an Initial Medicare Annual Wellness Visit.  Review of Systems    No ROS.  Medicare Wellness Visit. Additional risk factors are reflected in the social history.     Sleep patterns: getting about 4 hours of sleep a night but not deep sleep. Wakes up 1 times during night to go the bathroom.When wakes up depending on how amany hours of sleep she gets will feel rested. Discussed trying Melatonin to help her with sleep and she agrees to try this to get off of the [facility] Safety/Smoke Alarms: Feels safe in home. Smoke alarms in place. Lives alone in 1 story home. Stairs do have handrails in place. Shower is a step over shower and grab bar is in place. Living environment;  Lives alone in 1 story home. Stairs do have handrails in place. Shower is a step over shower and grab bar is in place.  Seat Belt Safety/Bike Helmet: Wears seat belt.   Female:   Pap- aged out unless necessary      Mammo-  utd    Dexa scan- utd       CCS- utd     Objective:    There were no vitals filed for this visit. There is no height or weight on file to calculate BMI.  Advanced Directives 02/16/2018 10/24/2015  Does Patient Have a Medical Advance Directive? Yes No  Type of Paramedic of [place];Living will -  Does patient want to make changes to medical advance directive? Yes (MAU/Ambulatory/Procedural Areas - Information given) -  Copy of [place] in Chart? No - copy requested -  Would patient like information on creating a medical advance directive? - Yes - Educational materials given    Current Medications (verified) Outpatient Encounter Medications as of 02/16/2018  Medication Sig  . levothyroxine (SYNTHROID, LEVOTHROID) 75 MCG tablet TAKE 1 TABLET BY MOUTH ONCE DAILY BEFORE BREAKFAST.  Marland Kitchen propranolol ER (INDERAL LA) 60 MG 24 hr capsule Take 1 capsule (60 mg total) by mouth daily. For tremor and  blood pressure (Patient not taking: Reported on 02/16/2018)   No facility-administered encounter medications on file as of 02/16/2018.     Allergies (verified) Cefuroxime   History: Past Medical History:  Diagnosis Date  . Generalized anxiety disorder 05/24/2016  . History of benign essential tremor 10/24/2015  . Thyroid activity decreased 11/10/2015   Past Surgical History:  Procedure Laterality Date  . ABDOMINAL HYSTERECTOMY    . BREAST CYST EXCISION    . CHOLECYSTECTOMY     Family History  Problem Relation Age of Onset  . Alcohol abuse Father   . Heart attack Father   . Cancer Sister        LUNG  . Depression Paternal Grandfather    Social History   Socioeconomic History  . Marital status: Not on file    Spouse name: Not on file  . Number of children: 2  . Years of education: 58  . Highest education level: Associate degree: academic program  Occupational History  . Occupation: retired    Comment: [employer]  . Financial resource strain: Not hard at all  . Food insecurity:    Worry: Never true    Inability: Never true  . Transportation needs:    Medical: No    Non-medical: No  Tobacco Use  . Smoking status: Former Smoker    Last attempt to  quit: 1995    Years since quitting: 24.9  . Smokeless tobacco: Never Used  Substance and Sexual Activity  . Alcohol use: Yes    Alcohol/week: 4.0 standard drinks    Types: 4 Glasses of wine per week    Frequency: Never    Comment: 4 ounces a night  . Drug use: Never  . Sexual activity: Not Currently  Lifestyle  . Physical activity:    Days per week: 3 days    Minutes per session: 90 min  . Stress: Not at all  Relationships  . Social connections:    Talks on phone: Once a week    Gets together: Once a week    Attends religious service: More than 4 times per year    Active member of club or organization: Yes    Attends meetings of clubs or organizations: 1 to 4 times per year    Relationship  status: Divorced  Other Topics Concern  . Not on file  Social History Narrative   Runs errands. Watch TV. Reads a lot and plays games on the computer. Tuesdays are Bible study night. 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 Watches grandson some during the week as well.     Tobacco Counseling Counseling given: Not Answered   Clinical Intake:  Pre-visit preparation completed: Yes  Pain : No/denies pain     Nutritional Risks: None Diabetes: No  How often do you need to have someone help you when you read instructions, pamphlets, or other written materials from your doctor or pharmacy?: 1 - Never What is the last grade level you completed in school?: 14  Interpreter Needed?: No  Information entered by :: Natasha Dakin, LPN   Activities of Daily Living In your present state of health, do you have any difficulty performing the following activities: 02/16/2018  Hearing? N  Vision? N  Difficulty concentrating or making decisions? N  Walking or climbing stairs? N  Dressing or bathing? N  Doing errands, shopping? N  Some recent data might be hidden     Immunizations and Health Maintenance Immunization History  Administered Date(s) Administered  . Tdap 02/08/2006, 11/01/2016   Health Maintenance Due  Topic Date Due  . INFLUENZA VACCINE  10/02/2017    Patient Care Team: Natasha Reeve, DO as PCP - General (Osteopathic Medicine)  Indicate any recent Medical Services you may have received from other than [facility] providers in the past year (date may be approximate).     Assessment:   This is a routine wellness examination for Natasha Wilson.Physical assessment deferred to PCP.   Hearing/Vision screen  Visual Acuity Screening   Right eye Left eye Both eyes  Without correction:     With correction: 20/30 20/40 20/25   Hearing Screening Comments: Whisper test done and patient reported back all 3 words given correctly  Dietary issues and exercise activities  discussed: Current Exercise Habits: Structured exercise class, Type of exercise: walking, Time (Minutes): 60, Frequency (Times/Week): 3, Weekly Exercise (Minutes/Week): 180, Intensity: Mild, Exercise limited by: None identified Diet Eats healthy Breakfast:wheat toast with egg Lunch: meat and vegetables late Dinner:   Popcorn and fruit    Goals    . Exercise 3x per week (30 min per time)     Increase walking to 3 times a week for at least 30 minutes a day      Depression Screen PHQ 2/9 Scores 02/16/2018 01/02/2018 11/01/2016 05/24/2016  PHQ - 2 Score 1 1 2 6   PHQ- 9 Score  1 8 8 21     Fall Risk Fall Risk  02/16/2018 11/01/2016  Falls in the past year? 0 No  Follow up Falls prevention discussed -    Is the patient's home free of loose throw rugs in walkways, pet beds, electrical cords, etc?   yes      Grab bars in the bathroom? yes      Handrails on the stairs?   yes      Adequate lighting?   yes  Cognitive Function:     6CIT Screen 02/16/2018  What Year? 0 points  What month? 0 points  What time? 0 points  Count back from 20 0 points  Months in reverse 0 points  Repeat phrase 0 points  Total Score 0    Screening Tests Health Maintenance  Topic Date Due  . INFLUENZA VACCINE  10/02/2017  . PNA vac Low Risk Adult (1 of 2 - PCV13) 03/04/2018 (Originally 10/18/2015)  . MAMMOGRAM  12/05/2019  . COLONOSCOPY  08/08/2020  . TETANUS/TDAP  11/02/2026  . DEXA SCAN  Completed  . Hepatitis C Screening  Completed      Plan:    Ms. Altidor , Thank you for taking time to come for your Medicare Wellness Visit. I appreciate your ongoing commitment to your health goals. Please review the following plan we discussed and let me know if I can assist you in the future.   These are the goals we discussed: Goals    . Exercise 3x per week (30 min per time)     Increase walking to 3 times a week for at least 30 minutes a day       This is a list of the screening recommended for you and  due dates:  Health Maintenance  Topic Date Due  . Flu Shot  10/02/2017  . Pneumonia vaccines (1 of 2 - PCV13) 03/04/2018*  . Mammogram  12/05/2019  . Colon Cancer Screening  08/08/2020  . Tetanus Vaccine  11/02/2026  . DEXA scan (bone density measurement)  Completed  .  Hepatitis C: One time screening is recommended by Center for Disease Control  (CDC) for  adults born from [birth year] through 1965.   Completed  *Topic was postponed. The date shown is not the original due date.   Please schedule your next medicare wellness visit with me in 1 yr.  Continue doing brain stimulating activities (puzzles, reading, adult coloring books, staying active) to keep memory sharp.    I have personally reviewed and noted the following in the patient's chart:   . Medical and social history . Use of alcohol, tobacco or illicit drugs  . Current medications and supplements . Functional ability and status . Nutritional status . Physical activity . Advanced directives . List of other physicians . Hospitalizations, surgeries, and ER visits in previous 12 months . Vitals . Screenings to include cognitive, depression, and falls . Referrals and appointments  In addition, I have reviewed and discussed with patient certain preventive protocols, quality metrics, and best practice recommendations. A written personalized care plan for preventive services as well as general preventive health recommendations were provided to patient.     Joanne Chars, LPN   [id number]

## 2018-02-16 ENCOUNTER — Ambulatory Visit (INDEPENDENT_AMBULATORY_CARE_PROVIDER_SITE_OTHER): Payer: Medicare HMO | Admitting: *Deleted

## 2018-02-16 ENCOUNTER — Ambulatory Visit: Payer: 59 | Admitting: Osteopathic Medicine

## 2018-02-16 VITALS — BP 127/71 | HR 83 | Ht 64.0 in | Wt 156.0 lb

## 2018-02-16 DIAGNOSIS — Z0000 Encounter for general adult medical examination without abnormal findings: Secondary | ICD-10-CM | POA: Diagnosis not present

## 2018-02-16 NOTE — Patient Instructions (Addendum)
Natasha Wilson , Thank you for taking time to come for your Medicare Wellness Visit. I appreciate your ongoing commitment to your health goals. Please review the following plan we discussed and let me know if I can assist you in the future.   These are the goals we discussed: Goals    . Exercise 3x per week (30 min per time)     Increase walking to 3 times a week for at least 30 minutes a day    Please schedule your next medicare wellness visit with me in 1 yr.  Continue doing brain stimulating activities (puzzles, reading, adult coloring books, staying active) to keep memory shar Stress and Stress Management Stress is a normal reaction to life events. It is what you feel when life demands more than you are used to or more than you can handle. Some stress can be useful. For example, the stress reaction can help you catch the last bus of the day, study for a test, or meet a deadline at work. But stress that occurs too often or for too long can cause problems. It can affect your emotional health and interfere with relationships and normal daily activities. Too much stress can weaken your immune system and increase your risk for physical illness. If you already have a medical problem, stress can make it worse. What are the causes? All sorts of life events may cause stress. An event that causes stress for one person may not be stressful for another person. Major life events commonly cause stress. These may be positive or negative. Examples include losing your job, moving into a new home, getting married, having a baby, or losing a loved one. Less obvious life events may also cause stress, especially if they occur day after day or in combination. Examples include working long hours, driving in traffic, caring for children, being in debt, or being in a difficult relationship. What are the signs or symptoms? Stress may cause emotional symptoms including, the following:  Anxiety. This is feeling worried,  afraid, on edge, overwhelmed, or out of control.  Anger. This is feeling irritated or impatient.  Depression. This is feeling sad, down, helpless, or guilty.  Difficulty focusing, remembering, or making decisions.  Stress may cause physical symptoms, including the following:  Aches and pains. These may affect your head, neck, back, stomach, or other areas of your body.  Tight muscles or clenched jaw.  Low energy or trouble sleeping.  Stress may cause unhealthy behaviors, including the following:  Eating to feel better (overeating) or skipping meals.  Sleeping too little, too much, or both.  Working too much or putting off tasks (procrastination).  Smoking, drinking alcohol, or using drugs to feel better.  How is this diagnosed? Stress is diagnosed through an assessment by your health care provider. Your health care provider will ask questions about your symptoms and any stressful life events.Your health care provider will also ask about your medical history and may order blood tests or other tests. Certain medical conditions and medicine can cause physical symptoms similar to stress. Mental illness can cause emotional symptoms and unhealthy behaviors similar to stress. Your health care provider may refer you to a mental health professional for further evaluation. How is this treated? Stress management is the recommended treatment for stress.The goals of stress management are reducing stressful life events and coping with stress in healthy ways. Techniques for reducing stressful life events include the following:  Stress identification. Self-monitor for stress and identify what causes  stress for you. These skills may help you to avoid some stressful events.  Time management. Set your priorities, keep a calendar of events, and learn to say "no." These tools can help you avoid making too many commitments.  Techniques for coping with stress include the following:  Rethinking the  problem. Try to think realistically about stressful events rather than ignoring them or overreacting. Try to find the positives in a stressful situation rather than focusing on the negatives.  Exercise. Physical exercise can release both physical and emotional tension. The key is to find a form of exercise you enjoy and do it regularly.  Relaxation techniques. These relax the body and mind. Examples include yoga, meditation, tai chi, biofeedback, deep breathing, progressive muscle relaxation, listening to music, being out in nature, journaling, and other hobbies. Again, the key is to find one or more that you enjoy and can do regularly.  Healthy lifestyle. Eat a balanced diet, get plenty of sleep, and do not smoke. Avoid using alcohol or drugs to relax.  Strong support network. Spend time with family, friends, or other people you enjoy being around.Express your feelings and talk things over with someone you trust.  Counseling or talktherapy with a mental health professional may be helpful if you are having difficulty managing stress on your own. Medicine is typically not recommended for the treatment of stress.Talk to your health care provider if you think you need medicine for symptoms of stress. Follow these instructions at home:  Keep all follow-up visits as directed by your health care provider.  Take all medicines as directed by your health care provider. Contact a health care provider if:  Your symptoms get worse or you start having new symptoms.  You feel overwhelmed by your problems and can no longer manage them on your own. Get help right away if:  You feel like hurting yourself or someone else. This information is not intended to replace advice given to you by your health care provider. Make sure you discuss any questions you have with your health care provider. Document Released: 08/14/2000 Document Revised: 07/27/2015 Document Reviewed: 10/13/2012 Elsevier Interactive Patient  Education  2017 [employer].

## 2018-04-09 ENCOUNTER — Encounter: Payer: Self-pay | Admitting: Osteopathic Medicine

## 2018-04-14 ENCOUNTER — Encounter: Payer: Medicare HMO | Admitting: Osteopathic Medicine

## 2018-06-21 ENCOUNTER — Other Ambulatory Visit: Payer: Self-pay | Admitting: Osteopathic Medicine

## 2018-08-11 ENCOUNTER — Ambulatory Visit (INDEPENDENT_AMBULATORY_CARE_PROVIDER_SITE_OTHER): Payer: Medicare HMO | Admitting: Osteopathic Medicine

## 2018-08-11 ENCOUNTER — Encounter: Payer: Self-pay | Admitting: Osteopathic Medicine

## 2018-08-11 VITALS — BP 160/71 | HR 68 | Wt 160.0 lb

## 2018-08-11 DIAGNOSIS — N8110 Cystocele, unspecified: Secondary | ICD-10-CM

## 2018-08-11 DIAGNOSIS — N816 Rectocele: Secondary | ICD-10-CM

## 2018-08-11 DIAGNOSIS — N952 Postmenopausal atrophic vaginitis: Secondary | ICD-10-CM | POA: Diagnosis not present

## 2018-08-11 DIAGNOSIS — N814 Uterovaginal prolapse, unspecified: Secondary | ICD-10-CM | POA: Diagnosis present

## 2018-08-11 NOTE — Patient Instructions (Signed)
Pelvic Organ Prolapse Pelvic organ prolapse is the stretching, bulging, or dropping of pelvic organs into an abnormal position. It happens when the muscles and tissues that surround and support pelvic structures become weak or stretched. Pelvic organ prolapse can involve the:  Vagina (vaginal prolapse).  Uterus (uterine prolapse).  Bladder (cystocele).  Rectum (rectocele).  Intestines (enterocele). When organs other than the vagina are involved, they often bulge into the vagina or protrude from the vagina, depending on how severe the prolapse is. What are the causes? This condition may be caused by:  Pregnancy, labor, and childbirth.  Past pelvic surgery.  Decreased production of the hormone estrogen associated with menopause.  Consistently lifting more than 50 lb (23 kg).  Obesity.  Long-term inability to pass stool (chronic constipation).  A cough that lasts a long time (chronic).  Buildup of fluid in the abdomen due to certain diseases and other conditions. What are the signs or symptoms? Symptoms of this condition include:  Passing a little urine (loss of bladder control) when you cough, sneeze, strain, and exercise (stress incontinence). This may be worse immediately after childbirth. It may gradually improve over time.  Feeling pressure in your pelvis or vagina. This pressure may increase when you cough or when you are passing stool.  A bulge that protrudes from the opening of your vagina.  Difficulty passing urine or stool.  Pain in your lower back.  Pain, discomfort, or disinterest in sex.  Repeated bladder infections (urinary tract infections).  Difficulty inserting a tampon. In some people, this condition causes no symptoms. How is this diagnosed? This condition may be diagnosed based on a vaginal and rectal exam. During the exam, you may be asked to cough and strain while you are lying down, sitting, and standing up. Your health care provider will  determine if other tests are required, such as bladder function tests. How is this treated? Treatment for this condition may depend on your symptoms. Treatment may include:  Lifestyle changes, such as changes to your diet.  Emptying your bladder at scheduled times (bladder training therapy). This can help reduce or avoid urinary incontinence.  Estrogen. Estrogen may help mild prolapse by increasing the strength and tone of pelvic floor muscles.  Kegel exercises. These may help mild cases of prolapse by strengthening and tightening the muscles of the pelvic floor.  A soft, flexible device that helps support the vaginal walls and keep pelvic organs in place (pessary). This is inserted into your vagina by your health care provider.  Surgery. This is often the only form of treatment for severe prolapse. Follow these instructions at home:  Avoid drinking beverages that contain caffeine or alcohol.  Increase your intake of high-fiber foods. This can help decrease constipation and straining during bowel movements.  Lose weight if recommended by your health care provider.  Wear a sanitary pad or adult diapers if you have urinary incontinence.  Avoid heavy lifting and straining with exercise and work. Do not hold your breath when you perform mild to moderate lifting and exercise activities. Limit your activities as directed by your health care provider.  Do Kegel exercises as directed by your health care provider. To do this: ? Squeeze your pelvic floor muscles tight. You should feel a tight lift in your rectal area and a tightness in your vaginal area. Keep your stomach, buttocks, and legs relaxed. ? Hold the muscles tight for up to 10 seconds. ? Relax your muscles. ? Repeat this exercise 50 times a day,   or as many times as told by your health care provider. Continue to do this exercise for at least 4-6 weeks, or for as long as told by your health care provider.  Take over-the-counter and  prescription medicines only as told by your health care provider.  If you have a pessary, take care of it as told by your health care provider.  Keep all follow-up visits as told by your health care provider. This is important. Contact a health care provider if you:  Have symptoms that interfere with your daily activities or sex life.  Need medicine to help with the discomfort.  Notice bleeding from your vagina that is not related to your period.  Have a fever.  Have pain or bleeding when you urinate.  Have bleeding when you pass stool.  Pass urine when you have sex.  Have chronic constipation.  Have a pessary that falls out.  Have bad smelling vaginal discharge.  Have an unusual, low pain in your abdomen. Summary  Pelvic organ prolapse is the stretching, bulging, or dropping of pelvic organs into an abnormal position. It happens when the muscles and tissues that surround and support pelvic structures become weak or stretched.  When organs other than the vagina are involved, they often bulge into the vagina or protrude from the vagina, depending on how severe the prolapse is.  In most cases, this condition needs to be treated only if it produces symptoms. Treatment may include lifestyle changes, estrogen, Kegel exercises, pessary insertion, or surgery.  Avoid heavy lifting and straining with exercise and work. Do not hold your breath when you perform mild to moderate lifting and exercise activities. Limit your activities as directed by your health care provider. This information is not intended to replace advice given to you by your health care provider. Make sure you discuss any questions you have with your health care provider. Document Released: 09/15/2013 Document Revised: 03/12/2017 Document Reviewed: 03/12/2017 Elsevier Interactive Patient Education  2019 [employer]  

## 2018-08-11 NOTE — Progress Notes (Signed)
HPI: Natasha Wilson is a 68 y.o. female who  has a past medical history of Generalized anxiety disorder (05/24/2016), History of benign essential tremor (10/24/2015), and Thyroid activity decreased (11/10/2015).  she presents to [facility] today, 08/11/18,  for chief complaint of:  Vaginal pain and bulging worse w/ walking    . Location: vaginal  . Quality: feeling like a bulge . Severity: getting worse past couple weeks . Duration: 3 months or so . Timing: worse w/ standing up, walking   Assoc signs/symptoms: difficulty peeing sometimes, feels like not emptying. Vagina "feels like a desert down there." Patient is very anxious about this issue.         At today's visit 08/11/18 ... PMH, PSH, FH reviewed and updated as needed.  Current medication list and allergy/intolerance hx reviewed and updated as needed. 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 Diagnoses of Prolapse of anterior vaginal wall and Postmenopausal atrophic vaginitis were also pertinent to this visit.  Orders Placed This Encounter  Procedures  . Ambulatory referral to Obstetrics / Gynecology     No orders of the defined types were placed in this encounter.   Patient Instructions  Pelvic Organ Prolapse Pelvic organ prolapse is the stretching, bulging, or dropping of pelvic organs into an abnormal position. It happens when the muscles and tissues that surround and support pelvic structures become weak or stretched. Pelvic organ prolapse can involve the:  Vagina (vaginal prolapse).  Uterus (uterine prolapse).  Bladder (cystocele).  Rectum (rectocele).  Intestines (enterocele). When organs other than the vagina are involved, they often bulge into the vagina or protrude from the vagina, depending on how severe the prolapse is. What are the causes? This condition may be  caused by:  Pregnancy, labor, and childbirth.  Past pelvic surgery.  Decreased production of the hormone estrogen associated with menopause.  Consistently lifting more than 50 lb (23 kg).  Obesity.  Long-term inability to pass stool (chronic constipation).  A cough that lasts a long time (chronic).  Buildup of fluid in the abdomen due to certain diseases and other conditions. What are the signs or symptoms? Symptoms of this condition include:  Passing a little urine (loss of bladder control) when you cough, sneeze, strain, and exercise (stress incontinence). This may be worse immediately after childbirth. It may gradually improve over time.  Feeling pressure in your pelvis or vagina. This pressure may increase when you cough or when you are passing stool.  A bulge that protrudes from the opening of your vagina.  Difficulty passing urine or stool.  Pain in your lower back.  Pain, discomfort, or disinterest in sex.  Repeated bladder infections (urinary tract infections).  Difficulty inserting a tampon. In some people, this condition causes no symptoms. How is this diagnosed? This condition may be diagnosed based on a vaginal and rectal exam. During the exam, you may be asked to cough and strain while you are lying down, sitting, and standing up. Your health care provider will determine if other tests are required, such as bladder function tests. How is this treated? Treatment for this condition may depend on your symptoms. Treatment may include:  Lifestyle changes, such as changes to your diet.  Emptying your bladder at scheduled times (bladder training therapy). This can help reduce or avoid urinary incontinence.  Estrogen. Estrogen may help mild prolapse by increasing  the strength and tone of pelvic floor muscles.  Kegel exercises. These may help mild cases of prolapse by strengthening and tightening the muscles of the pelvic floor.  A soft, flexible device that helps  support the vaginal walls and keep pelvic organs in place (pessary). This is inserted into your vagina by your health care provider.  Surgery. This is often the only form of treatment for severe prolapse. Follow these instructions at home:  Avoid drinking beverages that contain caffeine or alcohol.  Increase your intake of high-fiber foods. This can help decrease constipation and straining during bowel movements.  Lose weight if recommended by your health care provider.  Wear a sanitary pad or adult diapers if you have urinary incontinence.  Avoid heavy lifting and straining with exercise and work. Do not hold your breath when you perform mild to moderate lifting and exercise activities. Limit your activities as directed by your health care provider.  Do Kegel exercises as directed by your health care provider. To do this: ? Squeeze your pelvic floor muscles tight. You should feel a tight lift in your rectal area and a tightness in your vaginal area. Keep your stomach, buttocks, and legs relaxed. ? Hold the muscles tight for up to 10 seconds. ? Relax your muscles. ? Repeat this exercise 50 times a day, or as many times as told by your health care provider. Continue to do this exercise for at least 4-6 weeks, or for as long as told by your health care provider.  Take over-the-counter and prescription medicines only as told by your health care provider.  If you have a pessary, take care of it as told by your health care provider.  Keep all follow-up visits as told by your health care provider. This is important. Contact a health care provider if you:  Have symptoms that interfere with your daily activities or sex life.  Need medicine to help with the discomfort.  Notice bleeding from your vagina that is not related to your period.  Have a fever.  Have pain or bleeding when you urinate.  Have bleeding when you pass stool.  Pass urine when you have sex.  Have chronic  constipation.  Have a pessary that falls out.  Have bad smelling vaginal discharge.  Have an unusual, low pain in your abdomen. Summary  Pelvic organ prolapse is the stretching, bulging, or dropping of pelvic organs into an abnormal position. It happens when the muscles and tissues that surround and support pelvic structures become weak or stretched.  When organs other than the vagina are involved, they often bulge into the vagina or protrude from the vagina, depending on how severe the prolapse is.  In most cases, this condition needs to be treated only if it produces symptoms. Treatment may include lifestyle changes, estrogen, Kegel exercises, pessary insertion, or surgery.  Avoid heavy lifting and straining with exercise and work. Do not hold your breath when you perform mild to moderate lifting and exercise activities. Limit your activities as directed by your health care provider. This information is not intended to replace advice given to you by your health care provider. Make sure you discuss any questions you have with your health care provider. Document Released: 09/15/2013 Document Revised: 03/12/2017 Document Reviewed: 03/12/2017 Elsevier Interactive Patient Education  2019 [employer].       Follow-up plan: Return if symptoms worsen or fail to improve.                                                 ################################################# ################################################# ################################################# #################################################  Current Meds  Medication Sig  . levothyroxine (SYNTHROID) 75 MCG tablet TAKE 1 TABLET BY MOUTH ONCE DAILY BEFORE BREAKFAST    Allergies  Allergen Reactions  . Cefuroxime     Red splotches on face.       Review of Systems:  Constitutional: No recent illness  HEENT: No  headache, no vision change  Cardiac: No   chest pain, No  pressure, No palpitations  Respiratory:  No  shortness of breath. No  Cough  Musculoskeletal: No new myalgia/arthralgia  Skin: No  Rash   Exam:  BP (!) 160/71   Pulse 68   Wt 160 lb (72.6 kg)   SpO2 95%   BMI 27.46 kg/m   Constitutional: VS see above. General Appearance: alert, well-developed, well-nourished, NAD  Eyes: Normal lids and conjunctive, non-icteric sclera  Neck: No masses, trachea midline.   Respiratory: Normal respiratory effort.   Musculoskeletal: Gait normal. Symmetric and independent movement of all extremities  Abdominal: non-tender, non-distended, no appreciable organomegaly, neg Murphy's, BS WNLx4  Neurological: Normal balance/coordination. No tremor.  Skin: warm, dry, intact.   Psychiatric: Normal judgment/insight. Normal mood and affect. Oriented x3.  GYN: No lesions/ulcers to external genitalia, normal urethra, atrophic vaginal mucosa, minimal physiologic discharge, cervix absent, uterus absent, adnexa no masses and nontender, some prolapse to anterior and posterior vaginal walls      Visit summary with medication list and pertinent instructions was printed for patient to review, patient was advised to alert [place] if any updates are needed. All questions at time of visit were answered - patient instructed to contact office with any additional concerns. ER/RTC precautions were reviewed with the patient and understanding verbalized.    Please note: voice recognition software was used to produce this document, and typos may escape review. Please contact Dr. Sheppard Coil for any needed clarifications.    Follow up plan: Return if symptoms worsen or fail to improve.

## 2018-09-08 ENCOUNTER — Encounter: Payer: Self-pay | Admitting: Osteopathic Medicine

## 2018-09-10 NOTE — Telephone Encounter (Signed)
Patient is scheduled with Dr. Hulan Fray - [facility]

## 2018-09-16 ENCOUNTER — Other Ambulatory Visit: Payer: Self-pay | Admitting: Osteopathic Medicine

## 2018-10-01 ENCOUNTER — Ambulatory Visit (INDEPENDENT_AMBULATORY_CARE_PROVIDER_SITE_OTHER): Payer: Medicare HMO | Admitting: Obstetrics & Gynecology

## 2018-10-01 ENCOUNTER — Other Ambulatory Visit: Payer: Self-pay

## 2018-10-01 ENCOUNTER — Encounter: Payer: Self-pay | Admitting: Obstetrics & Gynecology

## 2018-10-01 VITALS — BP 132/75 | HR 74 | Ht 64.0 in | Wt 159.0 lb

## 2018-10-01 DIAGNOSIS — N952 Postmenopausal atrophic vaginitis: Secondary | ICD-10-CM | POA: Diagnosis not present

## 2018-10-01 DIAGNOSIS — N3946 Mixed incontinence: Secondary | ICD-10-CM | POA: Diagnosis not present

## 2018-10-01 DIAGNOSIS — N8110 Cystocele, unspecified: Secondary | ICD-10-CM

## 2018-10-01 MED ORDER — INTRAROSA 6.5 MG VA INST: 1.0000 | VAGINAL_INSERT | Freq: Every day | VAGINAL | 12 refills | Status: DC

## 2018-10-01 NOTE — Progress Notes (Signed)
   Subjective:    Patient ID: Natasha Wilson, female    DOB: 02/16/1951, 68 y.o.   MRN: [id number]  HPI 68 yo single P2 (2 great grands also) here as a referral from Dr. Emeterio Reeve with cystocele. She reports that this has been present for about 3+ months but is annoying her more in the recent weeks. She has had mixed incontinence for about a year, denies nocturia. She has been abstinent for more than 10 years. She also complains of vaginal dryness that is very bothersome.  Review of Systems   She had an abdominal hysterectomy in the distant past. Objective:   Physical Exam Breathing, conversing, and ambulating normally  Well nourished, well hydrated White female, no apparent distress Abd- benign Vagina with severe atrophy 3rd degree cystocele     Assessment & Plan:  Mixed incontinence- refer to urology 3rd degree cystocele- offered pessary versus anterior repair Vaginal atrophy, symptomatic- intrarosa prescribed

## 2018-10-14 DIAGNOSIS — H5213 Myopia, bilateral: Secondary | ICD-10-CM | POA: Diagnosis not present

## 2018-10-14 DIAGNOSIS — H2513 Age-related nuclear cataract, bilateral: Secondary | ICD-10-CM | POA: Diagnosis not present

## 2018-10-26 ENCOUNTER — Other Ambulatory Visit: Payer: Self-pay | Admitting: Osteopathic Medicine

## 2018-10-26 DIAGNOSIS — Z1231 Encounter for screening mammogram for malignant neoplasm of breast: Secondary | ICD-10-CM

## 2018-11-24 DIAGNOSIS — N8111 Cystocele, midline: Secondary | ICD-10-CM | POA: Diagnosis not present

## 2018-11-24 DIAGNOSIS — N3946 Mixed incontinence: Secondary | ICD-10-CM | POA: Diagnosis not present

## 2018-11-25 ENCOUNTER — Encounter: Payer: Self-pay | Admitting: *Deleted

## 2018-12-09 ENCOUNTER — Other Ambulatory Visit: Payer: Self-pay

## 2018-12-09 ENCOUNTER — Ambulatory Visit (INDEPENDENT_AMBULATORY_CARE_PROVIDER_SITE_OTHER): Payer: Medicare HMO

## 2018-12-09 DIAGNOSIS — N3946 Mixed incontinence: Secondary | ICD-10-CM | POA: Diagnosis not present

## 2018-12-09 DIAGNOSIS — Z1231 Encounter for screening mammogram for malignant neoplasm of breast: Secondary | ICD-10-CM | POA: Diagnosis not present

## 2018-12-14 ENCOUNTER — Other Ambulatory Visit: Payer: Self-pay | Admitting: Osteopathic Medicine

## 2018-12-22 DIAGNOSIS — N8111 Cystocele, midline: Secondary | ICD-10-CM | POA: Diagnosis not present

## 2018-12-24 ENCOUNTER — Other Ambulatory Visit: Payer: Self-pay | Admitting: Urology

## 2018-12-25 ENCOUNTER — Other Ambulatory Visit: Payer: Self-pay | Admitting: Urology

## 2019-02-04 NOTE — Patient Instructions (Addendum)
DUE TO COVID-19 ONLY ONE VISITOR IS ALLOWED TO COME WITH YOU AND STAY IN THE WAITING ROOM ONLY DURING PRE OP AND PROCEDURE DAY OF SURGERY. THE 1 VISITOR MAY VISIT WITH YOU AFTER SURGERY IN YOUR PRIVATE ROOM DURING VISITING HOURS ONLY!  YOU NEED TO HAVE A COVID 19 TEST ON: 02/05/2019 , THIS TEST MUST BE DONE BEFORE SURGERY, COME  [place], [place] [place] , [id number].  ([place]) ONCE YOUR COVID TEST IS COMPLETED, PLEASE BEGIN THE QUARANTINE INSTRUCTIONS AS OUTLINED IN YOUR HANDOUT.     Your procedure is scheduled on: 02/09/2019   Report to [facility] Main  Entrance    Report to Admitting at: 5:30 AM     Call this number if you have problems the morning of surgery [phone]    Remember: Do not eat food or drink liquids :After Midnight.      Take these medicines the morning of surgery with A SIP OF WATER:Levothryoxine (Synthroid),  Lansoprazole (Prevacid), prn,  Benadryl, prn .   BRUSH YOUR TEETH MORNING OF SURGERY AND RINSE YOUR MOUTH OUT, NO CHEWING GUM CANDY OR MINTS.                                 You may not have any metal on your body including hair pins and              piercings     Do not wear jewelry, make-up, lotions, powders or perfumes, deodorant              Do not wear nail polish on your fingernails.  Do not shave  48 hours prior to surgery.      Do not bring valuables to the hospital. [place].  Contacts, dentures or bridgework may not be worn into surgery.     YOU MAY BRING A SMALL OVERNIGHT [facility] - Preparing for Surgery Before surgery, you can play an important role.  Because skin is not sterile, your skin needs to be as free of germs as possible.  You can reduce the number of germs on your skin by washing with CHG (chlorahexidine gluconate) soap before surgery.  CHG is an antiseptic cleaner which kills germs and bonds with the skin to continue killing germs even  after washing. Please DO NOT use if you have an allergy to CHG or antibacterial soaps.  If your skin becomes reddened/irritated stop using the CHG and inform your nurse when you arrive at Short Stay. Do not shave (including legs and underarms) for at least 48 hours prior to the first CHG shower.  You may shave your face/neck. Please follow these instructions carefully:  1.  Shower with CHG Soap the night before surgery and the  morning of Surgery.  2.  If you choose to wash your hair, wash your hair first as usual with your  normal  shampoo.  3.  After you shampoo, rinse your hair and body thoroughly to remove the  shampoo.                           4.  Use CHG as you would any other liquid soap.  You can apply chg directly  to the skin and wash  Gently with a scrungie or clean washcloth.  5.  Apply the CHG Soap to your body ONLY FROM THE NECK DOWN.   Do not use on face/ open                           Wound or open sores. Avoid contact with eyes, ears mouth and genitals (private parts).                       Wash face,  Genitals (private parts) with your normal soap.             6.  Wash thoroughly, paying special attention to the area where your surgery  will be performed.  7.  Thoroughly rinse your body with warm water from the neck down.  8.  DO NOT shower/wash with your normal soap after using and rinsing off  the CHG Soap.                9.  Pat yourself dry with a clean towel.            10.  Wear clean pajamas.            11.  Place clean sheets on your bed the night of your first shower and do not  sleep with pets. Day of Surgery : Do not apply any lotions/deodorants the morning of surgery.  Please wear clean clothes to the hospital/surgery center.  FAILURE TO FOLLOW THESE INSTRUCTIONS MAY RESULT IN THE CANCELLATION OF YOUR SURGERY PATIENT SIGNATURE_________________________________  NURSE  SIGNATURE__________________________________  ________________________________________________________________________  WHAT IS A BLOOD TRANSFUSION? Blood Transfusion Information  A transfusion is the replacement of blood or some of its parts. Blood is made up of multiple cells which provide different functions.  Red blood cells carry oxygen and are used for blood loss replacement.  White blood cells fight against infection.  Platelets control bleeding.  Plasma helps clot blood.  Other blood products are available for specialized needs, such as hemophilia or other clotting disorders. BEFORE THE TRANSFUSION  Who gives blood for transfusions?   Healthy volunteers who are fully evaluated to make sure their blood is safe. This is blood bank blood. Transfusion therapy is the safest it has ever been in the practice of medicine. Before blood is taken from a donor, a complete history is taken to make sure that person has no history of diseases nor engages in risky social behavior (examples are intravenous drug use or sexual activity with multiple partners). The donor's travel history is screened to minimize risk of transmitting infections, such as malaria. The donated blood is tested for signs of infectious diseases, such as HIV and hepatitis. The blood is then tested to be sure it is compatible with you in order to minimize the chance of a transfusion reaction. If you or a relative donates blood, this is often done in anticipation of surgery and is not appropriate for emergency situations. It takes many days to process the donated blood. RISKS AND COMPLICATIONS Although transfusion therapy is very safe and saves many lives, the main dangers of transfusion include:   Getting an infectious disease.  Developing a transfusion reaction. This is an allergic reaction to something in the blood you were given. Every precaution is taken to prevent this. The decision to have a blood transfusion has been  considered carefully by your caregiver before blood is given. Blood is not given unless the benefits  outweigh the risks. AFTER THE TRANSFUSION  Right after receiving a blood transfusion, you will usually feel much better and more energetic. This is especially true if your red blood cells have gotten low (anemic). The transfusion raises the level of the red blood cells which carry oxygen, and this usually causes an energy increase.  The nurse administering the transfusion will monitor you carefully for complications. HOME CARE INSTRUCTIONS  No special instructions are needed after a transfusion. You may find your energy is better. Speak with your caregiver about any limitations on activity for underlying diseases you may have. SEEK MEDICAL CARE IF:   Your condition is not improving after your transfusion.  You develop redness or irritation at the intravenous (IV) site. SEEK IMMEDIATE MEDICAL CARE IF:  Any of the following symptoms occur over the next 12 hours:  Shaking chills.  You have a temperature by mouth above 102 F (38.9 C), not controlled by medicine.  Chest, back, or muscle pain.  People around you feel you are not acting correctly or are confused.  Shortness of breath or difficulty breathing.  Dizziness and fainting.  You get a rash or develop hives.  You have a decrease in urine output.  Your urine turns a dark color or changes to pink, red, or brown. Any of the following symptoms occur over the next 10 days:  You have a temperature by mouth above 102 F (38.9 C), not controlled by medicine.  Shortness of breath.  Weakness after normal activity.  The white part of the eye turns yellow (jaundice).  You have a decrease in the amount of urine or are urinating less often.  Your urine turns a dark color or changes to pink, red, or brown. Document Released: 02/16/2000 Document Revised: 05/13/2011 Document Reviewed: 10/05/2007 [facility] Patient Information 2014  [place], [place].  _______________________________________________________________________

## 2019-02-05 ENCOUNTER — Other Ambulatory Visit (HOSPITAL_COMMUNITY)
Admission: RE | Admit: 2019-02-05 | Discharge: 2019-02-05 | Disposition: A | Discharge status: Home / Self Care | Payer: Medicare HMO | Source: Ambulatory Visit | Attending: *Deleted | Admitting: *Deleted

## 2019-02-05 DIAGNOSIS — Z01812 Encounter for preprocedural laboratory examination: Secondary | ICD-10-CM | POA: Insufficient documentation

## 2019-02-05 DIAGNOSIS — Z20828 Contact with and (suspected) exposure to other viral communicable diseases: Secondary | ICD-10-CM | POA: Diagnosis not present

## 2019-02-05 NOTE — H&P (Signed)
I was consulted to assess the patient's prolapse worsening since February. She feels vaginal bulging. She does not reduce it. No splinting maneuvers. Recently her constipation is quite significant.   She leaks with coughing sneezing bending lifting. She has urge incontinence which is most predominant. She does not wear a pad. No bedwetting   She voids every 2 or 3 hours and gets up once or twice a night   It takes time for her to void. She stops and starts. She does not strain. Overall flow good and does feel empty   On pelvic examination she had small grade 3 cystocele with moderate central defect. When she would bear down the vaginal cuff descended from either 9 cm to about 4 5 cm. With the vault suspended she had a diffuse grade 2 rectocele that was not that large and more in the mid vaginal wall. She had mild hypermobility the bladder neck and negative cough test with the cystocele reduced. Based upon her anatomy the mild elevated residual could be due to the presence of the cystocele with a mild hinge effect   Residual was 91 mL   The patient has mild mixed incontinence and mild frequency and nocturia. She does have some flow symptoms and prolapse symptoms. Picture was drawn if the patient ever had surgery she would likely best benefit from a transvaginal vault suspension a cystocele repair and graft. I will consent her for rectocele repair and make an intraoperative decision. The role of urodynamics discussed. Patient knew of and did not want a pessary. She is sexually active.   Today  Frequency stable. Incontinence stable.  She did not void prior to the study was catheterized for 200 mL. Maximum bladder capacity was 827 mL. Her bladder was hypo sensitive. Bladder was stable. She did have trouble voiding in the lab setting. She would generate argue believe very low bladder contractions and voided with an interrupted flow. The contractions were weak and not well sustained. She voided 552 mL  with maximum flow of 12 mL/second. Maximum voiding pressure of 8 cm water. Residual was 274 mL. EMG activity was increased during the voiding phase bladder neck descended 2-3 cm and moderate cystocele noted. She had some mild urethral discomfort grade 1/10. She had to take the vaginal packing out for her to void. Details of the urodynamics or sign dictated   A picture was drawn. I went over watchful waiting versus a pessary versus transvaginal vault suspension cystocele repair and graft and consent for rectocele repair that she probably does not need. I recommended no sling because she does not wear a pad and she has by history and urodynamics a lot of flow issues and a tendency towards elevated residuals. The best and only physiologic residual was 91 mL. Sequelae and severe knee discussed. If she did ever have stress incontinence treated a bulking agent is another good option but I did not offer that intraoperatively today       ALLERGIES: cefuroxime - Other Reaction, Nausea    MEDICATIONS: Levothyroxine Sodium     GU PSH: Complex cystometrogram, w/ void pressure and urethral pressure profile studies, any technique - 12/09/2018 Complex Uroflow - 12/09/2018 Emg surf Electrd - 12/09/2018 Inject For cystogram - 12/09/2018 Intrabd voidng Press - 12/09/2018     NON-GU PSH: Partial Hysterectomy - 1993 Remove Gallbladder - 2002     GU PMH: Cystocele, midline - 11/24/2018 Mixed incontinence - 11/24/2018    NON-GU PMH: Anxiety Depression GERD    FAMILY  HISTORY: No Family History    SOCIAL HISTORY: Marital Status: Divorced Current Smoking Status: Patient does not smoke anymore.   Tobacco Use Assessment Completed: Used Tobacco in last 30 days? Does not use smokeless tobacco. Drinks 1 drink per day. Types of alcohol consumed: Wine.     REVIEW OF SYSTEMS:    GU Review Female:   Patient denies frequent urination, hard to postpone urination, burning /pain with urination, get up at night to  urinate, leakage of urine, stream starts and stops, trouble starting your stream, have to strain to urinate, and being pregnant.  Gastrointestinal (Upper):   Patient denies nausea, vomiting, and indigestion/ heartburn.  Gastrointestinal (Lower):   Patient denies diarrhea and constipation.  Constitutional:   Patient denies fever, night sweats, weight loss, and fatigue.  Skin:   Patient denies skin rash/ lesion and itching.  Eyes:   Patient denies blurred vision and double vision.  Ears/ Nose/ Throat:   Patient denies sore throat and sinus problems.  Hematologic/Lymphatic:   Patient denies easy bruising and swollen glands.  Cardiovascular:   Patient denies leg swelling and chest pains.  Respiratory:   Patient denies cough and shortness of breath.  Endocrine:   Patient denies excessive thirst.  Musculoskeletal:   Patient denies back pain and joint pain.  Neurological:   Patient denies headaches and dizziness.  Psychologic:   Patient denies depression and anxiety.   VITAL SIGNS: None   PAST DATA REVIEWED:  Source Of History:  Patient   PROCEDURES:          Urinalysis w/Scope - 81001 Dipstick Dipstick Cont'd Micro  Color: Yellow Bilirubin: Neg WBC/hpf: 6 - 10/hpf  Appearance: Clear Ketones: Neg RBC/hpf: 0 - 2/hpf  Specific Gravity: 1.020 Blood: Neg Bacteria: NS (Not Seen)  pH: 6.0 Protein: Neg Cystals: NS (Not Seen)  Glucose: Neg Urobilinogen: 0.2 Casts: NS (Not Seen)    Nitrites: Neg Trichomonas: Not Present    Leukocyte Esterase: 2+ Mucous: Present      Epithelial Cells: 0 - 5/hpf      Yeast: NS (Not Seen)      Sperm: Not Present    Notes:      ASSESSMENT:      ICD-10 Details  1 GU:   Cystocele, midline - N81.11      PLAN:           Document Letter(s):  Created for Patient: Clinical Summary         Next Appointment:      Next Appointment: 02/09/2019 07:30 AM    Appointment Type: Surgery     Location: [facility] [phone]    Provider: Bjorn Loser, M.D.    Reason for Visit: OBS WL REPAIR CYSTOCELE Natasha Wilson    After a thorough review of the management options for the patient's condition the patient  elected to proceed with surgical therapy as noted above. We have discussed the potential benefits and risks of the procedure, side effects of the proposed treatment, the likelihood of the patient achieving the goals of the procedure, and any potential problems that might occur during the procedure or recuperation. Informed consent has been obtained.

## 2019-02-06 LAB — NOVEL CORONAVIRUS, NAA (HOSP ORDER, SEND-OUT TO REF LAB; TAT 18-24 HRS): SARS-CoV-2, NAA: NOT DETECTED

## 2019-02-08 ENCOUNTER — Other Ambulatory Visit: Payer: Self-pay

## 2019-02-08 ENCOUNTER — Encounter (HOSPITAL_COMMUNITY)
Admission: RE | Admit: 2019-02-08 | Discharge: 2019-02-08 | Disposition: A | Discharge status: Home / Self Care | Payer: Medicare HMO | Source: Ambulatory Visit | Attending: Urology | Admitting: Urology

## 2019-02-08 ENCOUNTER — Encounter (HOSPITAL_COMMUNITY): Payer: Self-pay

## 2019-02-08 DIAGNOSIS — Z01812 Encounter for preprocedural laboratory examination: Secondary | ICD-10-CM | POA: Insufficient documentation

## 2019-02-08 DIAGNOSIS — N814 Uterovaginal prolapse, unspecified: Secondary | ICD-10-CM | POA: Diagnosis not present

## 2019-02-08 HISTORY — DX: Gastro-esophageal reflux disease without esophagitis: K21.9

## 2019-02-08 LAB — BASIC METABOLIC PANEL
Anion gap: 10 (ref 5–15)
BUN: 10 mg/dL (ref 8–23)
CO2: 26 mmol/L (ref 22–32)
Calcium: 9.5 mg/dL (ref 8.9–10.3)
Chloride: 104 mmol/L (ref 98–111)
Creatinine, Ser: 0.65 mg/dL (ref 0.44–1.00)
GFR calc Af Amer: >60 mL/min (ref >60)
GFR calc non Af Amer: >60 mL/min (ref >60)
Glucose, Bld: 99 mg/dL (ref 70–99)
Potassium: 3.8 mmol/L (ref 3.5–5.1)
Sodium: 140 mmol/L (ref 135–145)

## 2019-02-08 LAB — CBC
HCT: 42.1 % (ref 36.0–46.0)
Hemoglobin: 12.9 g/dL (ref 12.0–15.0)
MCH: 26.1 pg (ref 26.0–34.0)
MCHC: 30.6 g/dL (ref 30.0–36.0)
MCV: 85.1 fL (ref 80.0–100.0)
Platelets: 250 10*3/uL (ref 150–400)
RBC: 4.95 MIL/uL (ref 3.87–5.11)
RDW: 13.4 % (ref 11.5–15.5)
WBC: 8.8 10*3/uL (ref 4.0–10.5)
nRBC: 0 % (ref 0.0–0.2)

## 2019-02-08 LAB — ABO/RH: ABO/RH(D): A POS

## 2019-02-08 LAB — PROTIME-INR
INR: 1.1 (ref 0.8–1.2)
Prothrombin Time: 13.9 seconds (ref 11.4–15.2)

## 2019-02-08 MED ORDER — GENTAMICIN SULFATE 40 MG/ML IJ SOLN
5.0000 mg/kg | INTRAVENOUS | Status: AC
  Administered 2019-02-09: 370 mg via INTRAVENOUS
  Filled 2019-02-08: qty 9.25

## 2019-02-08 NOTE — Progress Notes (Signed)
PCP -  Emeterio Reeve, MD Cardiologist - n/a  Chest x-ray - n/a EKG - n/a Stress Test - n/a ECHO - n/a Cardiac Cath - n/a  Sleep Study - n/a CPAP -   Fasting Blood Sugar - n/a Checks Blood Sugar _____ times a day  Blood Thinner Instructions:NONE Aspirin Instructions: Last Dose:  Anesthesia review: No anesthesia review needed.  Patient denies shortness of breath, fever, cough and chest pain at PAT appointment   Patient verbalized understanding of instructions that were given to them at the PAT appointment. Patient was also instructed that they will need to review over the PAT instructions again at home before surgery.

## 2019-02-09 ENCOUNTER — Ambulatory Visit (HOSPITAL_COMMUNITY): Payer: Medicare HMO | Admitting: Physician Assistant

## 2019-02-09 ENCOUNTER — Encounter (HOSPITAL_COMMUNITY): Payer: Self-pay | Admitting: *Deleted

## 2019-02-09 ENCOUNTER — Encounter (HOSPITAL_COMMUNITY)
Admission: RE | Disposition: A | Discharge status: Home / Self Care | Payer: Self-pay | Source: Home / Self Care | Attending: Urology

## 2019-02-09 ENCOUNTER — Observation Stay (HOSPITAL_COMMUNITY)
Admission: RE | Admit: 2019-02-09 | Discharge: 2019-02-10 | Disposition: A | Discharge status: Home / Self Care | Payer: Medicare HMO | Attending: Urology | Admitting: Urology

## 2019-02-09 ENCOUNTER — Ambulatory Visit (HOSPITAL_COMMUNITY): Payer: Medicare HMO | Admitting: Registered Nurse

## 2019-02-09 DIAGNOSIS — E039 Hypothyroidism, unspecified: Secondary | ICD-10-CM | POA: Insufficient documentation

## 2019-02-09 DIAGNOSIS — K219 Gastro-esophageal reflux disease without esophagitis: Secondary | ICD-10-CM | POA: Insufficient documentation

## 2019-02-09 DIAGNOSIS — N816 Rectocele: Secondary | ICD-10-CM | POA: Diagnosis not present

## 2019-02-09 DIAGNOSIS — Z881 Allergy status to other antibiotic agents status: Secondary | ICD-10-CM | POA: Insufficient documentation

## 2019-02-09 DIAGNOSIS — N814 Uterovaginal prolapse, unspecified: Secondary | ICD-10-CM | POA: Diagnosis present

## 2019-02-09 DIAGNOSIS — Z79890 Hormone replacement therapy (postmenopausal): Secondary | ICD-10-CM | POA: Diagnosis not present

## 2019-02-09 DIAGNOSIS — N8111 Cystocele, midline: Principal | ICD-10-CM | POA: Insufficient documentation

## 2019-02-09 DIAGNOSIS — Z7982 Long term (current) use of aspirin: Secondary | ICD-10-CM | POA: Insufficient documentation

## 2019-02-09 DIAGNOSIS — N8110 Cystocele, unspecified: Secondary | ICD-10-CM | POA: Diagnosis not present

## 2019-02-09 DIAGNOSIS — Z87891 Personal history of nicotine dependence: Secondary | ICD-10-CM | POA: Insufficient documentation

## 2019-02-09 HISTORY — PX: CYSTOSCOPY: SHX5120

## 2019-02-09 HISTORY — PX: ANTERIOR AND POSTERIOR REPAIR: SHX5121

## 2019-02-09 LAB — TYPE AND SCREEN
ABO/RH(D): A POS
Antibody Screen: NEGATIVE

## 2019-02-09 LAB — HEMOGLOBIN AND HEMATOCRIT, BLOOD
HCT: 38.5 % (ref 36.0–46.0)
Hemoglobin: 11.7 g/dL — ABNORMAL LOW (ref 12.0–15.0)

## 2019-02-09 SURGERY — ANTERIOR (CYSTOCELE) AND POSTERIOR REPAIR (RECTOCELE)
Anesthesia: General

## 2019-02-09 MED ORDER — EPHEDRINE 5 MG/ML INJ
INTRAVENOUS | Status: AC
  Filled 2019-02-09: qty 10

## 2019-02-09 MED ORDER — ROCURONIUM BROMIDE 10 MG/ML (PF) SYRINGE
PREFILLED_SYRINGE | INTRAVENOUS | Status: DC | PRN
  Administered 2019-02-09: 80 mg via INTRAVENOUS

## 2019-02-09 MED ORDER — PROPOFOL 10 MG/ML IV BOLUS
INTRAVENOUS | Status: DC | PRN
  Administered 2019-02-09: 120 mg via INTRAVENOUS

## 2019-02-09 MED ORDER — DIPHENHYDRAMINE HCL 12.5 MG/5ML PO ELIX: 12.5000 mg | ORAL_SOLUTION | Freq: Four times a day (QID) | ORAL | Status: DC | PRN

## 2019-02-09 MED ORDER — SODIUM CHLORIDE 0.9 % IV SOLN
INTRAVENOUS | Status: AC
  Filled 2019-02-09: qty 500000

## 2019-02-09 MED ORDER — CHLORHEXIDINE GLUCONATE CLOTH 2 % EX PADS
6.0000 | MEDICATED_PAD | Freq: Every day | CUTANEOUS | Status: DC
  Administered 2019-02-09: 6 via TOPICAL

## 2019-02-09 MED ORDER — PHENAZOPYRIDINE HCL 200 MG PO TABS
200.0000 mg | ORAL_TABLET | Freq: Once | ORAL | Status: AC
  Administered 2019-02-09: 200 mg via ORAL
  Filled 2019-02-09: qty 1

## 2019-02-09 MED ORDER — HYDROMORPHONE HCL 1 MG/ML IJ SOLN
0.2500 mg | INTRAMUSCULAR | Status: DC | PRN
  Administered 2019-02-09 (×2): 0.5 mg via INTRAVENOUS

## 2019-02-09 MED ORDER — SUGAMMADEX SODIUM 200 MG/2ML IV SOLN
INTRAVENOUS | Status: DC | PRN
  Administered 2019-02-09: 200 mg via INTRAVENOUS

## 2019-02-09 MED ORDER — FENTANYL CITRATE (PF) 250 MCG/5ML IJ SOLN
INTRAMUSCULAR | Status: AC
  Filled 2019-02-09: qty 5

## 2019-02-09 MED ORDER — ONDANSETRON HCL 4 MG/2ML IJ SOLN: 4.0000 mg | INTRAMUSCULAR | Status: DC | PRN

## 2019-02-09 MED ORDER — HYDROCODONE-ACETAMINOPHEN 5-325 MG PO TABS
1.0000 | ORAL_TABLET | ORAL | Status: DC | PRN
  Administered 2019-02-09 (×2): 1 via ORAL
  Administered 2019-02-10: 2 via ORAL
  Filled 2019-02-09: qty 1
  Filled 2019-02-09: qty 2
  Filled 2019-02-09: qty 1

## 2019-02-09 MED ORDER — LIDOCAINE-EPINEPHRINE (PF) 1 %-1:200000 IJ SOLN
INTRAMUSCULAR | Status: DC | PRN
  Administered 2019-02-09: 40 mL

## 2019-02-09 MED ORDER — CLINDAMYCIN PHOSPHATE 600 MG/50ML IV SOLN
600.0000 mg | INTRAVENOUS | Status: AC
  Administered 2019-02-09: 600 mg via INTRAVENOUS
  Filled 2019-02-09: qty 50

## 2019-02-09 MED ORDER — MIDAZOLAM HCL 5 MG/5ML IJ SOLN
INTRAMUSCULAR | Status: DC | PRN
  Administered 2019-02-09: 2 mg via INTRAVENOUS

## 2019-02-09 MED ORDER — CLINDAMYCIN PHOSPHATE 2 % VA CREA
TOPICAL_CREAM | VAGINAL | Status: AC
  Filled 2019-02-09: qty 40

## 2019-02-09 MED ORDER — LACTATED RINGERS IV SOLN
INTRAVENOUS | Status: DC
  Administered 2019-02-09 (×2): via INTRAVENOUS

## 2019-02-09 MED ORDER — STERILE WATER FOR IRRIGATION IR SOLN
Status: DC | PRN
  Administered 2019-02-09: 3000 mL

## 2019-02-09 MED ORDER — FENTANYL CITRATE (PF) 250 MCG/5ML IJ SOLN
INTRAMUSCULAR | Status: DC | PRN
  Administered 2019-02-09: 100 ug via INTRAVENOUS
  Administered 2019-02-09: 50 ug via INTRAVENOUS

## 2019-02-09 MED ORDER — LIDOCAINE 2% (20 MG/ML) 5 ML SYRINGE
INTRAMUSCULAR | Status: DC | PRN
  Administered 2019-02-09: 80 mg via INTRAVENOUS

## 2019-02-09 MED ORDER — OXYCODONE HCL 5 MG/5ML PO SOLN: 5.0000 mg | Freq: Once | ORAL | Status: DC | PRN

## 2019-02-09 MED ORDER — DOCUSATE SODIUM 100 MG PO CAPS
100.0000 mg | ORAL_CAPSULE | Freq: Two times a day (BID) | ORAL | Status: DC
  Administered 2019-02-09 – 2019-02-10 (×2): 100 mg via ORAL
  Filled 2019-02-09 (×2): qty 1

## 2019-02-09 MED ORDER — ONDANSETRON HCL 4 MG/2ML IJ SOLN
INTRAMUSCULAR | Status: AC
  Filled 2019-02-09: qty 2

## 2019-02-09 MED ORDER — ROCURONIUM BROMIDE 10 MG/ML (PF) SYRINGE
PREFILLED_SYRINGE | INTRAVENOUS | Status: AC
  Filled 2019-02-09: qty 10

## 2019-02-09 MED ORDER — CLINDAMYCIN PHOSPHATE 2 % VA CREA
TOPICAL_CREAM | VAGINAL | Status: DC | PRN
  Administered 2019-02-09: 1 via VAGINAL

## 2019-02-09 MED ORDER — ONDANSETRON HCL 4 MG/2ML IJ SOLN
INTRAMUSCULAR | Status: DC | PRN
  Administered 2019-02-09: 4 mg via INTRAVENOUS

## 2019-02-09 MED ORDER — HYDROMORPHONE HCL 1 MG/ML IJ SOLN
INTRAMUSCULAR | Status: AC
  Filled 2019-02-09: qty 1

## 2019-02-09 MED ORDER — SODIUM CHLORIDE 0.9 % IV SOLN
INTRAVENOUS | Status: DC | PRN
  Administered 2019-02-09: 500 mL

## 2019-02-09 MED ORDER — DEXTROSE-NACL 5-0.45 % IV SOLN
INTRAVENOUS | Status: DC
  Administered 2019-02-09 – 2019-02-10 (×2): via INTRAVENOUS

## 2019-02-09 MED ORDER — DIPHENHYDRAMINE HCL 50 MG/ML IJ SOLN: 12.5000 mg | Freq: Four times a day (QID) | INTRAMUSCULAR | Status: DC | PRN

## 2019-02-09 MED ORDER — LEVOTHYROXINE SODIUM 75 MCG PO TABS
75.0000 ug | ORAL_TABLET | Freq: Every day | ORAL | Status: DC
  Administered 2019-02-10: 75 ug via ORAL
  Filled 2019-02-09: qty 1

## 2019-02-09 MED ORDER — HYDROCODONE-ACETAMINOPHEN 5-325 MG PO TABS: 1.0000 | ORAL_TABLET | Freq: Four times a day (QID) | ORAL | 0 refills | Status: DC | PRN

## 2019-02-09 MED ORDER — MORPHINE SULFATE (PF) 4 MG/ML IV SOLN: 2.0000 mg | INTRAVENOUS | Status: DC | PRN

## 2019-02-09 MED ORDER — MEPERIDINE HCL 50 MG/ML IJ SOLN: 6.2500 mg | INTRAMUSCULAR | Status: DC | PRN

## 2019-02-09 MED ORDER — PROPOFOL 10 MG/ML IV BOLUS
INTRAVENOUS | Status: AC
  Filled 2019-02-09: qty 20

## 2019-02-09 MED ORDER — LIDOCAINE 2% (20 MG/ML) 5 ML SYRINGE
INTRAMUSCULAR | Status: AC
  Filled 2019-02-09: qty 5

## 2019-02-09 MED ORDER — ACETAMINOPHEN 325 MG PO TABS
650.0000 mg | ORAL_TABLET | ORAL | Status: DC | PRN
  Administered 2019-02-09: 650 mg via ORAL
  Filled 2019-02-09: qty 2

## 2019-02-09 MED ORDER — OXYBUTYNIN CHLORIDE 5 MG PO TABS
5.0000 mg | ORAL_TABLET | Freq: Three times a day (TID) | ORAL | Status: DC | PRN
  Administered 2019-02-09: 5 mg via ORAL
  Filled 2019-02-09: qty 1

## 2019-02-09 MED ORDER — DEXAMETHASONE SODIUM PHOSPHATE 10 MG/ML IJ SOLN
INTRAMUSCULAR | Status: AC
  Filled 2019-02-09: qty 1

## 2019-02-09 MED ORDER — MIDAZOLAM HCL 2 MG/2ML IJ SOLN
INTRAMUSCULAR | Status: AC
  Filled 2019-02-09: qty 2

## 2019-02-09 MED ORDER — LIDOCAINE-EPINEPHRINE (PF) 1 %-1:200000 IJ SOLN
INTRAMUSCULAR | Status: AC
  Filled 2019-02-09: qty 30

## 2019-02-09 MED ORDER — OXYCODONE HCL 5 MG PO TABS: 5.0000 mg | ORAL_TABLET | Freq: Once | ORAL | Status: DC | PRN

## 2019-02-09 MED ORDER — DEXAMETHASONE SODIUM PHOSPHATE 10 MG/ML IJ SOLN
INTRAMUSCULAR | Status: DC | PRN
  Administered 2019-02-09: 10 mg via INTRAVENOUS

## 2019-02-09 MED ORDER — PROMETHAZINE HCL 25 MG/ML IJ SOLN: 6.2500 mg | INTRAMUSCULAR | Status: DC | PRN

## 2019-02-09 MED ORDER — PANTOPRAZOLE SODIUM 20 MG PO TBEC
20.0000 mg | DELAYED_RELEASE_TABLET | Freq: Every day | ORAL | Status: DC
  Administered 2019-02-09 – 2019-02-10 (×2): 20 mg via ORAL
  Filled 2019-02-09 (×2): qty 1

## 2019-02-09 MED ORDER — EPHEDRINE SULFATE-NACL 50-0.9 MG/10ML-% IV SOSY
PREFILLED_SYRINGE | INTRAVENOUS | Status: DC | PRN
  Administered 2019-02-09: 10 mg via INTRAVENOUS
  Administered 2019-02-09: 5 mg via INTRAVENOUS
  Administered 2019-02-09: 10 mg via INTRAVENOUS

## 2019-02-09 SURGICAL SUPPLY — 63 items
BAG DECANTER FOR FLEXI CONT (MISCELLANEOUS) ×3 IMPLANT
BAG URINE DRAIN 2000ML AR STRL (UROLOGICAL SUPPLIES) ×3 IMPLANT
BAG URO CATCHER STRL LF (MISCELLANEOUS) ×3 IMPLANT
BLADE SURG 15 STRL LF DISP TIS (BLADE) ×1 IMPLANT
BLADE SURG 15 STRL SS (BLADE) ×2
BRIEF STRETCH FOR OB PAD LRG (UNDERPADS AND DIAPERS) ×3 IMPLANT
CATH FOLEY 2W COUNCIL 5CC 18FR (CATHETERS) IMPLANT
CATH FOLEY 2WAY SLVR  5CC 14FR (CATHETERS) ×2
CATH FOLEY 2WAY SLVR 5CC 14FR (CATHETERS) ×1 IMPLANT
CLOTH BEACON ORANGE TIMEOUT ST (SAFETY) IMPLANT
COVER MAYO STAND STRL (DRAPES) ×3 IMPLANT
COVER SURGICAL LIGHT HANDLE (MISCELLANEOUS) ×3 IMPLANT
COVER WAND RF STERILE (DRAPES) IMPLANT
DECANTER SPIKE VIAL GLASS SM (MISCELLANEOUS) ×3 IMPLANT
DEVICE CAPIO SLIM SINGLE (INSTRUMENTS) IMPLANT
DRAIN PENROSE 18X1/4 LTX STRL (WOUND CARE) ×3 IMPLANT
DRAPE UNDERBUTTOCKS STRL (DISPOSABLE) ×3 IMPLANT
ELECT REM PT RETURN 15FT ADLT (MISCELLANEOUS) ×3 IMPLANT
GAUZE 4X4 16PLY RFD (DISPOSABLE) ×12 IMPLANT
GAUZE PACKING 1 X5 YD ST (GAUZE/BANDAGES/DRESSINGS) IMPLANT
GAUZE PACKING 2X5 YD STRL (GAUZE/BANDAGES/DRESSINGS) ×3 IMPLANT
GLOVE BIO SURGEON STRL SZ 6.5 (GLOVE) ×2 IMPLANT
GLOVE BIO SURGEON STRL SZ8 (GLOVE) ×3 IMPLANT
GLOVE BIO SURGEONS STRL SZ 6.5 (GLOVE) ×1
GLOVE BIOGEL M STRL SZ7.5 (GLOVE) ×3 IMPLANT
GLOVE BIOGEL PI IND STRL 8.5 (GLOVE) ×1 IMPLANT
GLOVE BIOGEL PI INDICATOR 8.5 (GLOVE) ×2
GLOVE ECLIPSE 8.5 STRL (GLOVE) ×6 IMPLANT
GOWN STRL REUS W/TWL XL LVL3 (GOWN DISPOSABLE) ×6 IMPLANT
HOLDER FOLEY CATH W/STRAP (MISCELLANEOUS) ×3 IMPLANT
IV NS 1000ML (IV SOLUTION)
IV NS 1000ML BAXH (IV SOLUTION) IMPLANT
KIT BASIN OR (CUSTOM PROCEDURE TRAY) ×3 IMPLANT
KIT TURNOVER KIT A (KITS) ×3 IMPLANT
NEEDLE HYPO 22GX1.5 SAFETY (NEEDLE) ×3 IMPLANT
NEEDLE MAYO 6 CRC TAPER PT (NEEDLE) ×3 IMPLANT
NS IRRIG 1000ML POUR BTL (IV SOLUTION) IMPLANT
PACK CYSTO (CUSTOM PROCEDURE TRAY) ×3 IMPLANT
PAD OB MATERNITY 4.3X12.25 (PERSONAL CARE ITEMS) ×3 IMPLANT
PLUG CATH AND CAP STER (CATHETERS) ×3 IMPLANT
PROTECTOR NERVE ULNAR (MISCELLANEOUS) ×3 IMPLANT
RETRACTOR STAY HOOK 5MM (MISCELLANEOUS) ×3 IMPLANT
SHEET LAVH (DRAPES) ×3 IMPLANT
SUT CAPIO ETHIBPND (SUTURE) IMPLANT
SUT VIC AB 0 CT1 27 (SUTURE) ×2
SUT VIC AB 0 CT1 27XBRD ANTBC (SUTURE) ×1 IMPLANT
SUT VIC AB 2-0 CT1 27 (SUTURE) ×8
SUT VIC AB 2-0 CT1 27XBRD (SUTURE) ×4 IMPLANT
SUT VIC AB 2-0 SH 27 (SUTURE) ×6
SUT VIC AB 2-0 SH 27X BRD (SUTURE) ×3 IMPLANT
SUT VIC AB 3-0 SH 27 (SUTURE) ×4
SUT VIC AB 3-0 SH 27XBRD (SUTURE) ×2 IMPLANT
SUT VICRYL 0 UR6 27IN ABS (SUTURE) ×6 IMPLANT
SYR 10ML LL (SYRINGE) ×3 IMPLANT
TOWEL OR 17X26 10 PK STRL BLUE (TOWEL DISPOSABLE) ×3 IMPLANT
TOWEL OR NON WOVEN STRL DISP B (DISPOSABLE) ×3 IMPLANT
TUBING CONNECTING 10 (TUBING) ×2 IMPLANT
TUBING CONNECTING 10' (TUBING) ×1
TUBING UROLOGY SET (TUBING) IMPLANT
UNDERPAD 30X36 HEAVY ABSORB (UNDERPADS AND DIAPERS) ×3 IMPLANT
WATER STERILE IRR 1000ML POUR (IV SOLUTION) IMPLANT
WATER STERILE IRR 500ML POUR (IV SOLUTION) ×3 IMPLANT
YANKAUER SUCT BULB TIP 10FT TU (MISCELLANEOUS) ×3 IMPLANT

## 2019-02-09 NOTE — Anesthesia Procedure Notes (Signed)
Procedure Name: Intubation Date/Time: 02/09/2019 7:39 AM Performed by: Talbot Grumbling, CRNA Pre-anesthesia Checklist: Patient identified, Emergency Drugs available, Suction available and Patient being monitored Patient Re-evaluated:Patient Re-evaluated prior to induction Oxygen Delivery Method: Circle system utilized Preoxygenation: Pre-oxygenation with 100% oxygen Induction Type: IV induction Ventilation: Mask ventilation without difficulty Laryngoscope Size: Mac and 3 Grade View: Grade I Tube type: Oral Tube size: 7.0 mm Number of attempts: 1 Airway Equipment and Method: Stylet Placement Confirmation: ETT inserted through vocal cords under direct vision,  positive ETCO2 and breath sounds checked- equal and bilateral Secured at: 21 cm Tube secured with: Tape Dental Injury: Teeth and Oropharynx as per pre-operative assessment

## 2019-02-09 NOTE — Anesthesia Postprocedure Evaluation (Signed)
Anesthesia Post Note  Patient: Natasha Wilson  Procedure(s) Performed: ANTERIOR (CYSTOCELE) AND POSTERIOR REPAIR (RECTOCELE) (N/A ) CYSTOSCOPY (N/A )     Patient location during evaluation: PACU Anesthesia Type: General Level of consciousness: awake and alert Pain management: pain level controlled Vital Signs Assessment: post-procedure vital signs reviewed and stable Respiratory status: spontaneous breathing, nonlabored ventilation and respiratory function stable Cardiovascular status: blood pressure returned to baseline and stable Postop Assessment: no apparent nausea or vomiting Anesthetic complications: no    Last Vitals:  Vitals:   02/09/19 1130 02/09/19 1145  BP: 125/67 119/69  Pulse: 80 73  Resp: 17 17  Temp: 36.6 C   SpO2: 95% 94%    Last Pain:  Vitals:   02/09/19 1130  TempSrc:   PainSc: [place]

## 2019-02-09 NOTE — Anesthesia Preprocedure Evaluation (Addendum)
Anesthesia Evaluation  Patient identified by MRN, date of birth, ID band Patient awake    Reviewed: Allergy & Precautions, NPO status , Patient's Chart, lab work & pertinent test results  Airway Mallampati: II  TM Distance: >3 FB Neck ROM: Full    Dental  (+) Poor Dentition   Pulmonary neg pulmonary ROS, former smoker,    Pulmonary exam normal breath sounds clear to auscultation       Cardiovascular negative cardio ROS Normal cardiovascular exam Rhythm:Regular Rate:Normal     Neuro/Psych Anxiety negative neurological ROS  negative psych ROS   GI/Hepatic Neg liver ROS, GERD  ,  Endo/Other  Hypothyroidism   Renal/GU negative Renal ROS  negative genitourinary   Musculoskeletal negative musculoskeletal ROS (+)   Abdominal   Peds negative pediatric ROS (+)  Hematology negative hematology ROS (+)   Anesthesia Other Findings   Reproductive/Obstetrics negative OB ROS                            Anesthesia Physical Anesthesia Plan  ASA: II  Anesthesia Plan: General   Post-op Pain Management:    Induction: Intravenous  PONV Risk Score and Plan: 3 and Ondansetron, Dexamethasone, Midazolam and Treatment may vary due to age or medical condition  Airway Management Planned: Oral ETT  Additional Equipment:   Intra-op Plan:   Post-operative Plan: Extubation in OR  Informed Consent: I have reviewed the patients History and Physical, chart, labs and discussed the procedure including the risks, benefits and alternatives for the proposed anesthesia with the patient or authorized representative who has indicated his/her understanding and acceptance.     Dental advisory given  Plan Discussed with: CRNA  Anesthesia Plan Comments:         Anesthesia Quick Evaluation

## 2019-02-09 NOTE — Op Note (Signed)
Preoperative diagnosis: Cystocele and rectocele and mild vault prolapse Postoperative diagnosis: Cystocele and rectocele and mild vault prolapse Surgery: Cystocele repair and rectocele repair and cystoscopy Surgeon: Dr. Nicki Reaper Lorma Heater Assistant: Estill Bamberg dancy  The patient has the above diagnosis and consented to the above procedure.  Extra care was taken with leg positioning to minimize the risk of compartment syndrome and neuropathy and deep vein thrombosis.  Preoperative antibiotics were given.  Under anesthesia the patient had good support of her vaginal cuff.  She had a moderate grade 2 cystocele.  She had a milder rectocele that obviously needed repair when I double checked throughout the case.  She had laxity of the pelvic sidewalls.  She did have shortening of the vaginal vault at rest.  She had a very narrow pubic arch making the surgery quite challenging.  I actually had to shorten my retractor to allow me to deliver her apex since it was so well supported.  3-0 Vicryl suture was placed at the apex  I utilized my Allis clamps and did my T-shaped incision.  I sharply dissected the overlying vaginal wall mucosa from the underlying pubocervical fascia.  Again she was petite and there was not a lot of dissection but it took a long time due to her limited access.  I dissected to the white line bilaterally.  I was very pleased with the mobilization of the bladder at the apex.  The patient had a 2 layer anterior repair with running 2-0 Vicryl.  I was very pleased with the surgery  I cystoscoped the patient.  Excellent refflux bilaterally.  Good cystocele repair.  No bladder injury.  I trimmed an appropriate amount of anterior vaginal wall and closed it with the 2-0 Vicryl on a CT1 needle.  Before and after the closure I checked the apex and she did not need a vault suspension.  She had only mild descensus.  She did need a rectocele repair  I had instilled 20 cc of a lidocaine epinephrine mixture  anteriorly and I did the same posteriorly.  I placed an Allis clamp on the hymenal ring bilaterally and removed a small triangular perineal skin.  I did my usual Allis technique with midline incision.  I sharply dissected the underlying rectovaginal fascia from the overlying vaginal epithelium.  I mobilized very well at the apex.  I mobilized very well laterally.  She had diffuse thinning and she underwent a posterior repair with running 2-0 Vicryl on SH needle.  I double checked with a digital rectal examination and the repair was excellent with no injury.  I trimmed only a couple of millimeters of posterior vaginal wall epithelium and closed with running 2-0 Vicryl and CT1 needle  I did 1 perineal body gentle closure with an interrupted 0 Vicryl.  She had some deficiency at the being the case.  I exteriorized my 2-0 Vicryl suture and used subcuticular to close the perineum  Less than 1 vaginal pack with clindamycin cream was applied.  Leg position was good.  Blood loss was less than 100 mL.  She had very good repair anteriorly and posteriorly.  Again she has some vaginal shortening at baseline.  Is very pleased with the surgery.  Hopefully it reaches her goal

## 2019-02-09 NOTE — Progress Notes (Signed)
Urology Progress Note   Day of Surgery from anterior and posterior prolapse repair 02/09/19.   Subjective: Transferred to floor.  Vaginal packing and Foley in place.  Overall, she is feeling well.   Objective: Vital signs in last 24 hours: Temp:  [97.7 F (36.5 C)-98 F (36.7 C)] 97.7 F (36.5 C) (12/08 1457) Pulse Rate:  [66-85] 74 (12/08 1457) Resp:  [11-23] 14 (12/08 1457) BP: (101-154)/(55-91) 104/55 (12/08 1457) SpO2:  [94 %-100 %] 97 % (12/08 1457) Weight:  [73.2 kg-76 kg] 76 kg (12/08 1457)  Intake/Output from previous day: No intake/output data recorded. Intake/Output this shift: Total I/O In: 2162 [I.V.:2112; IV Piggyback:50] Out: 825 [Urine:800; Blood:25]  Physical Exam:  General: Alert and oriented CV: Regular rate Lungs: No increased work of breathing Abdomen: Soft, appropriately tender. Incisions c/d/i. JP SS GU: Foley in place draining clear apricot urine  Ext: NT, No erythema  Lab Results: Recent Labs    02/08/19 0945  HGB 12.9  HCT 42.1   Recent Labs    02/08/19 0945  NA 140  K 3.8  CL 104  CO2 26  GLUCOSE 99  BUN 10  CREATININE 0.65  CALCIUM 9.5    Studies/Results: No results found.  Assessment/Plan:  68 y.o. female s/p anterior and posterior prolapse repair 02/09/2019.  Overall doing well post-op.   - Multimodal pain regimen - CLD, ADAT - mIVF tonight - Continue vaginal packing - Continue Foley - AM labs - OOB/Ambulate this evening  Dispo: Likely tomorrow AM after VP and Foley removed.    LOS: 0 days

## 2019-02-09 NOTE — Interval H&P Note (Signed)
History and Physical Interval Note:  02/09/2019 7:06 AM  Natasha Wilson  has presented today for surgery, with the diagnosis of CYSTOCELE RECTOCELE VAULT PROLAPSE.  The various methods of treatment have been discussed with the patient and family. After consideration of risks, benefits and other options for treatment, the patient has consented to  Procedure(s): ANTERIOR (CYSTOCELE) AND POSTERIOR REPAIR (RECTOCELE) (N/A) REPAIR VAGINAL VAULT SUSPENSION  AND  GRAFT (N/A) CYSTOSCOPY (N/A) as a surgical intervention.  The patient's history has been reviewed, patient examined, no change in status, stable for surgery.  I have reviewed the patient's chart and labs.  Questions were answered to the patient's satisfaction.     [name]

## 2019-02-09 NOTE — Transfer of Care (Signed)
Immediate Anesthesia Transfer of Care Note  Patient: Natasha Wilson  Procedure(s) Performed: ANTERIOR (CYSTOCELE) AND POSTERIOR REPAIR (RECTOCELE) (N/A ) CYSTOSCOPY (N/A )  Patient Location: PACU  Anesthesia Type:General  Level of Consciousness: sedated  Airway & Oxygen Therapy: Patient Spontanous Breathing and Patient connected to face mask oxygen  Post-op Assessment: Report given to RN and Post -op Vital signs reviewed and stable  Post vital signs: Reviewed and stable  Last Vitals:  Vitals Value Taken Time  BP    Temp    Pulse 85 02/09/19 1059  Resp 23 02/09/19 1059  SpO2 99 % 02/09/19 1059  Vitals shown include unvalidated device data.  Last Pain:  Vitals:   02/09/19 0558  TempSrc: Oral      Patients Stated Pain Goal: 3 ([id number])  Complications: No apparent anesthesia complications

## 2019-02-10 ENCOUNTER — Encounter (HOSPITAL_COMMUNITY): Payer: Self-pay | Admitting: Urology

## 2019-02-10 DIAGNOSIS — Z881 Allergy status to other antibiotic agents status: Secondary | ICD-10-CM | POA: Diagnosis not present

## 2019-02-10 DIAGNOSIS — N8111 Cystocele, midline: Secondary | ICD-10-CM | POA: Diagnosis not present

## 2019-02-10 DIAGNOSIS — R3914 Feeling of incomplete bladder emptying: Secondary | ICD-10-CM | POA: Diagnosis not present

## 2019-02-10 DIAGNOSIS — Z7982 Long term (current) use of aspirin: Secondary | ICD-10-CM | POA: Diagnosis not present

## 2019-02-10 DIAGNOSIS — Z79890 Hormone replacement therapy (postmenopausal): Secondary | ICD-10-CM | POA: Diagnosis not present

## 2019-02-10 DIAGNOSIS — Z87891 Personal history of nicotine dependence: Secondary | ICD-10-CM | POA: Diagnosis not present

## 2019-02-10 DIAGNOSIS — E039 Hypothyroidism, unspecified: Secondary | ICD-10-CM | POA: Diagnosis not present

## 2019-02-10 DIAGNOSIS — K219 Gastro-esophageal reflux disease without esophagitis: Secondary | ICD-10-CM | POA: Diagnosis not present

## 2019-02-10 DIAGNOSIS — N816 Rectocele: Secondary | ICD-10-CM | POA: Diagnosis not present

## 2019-02-10 LAB — BASIC METABOLIC PANEL
Anion gap: 10 (ref 5–15)
BUN: 9 mg/dL (ref 8–23)
CO2: 24 mmol/L (ref 22–32)
Calcium: 9 mg/dL (ref 8.9–10.3)
Chloride: 104 mmol/L (ref 98–111)
Creatinine, Ser: 0.62 mg/dL (ref 0.44–1.00)
GFR calc Af Amer: >60 mL/min (ref >60)
GFR calc non Af Amer: >60 mL/min (ref >60)
Glucose, Bld: 130 mg/dL — ABNORMAL HIGH (ref 70–99)
Potassium: 4.3 mmol/L (ref 3.5–5.1)
Sodium: 138 mmol/L (ref 135–145)

## 2019-02-10 LAB — HEMOGLOBIN AND HEMATOCRIT, BLOOD
HCT: 37.1 % (ref 36.0–46.0)
Hemoglobin: 11.6 g/dL — ABNORMAL LOW (ref 12.0–15.0)

## 2019-02-10 NOTE — Discharge Summary (Signed)
[facility] Discharge Summary  Admit date: 02/09/2019  Discharge date and time: 02/10/19   Discharge to: Home  Discharge Service: Urology  Discharge Attending Physician:  Dr. Nicki Reaper MacDiarmid  Discharge  Diagnoses: Cystocele and Rectocele  Secondary Diagnosis: Active Problems:   Cystocele with prolapse  OR Procedures: Procedure(s): ANTERIOR (CYSTOCELE) AND POSTERIOR REPAIR (RECTOCELE) CYSTOSCOPY 02/09/2019   Ancillary Procedures: None   Discharge Day Services: The patient was seen and examined by the Urology team both in the morning and immediately prior to discharge.  Vital signs and laboratory values were stable and within normal limits. Vaginal packing was removed. Foley catheter was removed. The physical exam was benign and unchanged and all surgical wounds were examined.  Discharge instructions were explained and all questions answered.  Subjective  No acute events overnight. Pain Controlled. No fever or chills.  Objective Patient Vitals for the past 8 hrs:  BP Temp Pulse Resp SpO2  02/10/19 0433 (!) 107/56 97.7 F (36.5 C) (!) 57 16 93 %  02/10/19 0131 (!) 109/52 98.1 F (36.7 C) (!) 56 18 95 %   No intake/output data recorded.  General Appearance:        No acute distress Lungs:                       Normal work of breathing on room air Heart:                                Regular rate and rhythm Abdomen:                         Soft, non-tender, non-distended GU:        Vaginal packing removed. No bleeding.  Extremities:                      Warm and well perfused   Hospital Course:  The patient underwent anterior (cystocele) and posterior (rectocele) prolapse repair with cystoscopy on 02/09/2019.  The patient tolerated the procedure well, was extubated in the OR, and afterwards was taken to the PACU for routine post-surgical care. When stable the patient was transferred to the floor.   The patient did well postoperatively.  The patients diet was slowly  advanced and at the time of discharge was tolerating a regular diet. Vaginal packing was removed on POD1 and Foley catheter was removed on POD1. The patient was discharged home 1 Day Post-Op, at which point was tolerating a regular solid diet, have adequate pain control with P.O. pain medication, and could ambulate without difficulty. The patient will follow up with [place] for post op check.   Condition at Discharge: Improved  Discharge Medications:  Allergies as of 02/10/2019      Reactions   Cefuroxime Nausea And Vomiting   Red splotches on face.      Medication List    STOP taking these medications   aspirin-sod bicarb-citric acid 325 MG Tbef tablet Commonly known as: ALKA-SELTZER     TAKE these medications   diphenhydrAMINE 25 MG tablet Commonly known as: BENADRYL Take 25 mg by mouth daily as needed for allergies.   HYDROcodone-acetaminophen 5-325 MG tablet Commonly known as: Norco Take 1-2 tablets by mouth every 6 (six) hours as needed for moderate pain.   Intrarosa 6.5 MG Inst Generic drug: Prasterone Place 1 ampule vaginally at bedtime.   levothyroxine 75  MCG tablet Commonly known as: SYNTHROID TAKE 1 TABLET BY MOUTH ONCE DAILY BEFORE BREAKFAST What changed: See the new instructions.   Melatonin 10 MG Caps Take 20 mg by mouth 2 (two) times daily. Around 1900 and 2100   Prevacid 15 MG capsule Generic drug: lansoprazole Take 15 mg by mouth daily as needed (acid reflux).

## 2019-02-10 NOTE — Progress Notes (Signed)
Dr. Matilde Sprang called about patient's inability to void post catheter removal.  Bladder scan showed 177cc. Pt drinking as instructed.  Wished to go home and will call the office this afternoon if she continues to be unable to void.

## 2019-02-10 NOTE — Discharge Summary (Signed)
Date of admission: 02/09/2019  Date of discharge: 02/10/2019  Admission diagnosis: midline cystocele  Discharge diagnosis: midline cystocele  Secondary diagnoses: rectocele  History and Physical: For full details, please see admission history and physical. Briefly, Natasha Wilson is a 68 y.o. year old patient with the above diagnosis.   Hospital Course: cystocele and rectocele repair. Excellent post op course  Laboratory values:  Recent Labs    02/08/19 0945 02/09/19 1543 02/10/19 0539  HGB 12.9 11.7* 11.6*  HCT 42.1 38.5 37.1   Recent Labs    02/08/19 0945 02/10/19 0539  CREATININE 0.65 0.62    Disposition: Home  Discharge instruction: The patient was instructed to be ambulatory but told to refrain from heavy lifting, strenuous activity, or driving. Detailed  Discharge medications:    Followup:  Follow-up Information    Danicia Terhaar, Nicki Reaper, MD.   Specialty: Urology Why: office will call you with date and time of appt.  Contact information: [place] [phone]

## 2019-02-11 ENCOUNTER — Emergency Department (HOSPITAL_COMMUNITY): Payer: Medicare HMO

## 2019-02-11 ENCOUNTER — Encounter (HOSPITAL_COMMUNITY): Payer: Self-pay | Admitting: Emergency Medicine

## 2019-02-11 ENCOUNTER — Other Ambulatory Visit: Payer: Self-pay

## 2019-02-11 ENCOUNTER — Emergency Department (HOSPITAL_COMMUNITY)
Admission: EM | Admit: 2019-02-11 | Discharge: 2019-02-11 | Disposition: A | Discharge status: Home / Self Care | Payer: Medicare HMO | Attending: Emergency Medicine | Admitting: Emergency Medicine

## 2019-02-11 DIAGNOSIS — J9811 Atelectasis: Secondary | ICD-10-CM | POA: Diagnosis not present

## 2019-02-11 DIAGNOSIS — R079 Chest pain, unspecified: Secondary | ICD-10-CM | POA: Diagnosis not present

## 2019-02-11 DIAGNOSIS — Z87891 Personal history of nicotine dependence: Secondary | ICD-10-CM | POA: Insufficient documentation

## 2019-02-11 DIAGNOSIS — Z79899 Other long term (current) drug therapy: Secondary | ICD-10-CM | POA: Insufficient documentation

## 2019-02-11 DIAGNOSIS — R0789 Other chest pain: Secondary | ICD-10-CM | POA: Diagnosis not present

## 2019-02-11 DIAGNOSIS — R112 Nausea with vomiting, unspecified: Secondary | ICD-10-CM | POA: Insufficient documentation

## 2019-02-11 DIAGNOSIS — I700 Atherosclerosis of aorta: Secondary | ICD-10-CM | POA: Diagnosis not present

## 2019-02-11 LAB — CBC
HCT: 36.2 % (ref 36.0–46.0)
Hemoglobin: 11.2 g/dL — ABNORMAL LOW (ref 12.0–15.0)
MCH: 26 pg (ref 26.0–34.0)
MCHC: 30.9 g/dL (ref 30.0–36.0)
MCV: 84.2 fL (ref 80.0–100.0)
Platelets: 224 10*3/uL (ref 150–400)
RBC: 4.3 MIL/uL (ref 3.87–5.11)
RDW: 13.5 % (ref 11.5–15.5)
WBC: 12.8 10*3/uL — ABNORMAL HIGH (ref 4.0–10.5)
nRBC: 0 % (ref 0.0–0.2)

## 2019-02-11 LAB — BASIC METABOLIC PANEL
Anion gap: 9 (ref 5–15)
BUN: 10 mg/dL (ref 8–23)
CO2: 27 mmol/L (ref 22–32)
Calcium: 8.9 mg/dL (ref 8.9–10.3)
Chloride: 94 mmol/L — ABNORMAL LOW (ref 98–111)
Creatinine, Ser: 0.63 mg/dL (ref 0.44–1.00)
GFR calc Af Amer: >60 mL/min (ref >60)
GFR calc non Af Amer: >60 mL/min (ref >60)
Glucose, Bld: 130 mg/dL — ABNORMAL HIGH (ref 70–99)
Potassium: 3.6 mmol/L (ref 3.5–5.1)
Sodium: 130 mmol/L — ABNORMAL LOW (ref 135–145)

## 2019-02-11 LAB — TROPONIN I (HIGH SENSITIVITY)
Troponin I (High Sensitivity): 4 ng/L (ref <18)
Troponin I (High Sensitivity): 6 ng/L (ref <18)

## 2019-02-11 MED ORDER — IOHEXOL 350 MG/ML SOLN
100.0000 mL | Freq: Once | INTRAVENOUS | Status: AC | PRN
  Administered 2019-02-11: 100 mL via INTRAVENOUS

## 2019-02-11 MED ORDER — SODIUM CHLORIDE (PF) 0.9 % IJ SOLN
INTRAMUSCULAR | Status: AC
  Filled 2019-02-11: qty 50

## 2019-02-11 MED ORDER — SODIUM CHLORIDE 0.9% FLUSH: 3.0000 mL | Freq: Once | INTRAVENOUS | Status: DC

## 2019-02-11 MED ORDER — ALUM & MAG HYDROXIDE-SIMETH 200-200-20 MG/5ML PO SUSP
30.0000 mL | Freq: Once | ORAL | Status: AC
  Administered 2019-02-11: 30 mL via ORAL
  Filled 2019-02-11: qty 30

## 2019-02-11 MED ORDER — ONDANSETRON HCL 4 MG/2ML IJ SOLN
4.0000 mg | Freq: Once | INTRAMUSCULAR | Status: AC
  Administered 2019-02-11: 4 mg via INTRAVENOUS
  Filled 2019-02-11: qty 2

## 2019-02-11 MED ORDER — SODIUM CHLORIDE 0.9 % IV BOLUS
1000.0000 mL | Freq: Once | INTRAVENOUS | Status: AC
  Administered 2019-02-11: 1000 mL via INTRAVENOUS

## 2019-02-11 NOTE — ED Triage Notes (Signed)
Patient complaining of mid chest pain. Patient states it hurts to breathe and has vomited. Patient states she had surgery yesterday (gall bladder). Patient was intubated.

## 2019-02-11 NOTE — Discharge Instructions (Signed)
Try zantac or pepcid twice a day.  Try to avoid things that may make this worse, most commonly these are spicy foods tomato based products fatty foods chocolate and peppermint.  Alcohol and tobacco can also make this worse.  Return to the emergency department for sudden worsening pain fever or inability to eat or drink. ° °

## 2019-02-11 NOTE — ED Provider Notes (Signed)
[name] Provider Note   CSN: [id number] Arrival date & time: 02/11/19  [id number]     History Chief Complaint  Patient presents with   Chest Pain    Natasha Wilson is a 68 y.o. female.  68 yo F with a chief complaints of chest pain.  Described as sharp worse with deep breathing.  Patient had surgery a couple days ago for a prolapsed rectum and bladder.  She has some mild discomfort down there, denies any significant bleeding.  Denies abdominal pain.  Not exertional.  Had one episode of vomiting.  Has not had any thing to eat and drink for about 12 hours.  No fevers.  Denies history of MI.  Denies history of hypertension hyperlipidemia diabetes or smoking.  Her father had an MI in his early 41s.  She denies history of PE or DVT denies unilateral lower extremity edema denies hemoptysis. Denies hx of CA.   The history is provided by the patient.  Chest Pain Pain radiates to:  Does not radiate Pain severity:  No pain Onset quality:  Gradual Timing:  Constant Progression:  Worsening Chronicity:  New Context: breathing   Relieved by:  Nothing Worsened by:  Nothing Ineffective treatments:  None tried Associated symptoms: nausea and vomiting   Associated symptoms: no abdominal pain, no dizziness, no fever, no headache, no palpitations and no shortness of breath        Past Medical History:  Diagnosis Date   Generalized anxiety disorder 05/24/2016   GERD (gastroesophageal reflux disease)    History of benign essential tremor 10/24/2015   Thyroid activity decreased 11/10/2015    Patient Active Problem List   Diagnosis Date Noted   Cystocele with prolapse 02/09/2019   Postmenopausal atrophic vaginitis 08/11/2018   Prolapse of anterior vaginal wall 08/11/2018   Posterior vaginal wall prolapse 08/11/2018   Onychomycosis 11/01/2016   History of chronic eczema 11/01/2016   Generalized anxiety disorder 05/24/2016   S/P colonoscopy  12/20/2015   Thyroid activity decreased 11/10/2015   Relationship problem between parent and child 10/24/2015   Annual physical exam 10/24/2015   History of benign essential tremor 10/24/2015   History of hysterectomy for benign disease 10/24/2015    Past Surgical History:  Procedure Laterality Date   ABDOMINAL HYSTERECTOMY     ANTERIOR AND POSTERIOR REPAIR N/A 02/09/2019   Procedure: ANTERIOR (CYSTOCELE) AND POSTERIOR REPAIR (RECTOCELE);  Surgeon: Bjorn Loser, MD;  Location: [facility];  Service: Urology;  Laterality: N/A;   BREAST CYST EXCISION     CHOLECYSTECTOMY     CYSTOSCOPY N/A 02/09/2019   Procedure: CYSTOSCOPY;  Surgeon: Bjorn Loser, MD;  Location: [facility];  Service: Urology;  Laterality: N/A;     OB History    Gravida  2   Para  2   Term  2   Preterm      AB      Living  2     SAB      TAB      Ectopic      Multiple      Live Births              Family History  Problem Relation Age of Onset   Alcohol abuse Father    Heart attack Father    Cancer Sister        LUNG   Depression Paternal Grandfather     Social History   Tobacco Use   Smoking status: Former Smoker  Quit date: 25    Years since quitting: 25.9   Smokeless tobacco: Never Used  Substance Use Topics   Alcohol use: Yes    Alcohol/week: 4.0 standard drinks    Types: 4 Glasses of wine per week    Comment: 4 ounces a night   Drug use: Never    Home Medications Prior to Admission medications   Medication Sig Start Date End Date Taking? Authorizing Provider  acetaminophen (TYLENOL) 500 MG tablet Take 1,000 mg by mouth every 6 (six) hours as needed for moderate pain.   Yes [provider]  diphenhydrAMINE (BENADRYL) 25 MG tablet Take 25 mg by mouth daily as needed for allergies.   Yes [provider]  HYDROcodone-acetaminophen (NORCO) 5-325 MG tablet Take 1-2 tablets by mouth every 6 (six) hours as needed for moderate pain. 02/09/19   Yes Dancy, Estill Bamberg, PA-C  lansoprazole (PREVACID) 15 MG capsule Take 15 mg by mouth daily as needed (acid reflux).   Yes [provider]  levothyroxine (SYNTHROID) 75 MCG tablet TAKE 1 TABLET BY MOUTH ONCE DAILY BEFORE BREAKFAST Patient taking differently: Take 75 mcg by mouth daily before breakfast.  12/14/18  Yes Emeterio Reeve, DO  Melatonin 10 MG CAPS Take 10-20 mg by mouth at bedtime as needed (sleep). Around 1900 and 2100    Yes [provider]  Prasterone (INTRAROSA) 6.5 MG INST Place 1 ampule vaginally at bedtime. Patient not taking: Reported on 01/29/2019 10/01/18   Emily Filbert, MD    Allergies    Cefuroxime  Review of Systems   Review of Systems  Constitutional: Negative for chills and fever.  HENT: Negative for congestion and rhinorrhea.   Eyes: Negative for redness and visual disturbance.  Respiratory: Negative for shortness of breath and wheezing.   Cardiovascular: Positive for chest pain. Negative for palpitations.  Gastrointestinal: Positive for nausea and vomiting. Negative for abdominal pain.  Genitourinary: Positive for pelvic pain. Negative for dysuria and urgency.  Musculoskeletal: Negative for arthralgias and myalgias.  Skin: Negative for pallor and wound.  Neurological: Negative for dizziness and headaches.    Physical Exam Updated Vital Signs BP (!) 122/54    Pulse 79    Temp 98.5 F (36.9 C) (Oral)    Resp (!) 28    Ht 5\' 4"  (1.626 m)    Wt 72.6 kg    SpO2 96%    BMI 27.46 kg/m   Physical Exam Vitals and nursing note reviewed.  Constitutional:      General: She is not in acute distress.    Appearance: She is well-developed. She is not diaphoretic.  HENT:     Head: Normocephalic and atraumatic.  Eyes:     Pupils: Pupils are equal, round, and reactive to light.  Cardiovascular:     Rate and Rhythm: Normal rate and regular rhythm.     Heart sounds: No murmur. No friction rub. No gallop.   Pulmonary:     Effort: Pulmonary effort is  normal.     Breath sounds: No decreased breath sounds, wheezing or rales.  Abdominal:     General: There is no distension.     Palpations: Abdomen is soft.     Tenderness: There is no abdominal tenderness.  Musculoskeletal:        General: No tenderness.     Cervical back: Normal range of motion and neck supple.  Skin:    General: Skin is warm and dry.  Neurological:     Mental Status: She  is alert and oriented to person, place, and time.  Psychiatric:        Behavior: Behavior normal.     ED Results / Procedures / Treatments   Labs (all labs ordered are listed, but only abnormal results are displayed) Labs Reviewed  BASIC METABOLIC PANEL - Abnormal; Notable for the following components:      Result Value   Sodium 130 (*)    Chloride 94 (*)    Glucose, Bld 130 (*)    All other components within normal limits  CBC - Abnormal; Notable for the following components:   WBC 12.8 (*)    Hemoglobin 11.2 (*)    All other components within normal limits  TROPONIN I (HIGH SENSITIVITY)  TROPONIN I (HIGH SENSITIVITY)    EKG EKG Interpretation  Date/Time:  Thursday February 11 2019 02:55:26 EST Ventricular Rate:  69 PR Interval:    QRS Duration: 102 QT Interval:  405 QTC Calculation: 434 R Axis:   59 Text Interpretation: Sinus rhythm Borderline prolonged PR interval Low voltage, precordial leads No previous ECGs available Confirmed by Shanon Rosser [id number]) on 02/11/2019 4:35:36 AM   Radiology DG Chest 2 View  Result Date: 02/11/2019 CLINICAL DATA:  Chest pain EXAM: CHEST - 2 VIEW COMPARISON:  None. FINDINGS: The heart size and mediastinal contours are within normal limits. Bibasilar atelectasis. The visualized skeletal structures are unremarkable. IMPRESSION: Bibasilar atelectasis. Electronically Signed   By: Ulyses Jarred M.D.   On: 02/11/2019 03:58   CT Angio Chest PE W and/or Wo Contrast  Result Date: 02/11/2019 CLINICAL DATA:  High pretest probability for pulmonary  embolism EXAM: CT ANGIOGRAPHY CHEST WITH CONTRAST TECHNIQUE: Multidetector CT imaging of the chest was performed using the standard protocol during bolus administration of intravenous contrast. Multiplanar CT image reconstructions and MIPs were obtained to evaluate the vascular anatomy. CONTRAST:  185mL OMNIPAQUE IOHEXOL 350 MG/ML SOLN COMPARISON:  None. FINDINGS: Cardiovascular: Satisfactory opacification of the pulmonary arteries to the segmental level. No evidence of pulmonary embolism when accounting for motion artifact. Normal heart size. No pericardial effusion. Coronary calcification along the LAD. Atherosclerotic calcification at the arch and descending segment. Mediastinum/Nodes: Negative for adenopathy or mass. Lungs/Pleura: Bands of opacity in the dependent lung with volume loss consistent with atelectasis. There is a 6 mm average diameter subpleural pulmonary nodule in the right lower lobe on 6:45. Upper Abdomen: Negative Musculoskeletal: No acute or aggressive finding Review of the MIP images confirms the above findings. IMPRESSION: 1. Negative for pulmonary embolism. 2. Low volume chest with atelectasis at the bases. 3.  Aortic Atherosclerosis (ICD10-I70.0).  Coronary atherosclerosis. 4. 6 mm right lower lobe pulmonary nodule. Non-contrast chest CT at 6-12 months is recommended. If the nodule is stable at time of repeat CT, then future CT at 18-24 months (from today's scan) is considered optional for low-risk patients, but is recommended for high-risk patients. This recommendation follows the consensus statement: Guidelines for Management of Incidental Pulmonary Nodules Detected on CT Images: From the Fleischner Society 2017; Radiology 2017; 284:228-243. Electronically Signed   By: Monte Fantasia M.D.   On: 02/11/2019 08:16    Procedures Procedures (including critical care time)  Medications Ordered in ED Medications  sodium chloride flush (NS) 0.9 % injection 3 mL (3 mLs Intravenous Not Given  02/11/19 0726)  sodium chloride (PF) 0.9 % injection (has no administration in time range)  ondansetron (ZOFRAN) injection 4 mg (4 mg Intravenous Given 02/11/19 0725)  sodium chloride 0.9 % bolus 1,000 mL (0  mLs Intravenous Stopped 02/11/19 0832)  alum & mag hydroxide-simeth (MAALOX/MYLANTA) 200-200-20 MG/5ML suspension 30 mL (30 mLs Oral Given 02/11/19 0725)  iohexol (OMNIPAQUE) 350 MG/ML injection 100 mL (100 mLs Intravenous Contrast Given 02/11/19 [id number])    ED Course  I have reviewed the triage vital signs and the nursing notes.  Pertinent labs & imaging results that were available during my care of the patient were reviewed by me and considered in my medical decision making (see chart for details).    MDM Rules/Calculators/A&P     CHA2DS2/VAS Stroke Risk Points      N/A >= 2 Points: High Risk  1 - 1.99 Points: Medium Risk  0 Points: Low Risk    A final score could not be computed because of missing components.: Last  Change: N/A     This score determines the patient's risk of having a stroke if the  patient has atrial fibrillation.      This score is not applicable to this patient. Components are not  calculated.                   68 yo F with a chief complaints of Chest pain.  Going on post surgery.  Pain is atypical from MI.  She has 2 troponins that are negative.  There is some concern for a pulmonary embolism as the pain is pleuritic and is postop.  Will obtain a CT angiogram of the chest.  Give a GI cocktail bolus of IV fluids for hyponatremia and reassess.  CT scan of the chest is negative for pulmonary embolism.  There is a lung nodule which I talked to the patient about.  She also had signs of coronary artery disease.  I also discussed this with her.  She will follow-up with her doctor for the pulmonary nodule and the other CT findings.  She had some significant improvement with Maalox.  We will have her try Zantac or Pepcid at home.  Also seen on CT scan was atelectasis.   This soon after surgery that is likely to be expected but I will give her incentive spirometer.  9:05 AM:  I have discussed the diagnosis/risks/treatment options with the patient and family and believe the pt to be eligible for discharge home to follow-up with PCP. We also discussed returning to the ED immediately if new or worsening sx occur. We discussed the sx which are most concerning (e.g., sudden worsening pain, fever, inability to tolerate by mouth) that necessitate immediate return. Medications administered to the patient during their visit and any new prescriptions provided to the patient are listed below.  Medications given during this visit Medications  sodium chloride flush (NS) 0.9 % injection 3 mL (3 mLs Intravenous Not Given 02/11/19 0726)  sodium chloride (PF) 0.9 % injection (has no administration in time range)  ondansetron (ZOFRAN) injection 4 mg (4 mg Intravenous Given 02/11/19 0725)  sodium chloride 0.9 % bolus 1,000 mL (0 mLs Intravenous Stopped 02/11/19 0832)  alum & mag hydroxide-simeth (MAALOX/MYLANTA) 200-200-20 MG/5ML suspension 30 mL (30 mLs Oral Given 02/11/19 0725)  iohexol (OMNIPAQUE) 350 MG/ML injection 100 mL (100 mLs Intravenous Contrast Given 02/11/19 0752)     The patient appears reasonably screen and/or stabilized for discharge and I doubt any other medical condition or other [facility] requiring further screening, evaluation, or treatment in the ED at this time prior to discharge.    Final Clinical Impression(s) / ED Diagnoses Final diagnoses:  Atypical chest pain  Atelectasis  Rx / DC Orders ED Discharge Orders    None       Deno Etienne, [place] 02/11/19 [id number]

## 2019-02-15 ENCOUNTER — Encounter: Payer: Self-pay | Admitting: Osteopathic Medicine

## 2019-02-15 NOTE — Progress Notes (Signed)
Subjective:   Natasha Wilson is a 68 y.o. female who presents for Medicare Annual (Subsequent) preventive examination.  Review of Systems:  No ROS.  Medicare Wellness Virtual Visit.  Visual/audio telehealth visit, UTA vital signs.   See social history for additional risk factors.    Cardiac Risk Factors include: advanced age (>52men, >15 women) Sleep patterns:Getting 4-6 hours of sleep a night. Gets up 3-4 times a night to void. Wakes up and feels rested and ready for the day.  Home Safety/Smoke Alarms: Feels safe in home. Smoke alarms in place.  Living environment; Lives alone in a 1 story condo and stairs have hand rails on them. Shower is a step over tub combo and anti slip mat in place. Seat Belt Safety/Bike Helmet: Wears seat belt.   Female:   Pap- Aged out      Mammo- UTD      Dexa scan-  Ordered while on phone for visit      CCS- UTD     Objective:     Vitals: There were no vitals taken for this visit.  There is no height or weight on file to calculate BMI.  Advanced Directives 02/22/2019 02/11/2019 02/09/2019 02/08/2019 02/16/2018 10/24/2015  Does Patient Have a Medical Advance Directive? Yes Yes Yes Yes Yes No  Type of Paramedic of [place];Living will [place];Living will Healthcare Power of [place] of [place];Living will -  Does patient want to make changes to medical advance directive? No - Patient declined No - Patient declined No - Patient declined - Yes (MAU/Ambulatory/Procedural Areas - Information given) -  Copy of [place] in Chart? Yes - validated most recent copy scanned in chart (See row information) No - copy requested Yes - validated most recent copy scanned in chart (See row information) Yes - validated most recent copy scanned in chart (See row information) No - copy requested -  Would patient like information on creating a medical advance directive?  No - Patient declined No - Patient declined - - - Yes - Scientist, clinical (histocompatibility and immunogenetics) given    Tobacco Social History   Tobacco Use  Smoking Status Former Smoker  . Quit date: 35  . Years since quitting: 25.9  Smokeless Tobacco Never Used     Counseling given: Not Answered   Clinical Intake:  Pre-visit preparation completed: Yes  Pain : No/denies pain Pain Score: 0-No pain     Nutritional Risks: None Diabetes: No  How often do you need to have someone help you when you read instructions, pamphlets, or other written materials from your doctor or pharmacy?: 1 - Never What is the last grade level you completed in school?: 14  Interpreter Needed?: No  Information entered by :: Orlie Dakin, LPN  Past Medical History:  Diagnosis Date  . Generalized anxiety disorder 05/24/2016  . GERD (gastroesophageal reflux disease)   . History of benign essential tremor 10/24/2015  . Thyroid activity decreased 11/10/2015   Past Surgical History:  Procedure Laterality Date  . ABDOMINAL HYSTERECTOMY    . ANTERIOR AND POSTERIOR REPAIR N/A 02/09/2019   Procedure: ANTERIOR (CYSTOCELE) AND POSTERIOR REPAIR (RECTOCELE);  Surgeon: Bjorn Loser, MD;  Location: [facility];  Service: Urology;  Laterality: N/A;  . bladder tack    . BREAST CYST EXCISION    . CHOLECYSTECTOMY    . CYSTOSCOPY N/A 02/09/2019   Procedure: CYSTOSCOPY;  Surgeon: Bjorn Loser, MD;  Location: [facility];  Service: Urology;  Laterality: N/A;   Family History  Problem Relation Age of Onset  . Alcohol abuse Father   . Heart attack Father   . Cancer Sister        LUNG  . Depression Paternal Grandfather    Social History   Socioeconomic History  . Marital status: Single    Spouse name: Not on file  . Number of children: 2  . Years of education: 71  . Highest education level: Associate degree: academic program  Occupational History  . Occupation: retired    Comment: [employer]  Tobacco Use  . Smoking status: Former  Smoker    Quit date: 1995    Years since quitting: 25.9  . Smokeless tobacco: Never Used  Substance and Sexual Activity  . Alcohol use: Yes    Alcohol/week: 4.0 standard drinks    Types: 4 Glasses of wine per week    Comment: 4 ounces a night  . Drug use: Never  . Sexual activity: Not Currently  Other Topics Concern  . Not on file  Social History Narrative   Runs errands. Watch TV. Reads a lot and plays games on the computer.  Watches grandson some during the week as well. Had a bladder tac this year so will get back into walking this year.   Social Determinants of Health   Financial Resource Strain:   . Difficulty of Paying Living Expenses: Not on file  Food Insecurity:   . Worried About Charity fundraiser in the Last Year: Not on file  . Ran Out of Food in the Last Year: Not on file  Transportation Needs:   . Lack of Transportation (Medical): Not on file  . Lack of Transportation (Non-Medical): Not on file  Physical Activity:   . Days of Exercise per Week: Not on file  . Minutes of Exercise per Session: Not on file  Stress:   . Feeling of Stress : Not on file  Social Connections:   . Frequency of Communication with Friends and Family: Not on file  . Frequency of Social Gatherings with Friends and Family: Not on file  . Attends Religious Services: Not on file  . Active Member of Clubs or Organizations: Not on file  . Attends Archivist Meetings: Not on file  . Marital Status: Not on file    Outpatient Encounter Medications as of 02/22/2019  Medication Sig  . acetaminophen (TYLENOL) 500 MG tablet Take 1,000 mg by mouth every 6 (six) hours as needed for moderate pain.  . diphenhydrAMINE (BENADRYL) 25 MG tablet Take 25 mg by mouth daily as needed for allergies.  Marland Kitchen lansoprazole (PREVACID) 15 MG capsule Take 15 mg by mouth daily as needed (acid reflux).  Marland Kitchen levothyroxine (SYNTHROID) 75 MCG tablet TAKE 1 TABLET BY MOUTH ONCE DAILY BEFORE BREAKFAST (Patient taking  differently: Take 75 mcg by mouth daily before breakfast. )  . Melatonin 10 MG CAPS Take 10-20 mg by mouth at bedtime as needed (sleep). Around 1900 and 2100   . HYDROcodone-acetaminophen (NORCO) 5-325 MG tablet Take 1-2 tablets by mouth every 6 (six) hours as needed for moderate pain. (Patient not taking: Reported on 02/22/2019)  . Prasterone (INTRAROSA) 6.5 MG INST Place 1 ampule vaginally at bedtime. (Patient not taking: Reported on 01/29/2019)   No facility-administered encounter medications on file as of 02/22/2019.    Activities of Daily Living In your present state of health, do you have any difficulty performing the following activities: 02/22/2019  02/09/2019  Hearing? N -  Vision? N -  Difficulty concentrating or making decisions? N -  Walking or climbing stairs? N -  Dressing or bathing? N -  Doing errands, shopping? N N  Preparing Food and eating ? N -  Using the Toilet? N -  In the past six months, have you accidently leaked urine? N -  Do you have problems with loss of bowel control? N -  Managing your Medications? N -  Managing your Finances? N -  Housekeeping or managing your Housekeeping? N -  Some recent data might be hidden    Patient Care Team: Emeterio Reeve, DO as PCP - General (Osteopathic Medicine)    Assessment:   This is a routine wellness examination for Lucelia.Physical assessment deferred to PCP.   Exercise Activities and Dietary recommendations Current Exercise Habits: The patient does not participate in regular exercise at present(due to bladder surgery), Exercise limited by: None identified Diet Eats a fairly healthy diet of vegetables, fruits and protein.Encouraged to take calcium supplement Breakfast: 1 egg, toast and sausage. Coffee Lunch: vegetables, meat or a salad, beans Dinner: skips      Goals    . Exercise 3x per week (30 min per time)     Increase walking to 3 times a week for at least 30 minutes a day    . Exercise 3x per week (30  min per time)     Start back exercising and walking daily.       Fall Risk Fall Risk  02/22/2019 02/16/2018 11/01/2016  Falls in the past year? 0 0 No  Number falls in past yr: 0 - -  Injury with Fall? 0 - -  Follow up Falls prevention discussed Falls prevention discussed -   Is the patient's home free of loose throw rugs in walkways, pet beds, electrical cords, etc?   yes      Grab bars in the bathroom? no      Handrails on the stairs?   yes      Adequate lighting?   yes   Depression Screen PHQ 2/9 Scores 02/22/2019 02/16/2018 01/02/2018 11/01/2016  PHQ - 2 Score 0 1 1 2   PHQ- 9 Score - 1 8 8      Cognitive Function     6CIT Screen 02/22/2019 02/16/2018  What Year? 0 points 0 points  What month? 0 points 0 points  What time? 0 points 0 points  Count back from 20 0 points 0 points  Months in reverse 0 points 0 points  Repeat phrase 2 points 0 points  Total Score 2 0    Immunization History  Administered Date(s) Administered  . Tdap 02/08/2006, 11/01/2016    Screening Tests Health Maintenance  Topic Date Due  . PNA vac Low Risk Adult (1 of 2 - PCV13) 10/18/2015  . INFLUENZA VACCINE  10/03/2018  . COLONOSCOPY  08/08/2020  . MAMMOGRAM  12/08/2020  . TETANUS/TDAP  11/02/2026  . DEXA SCAN  Completed  . Hepatitis C Screening  Completed        Plan:    Please schedule your next medicare wellness visit with me in 1 yr.  Ms. Barham , Thank you for taking time to come for your Medicare Wellness Visit. I appreciate your ongoing commitment to your health goals. Please review the following plan we discussed and let me know if I can assist you in the future.  Continue doing brain stimulating activities (puzzles, reading, adult coloring books, staying active)  to keep memory sharp.    These are the goals we discussed: Goals    . Exercise 3x per week (30 min per time)     Increase walking to 3 times a week for at least 30 minutes a day    . Exercise 3x per week (30 min  per time)     Start back exercising and walking daily.       This is a list of the screening recommended for you and due dates:  Health Maintenance  Topic Date Due  . Pneumonia vaccines (1 of 2 - PCV13) 10/18/2015  . Flu Shot  10/03/2018  . Colon Cancer Screening  08/08/2020  . Mammogram  12/08/2020  . Tetanus Vaccine  11/02/2026  . DEXA scan (bone density measurement)  Completed  .  Hepatitis C: One time screening is recommended by Center for Disease Control  (CDC) for  adults born from 2 through 1965.   Completed      I have personally reviewed and noted the following in the patient's chart:   . Medical and social history . Use of alcohol, tobacco or illicit drugs  . Current medications and supplements . Functional ability and status . Nutritional status . Physical activity . Advanced directives . List of other physicians . Hospitalizations, surgeries, and ER visits in previous 12 months . Vitals . Screenings to include cognitive, depression, and falls . Referrals and appointments  In addition, I have reviewed and discussed with patient certain preventive protocols, quality metrics, and best practice recommendations. A written personalized care plan for preventive services as well as general preventive health recommendations were provided to patient.     Joanne Chars, LPN  [id number]

## 2019-02-18 DIAGNOSIS — R3914 Feeling of incomplete bladder emptying: Secondary | ICD-10-CM | POA: Diagnosis not present

## 2019-02-22 ENCOUNTER — Ambulatory Visit (INDEPENDENT_AMBULATORY_CARE_PROVIDER_SITE_OTHER): Payer: Medicare HMO | Admitting: *Deleted

## 2019-02-22 VITALS — Ht 64.0 in | Wt 156.0 lb

## 2019-02-22 DIAGNOSIS — Z0000 Encounter for general adult medical examination without abnormal findings: Secondary | ICD-10-CM

## 2019-02-22 DIAGNOSIS — Z780 Asymptomatic menopausal state: Secondary | ICD-10-CM

## 2019-02-22 DIAGNOSIS — Z13820 Encounter for screening for osteoporosis: Secondary | ICD-10-CM

## 2019-02-22 NOTE — Patient Instructions (Signed)
Please schedule your next medicare wellness visit with me in 1 yr.  Natasha Wilson , Thank you for taking time to come for your Medicare Wellness Visit. I appreciate your ongoing commitment to your health goals. Please review the following plan we discussed and let me know if I can assist you in the future.  Continue doing brain stimulating activities (puzzles, reading, adult coloring books, staying active) to keep memory sharp.   These are the goals we discussed: Goals    . Exercise 3x per week (30 min per time)     Increase walking to 3 times a week for at least 30 minutes a day    . Exercise 3x per week (30 min per time)     Start back exercising and walking daily.

## 2019-03-03 ENCOUNTER — Ambulatory Visit (INDEPENDENT_AMBULATORY_CARE_PROVIDER_SITE_OTHER): Payer: Medicare HMO

## 2019-03-03 ENCOUNTER — Other Ambulatory Visit: Payer: Self-pay

## 2019-03-03 DIAGNOSIS — Z780 Asymptomatic menopausal state: Secondary | ICD-10-CM

## 2019-03-03 DIAGNOSIS — Z13820 Encounter for screening for osteoporosis: Secondary | ICD-10-CM | POA: Diagnosis not present

## 2019-03-03 DIAGNOSIS — M8589 Other specified disorders of bone density and structure, multiple sites: Secondary | ICD-10-CM | POA: Diagnosis not present

## 2019-03-10 ENCOUNTER — Telehealth: Payer: Self-pay | Admitting: Nurse Practitioner

## 2019-03-10 NOTE — Telephone Encounter (Signed)
Patient called and was having some elevated blood pressure readings and some chest discomfort. She was advised to come in for a visit and declined. She also stated "this is not serious to me and I don't feel I need to come in today". I advised her if the pain was worse that she would need to be evaluated at urgent care or emergency room if the pain was worse before then. No other questions.

## 2019-03-11 ENCOUNTER — Ambulatory Visit (INDEPENDENT_AMBULATORY_CARE_PROVIDER_SITE_OTHER): Payer: Medicare HMO | Admitting: Nurse Practitioner

## 2019-03-11 ENCOUNTER — Other Ambulatory Visit: Payer: Self-pay

## 2019-03-11 ENCOUNTER — Encounter: Payer: Self-pay | Admitting: Nurse Practitioner

## 2019-03-11 VITALS — BP 121/67 | HR 67 | Temp 97.9°F | Ht 64.0 in | Wt 161.1 lb

## 2019-03-11 DIAGNOSIS — R079 Chest pain, unspecified: Secondary | ICD-10-CM | POA: Diagnosis not present

## 2019-03-11 DIAGNOSIS — Z0130 Encounter for examination of blood pressure without abnormal findings: Secondary | ICD-10-CM

## 2019-03-11 DIAGNOSIS — E785 Hyperlipidemia, unspecified: Secondary | ICD-10-CM | POA: Diagnosis not present

## 2019-03-11 DIAGNOSIS — R911 Solitary pulmonary nodule: Secondary | ICD-10-CM | POA: Diagnosis not present

## 2019-03-11 MED ORDER — ROSUVASTATIN CALCIUM 20 MG PO TABS: 20.0000 mg | ORAL_TABLET | Freq: Every day | ORAL | 3 refills | Status: DC

## 2019-03-11 NOTE — Progress Notes (Addendum)
Acute Office Visit  Subjective:    Patient ID: Natasha Wilson, female    DOB: [birth year], 69 y.o.   MRN: [id number]  CC: My heart hurts and high blood pressure  HPI Natasha Wilson is a pleasant 69 year old female here today for intermittent "heart pain" that she has been experiencing for 15-20 years. She has had work-up for this in the past and was told it was related to anxiety. She was seen in the ED 02/11/19 for atypical chest pain and all work-up at that time was negative for PE or acute MI. The patient understood the ED physician to tell her that her CT scan revealed a cardiac issue and past cardiac damage. He also asked if she was on medication for her high blood pressure. This has caused the patient increased anxiety and concern for her health. She reports that she has had pinpoint specific pain in her heart several times since her December ED visit, but it occurred twice on Monday.   CHEST PAIN Time since onset: Duration:days Onset: sudden Quality: sharp, "like I am being stabbed with a needle into my heart"  Severity: moderate pain Location:left of the sternum- "directly over the heart" Radiation: none Episode duration: seconds Frequency: occasional a few times a month Related to exertion: no Activity when pain started: varies Trauma: no Anxiety/recent stressors: most often she can attribute the pain to anxiety Aggravating factors: stress/anxiety- the episodes on Monday she was at rest and calm Alleviating factors: nothing- goes away on its own in a few seconds Status: stable Treatments attempted:antacides Current pain status: pain free Shortness of breath: no Cough: no Nausea: no Diaphoresis: no Heartburn: yes Palpitations: no  HTN The patient is concerned for hypertension after the ED doctor mentioned this on her visit 02/11/19. She reports her blood pressures are generally in the 120's/70's, except on rare occasion. She does not take her blood pressure regularly at home at this  time.   HLD HYPERLIPIDEMIA Hyperlipidemia status: never diagnosed Past cholesterol meds: none Supplements: none Aspirin: no The 10-year ASCVD risk score Mikey Bussing DC Jr., et al., 2013) is: 7%   Values used to calculate the score:     Age: 25 years     Sex: Female     Is Non-Hispanic African American: No     Diabetic: No     Tobacco smoker: No     Systolic Blood Pressure: 123XX123 mmHg     Is BP treated: No     HDL Cholesterol: 63 mg/dL     Total Cholesterol: 231 mg/dL Chest pain:   yes Coronary artery disease:  coronary atherosclerosis on most recent CT Family history CAD:  yes Family history Jordon Kristiansen CAD:  unknown   Past Medical History:  Diagnosis Date  . Generalized anxiety disorder 05/24/2016  . GERD (gastroesophageal reflux disease)   . History of benign essential tremor 10/24/2015  . Thyroid activity decreased 11/10/2015    Past Surgical History:  Procedure Laterality Date  . ABDOMINAL HYSTERECTOMY    . ANTERIOR AND POSTERIOR REPAIR N/A 02/09/2019   Procedure: ANTERIOR (CYSTOCELE) AND POSTERIOR REPAIR (RECTOCELE);  Surgeon: Bjorn Loser, MD;  Location: [facility];  Service: Urology;  Laterality: N/A;  . bladder tack    . BREAST CYST EXCISION    . CHOLECYSTECTOMY    . CYSTOSCOPY N/A 02/09/2019   Procedure: CYSTOSCOPY;  Surgeon: Bjorn Loser, MD;  Location: [facility];  Service: Urology;  Laterality: N/A;    Family History  Problem Relation Age of  Onset  . Alcohol abuse Father   . Heart attack Father   . Cancer Sister        LUNG  . Depression Paternal Grandfather     Social History   Socioeconomic History  . Marital status: Single    Spouse name: Not on file  . Number of children: 2  . Years of education: 20  . Highest education level: Associate degree: academic program  Occupational History  . Occupation: retired    Comment: [employer]  Tobacco Use  . Smoking status: Former Smoker    Quit date: 1995    Years since quitting: 26.0  . Smokeless tobacco: Never  Used  Substance and Sexual Activity  . Alcohol use: Yes    Alcohol/week: 4.0 standard drinks    Types: 4 Glasses of wine per week    Comment: 4 ounces a night  . Drug use: Never  . Sexual activity: Not Currently  Other Topics Concern  . Not on file  Social History Narrative   Runs errands. Watch TV. Reads a lot and plays games on the computer.  Watches grandson some during the week as well. Had a bladder tac this year so will get back into walking this year.   Social Determinants of Health   Financial Resource Strain:   . Difficulty of Paying Living Expenses: Not on file  Food Insecurity:   . Worried About Charity fundraiser in the Last Year: Not on file  . Ran Out of Food in the Last Year: Not on file  Transportation Needs:   . Lack of Transportation (Medical): Not on file  . Lack of Transportation (Non-Medical): Not on file  Physical Activity:   . Days of Exercise per Week: Not on file  . Minutes of Exercise per Session: Not on file  Stress:   . Feeling of Stress : Not on file  Social Connections:   . Frequency of Communication with Friends and Family: Not on file  . Frequency of Social Gatherings with Friends and Family: Not on file  . Attends Religious Services: Not on file  . Active Member of Clubs or Organizations: Not on file  . Attends Archivist Meetings: Not on file  . Marital Status: Not on file  Intimate Partner Violence:   . Fear of Current or Ex-Partner: Not on file  . Emotionally Abused: Not on file  . Physically Abused: Not on file  . Sexually Abused: Not on file    Outpatient Medications Prior to Visit  Medication Sig Dispense Refill  . acetaminophen (TYLENOL) 500 MG tablet Take 1,000 mg by mouth every 6 (six) hours as needed for moderate pain.    . diphenhydrAMINE (BENADRYL) 25 MG tablet Take 25 mg by mouth daily as needed for allergies.    Marland Kitchen lansoprazole (PREVACID) 15 MG capsule Take 15 mg by mouth daily as needed (acid reflux).    Marland Kitchen  levothyroxine (SYNTHROID) 75 MCG tablet TAKE 1 TABLET BY MOUTH ONCE DAILY BEFORE BREAKFAST (Patient taking differently: Take 75 mcg by mouth daily before breakfast. ) 90 tablet 0  . Melatonin 10 MG CAPS Take 10-20 mg by mouth at bedtime as needed (sleep). Around 1900 and 2100     . HYDROcodone-acetaminophen (NORCO) 5-325 MG tablet Take 1-2 tablets by mouth every 6 (six) hours as needed for moderate pain. (Patient not taking: Reported on 02/22/2019) 30 tablet 0  . Prasterone (INTRAROSA) 6.5 MG INST Place 1 ampule vaginally at bedtime. (Patient not  taking: Reported on 01/29/2019) 30 each 12   No facility-administered medications prior to visit.    Allergies  Allergen Reactions  . Cefuroxime Nausea And Vomiting    Red splotches on face.    Review of Systems  Eyes: Negative for visual disturbance.  Respiratory: Negative for apnea, cough, chest tightness and shortness of breath.   Cardiovascular: Positive for chest pain. Negative for palpitations and leg swelling.  Gastrointestinal: Negative for abdominal distention and abdominal pain.  Musculoskeletal: Negative for back pain, myalgias, neck pain and neck stiffness.  Neurological: Negative for dizziness, weakness, light-headedness, numbness and headaches.  Psychiatric/Behavioral: The patient is nervous/anxious.        Objective:    Physical Exam Vitals and nursing note reviewed.  Constitutional:      Appearance: Normal appearance.  Eyes:     Pupils: Pupils are equal, round, and reactive to light.  Cardiovascular:     Rate and Rhythm: Normal rate and regular rhythm.     Chest Wall: PMI is not displaced.     Pulses: Normal pulses.     Heart sounds: Normal heart sounds. No murmur. No friction rub. No gallop.   Pulmonary:     Effort: Pulmonary effort is normal. No respiratory distress.     Breath sounds: Normal breath sounds. No decreased breath sounds, wheezing or rhonchi.  Chest:     Chest wall: No tenderness, crepitus or edema.   Abdominal:     General: There is no distension.     Palpations: Abdomen is soft.     Tenderness: There is no abdominal tenderness.  Musculoskeletal:        General: No tenderness.     Right lower leg: No edema.     Left lower leg: No edema.  Skin:    General: Skin is warm and dry.     Capillary Refill: Capillary refill takes less than 2 seconds.  Neurological:     Mental Status: She is alert and oriented to person, place, and time.  Psychiatric:        Mood and Affect: Mood normal.        Behavior: Behavior normal.     BP 121/67   Pulse 67   Temp 97.9 F (36.6 C) (Oral)   Ht 5\' 4"  (1.626 m)   Wt 161 lb 1.9 oz (73.1 kg)   SpO2 97%   BMI 27.66 kg/m  Wt Readings from Last 3 Encounters:  03/11/19 161 lb 1.9 oz (73.1 kg)  02/22/19 156 lb (70.8 kg)  02/11/19 160 lb (72.6 kg)    Health Maintenance Due  Topic Date Due  . PNA vac Low Risk Adult (1 of 2 - PCV13) 10/18/2015    There are no preventive care reminders to display for this patient.   Lab Results  Component Value Date   TSH 1.54 01/02/2018   Lab Results  Component Value Date   WBC 12.8 (H) 02/11/2019   HGB 11.2 (L) 02/11/2019   HCT 36.2 02/11/2019   MCV 84.2 02/11/2019   PLT 224 02/11/2019   Lab Results  Component Value Date   NA 130 (L) 02/11/2019   K 3.6 02/11/2019   CO2 27 02/11/2019   GLUCOSE 130 (H) 02/11/2019   BUN 10 02/11/2019   CREATININE 0.63 02/11/2019   BILITOT 0.5 01/02/2018   ALKPHOS 87 11/01/2016   AST 35 01/02/2018   ALT 33 (H) 01/02/2018   PROT 6.9 01/02/2018   ALBUMIN 4.3 11/01/2016   CALCIUM 8.9 02/11/2019  ANIONGAP 9 02/11/2019   Lab Results  Component Value Date   CHOL 231 (H) 11/01/2016   Lab Results  Component Value Date   HDL 63 11/01/2016   Lab Results  Component Value Date   LDLCALC 143 (H) 11/01/2016   Lab Results  Component Value Date   TRIG 126 11/01/2016   Lab Results  Component Value Date   CHOLHDL 3.7 11/01/2016   Lab Results  Component  Value Date   HGBA1C 5.5 12/20/2016       Assessment & Plan:   Problem List Items Addressed This Visit    None    Visit Diagnoses    Chest pain, unspecified type    -  Primary   Relevant Orders   Lipid panel   Elevated lipids       Relevant Medications   rosuvastatin (CRESTOR) 20 MG tablet   Other Relevant Orders   Lipid panel   Blood pressure check       Incidental lung nodule, > 45mm and < 34mm         1. Chest pain, unspecified type Educated the patient on chest pain that would warrant emergency treatment. Discussed the results of most recent CT in depth with the patient, including the atherosclerotic findings.  Advised the patient to keep a journal of food, stress, and activity with the intermittent chest pain.  Will collect lipids today, historically elevated. Will start statin.  - Lipid panel  2. Elevated lipids Historically elevated lipids. Will get new labs today. Given diagnosis of atherosclerosis and family history of cardiac disease, will start statin today. Information provided on dietary management of hyperlipidemia. Follow-up in about 3 months - rosuvastatin (CRESTOR) 20 MG tablet; Take 1 tablet (20 mg total) by mouth daily. Take 1/2 tablet by mouth at bedtime for the first 8 days, then begin taking a whole tablet by mouth every day at bedtime.  Dispense: 90 tablet; Refill: 3 - Lipid panel  3. Blood pressure check Blood pressure within normal range at visit today and historically does not appear to be elevated. Will recheck BP at next visit and advise patient to monitor at home, if possible.   4. Incidental lung nodule, >94mm and <37mm Discussed at length with patient the incidental finding of lung nodule on most recent CT in ED (02/11/2019). Explained to the patient that the recommendations would be to follow-up with another CT in 6-12 months to monitor for growth. Pt in agreement to this plan. Reminder set to remind patient in June 2021 to schedule CT.  Meds ordered  this encounter  Medications  . rosuvastatin (CRESTOR) 20 MG tablet    Sig: Take 1 tablet (20 mg total) by mouth daily. Take 1/2 tablet by mouth at bedtime for the first 8 days, then begin taking a whole tablet by mouth every day at bedtime.    Dispense:  90 tablet    Refill:  3    Order Specific Question:   Supervising Provider    Answer:   Ricard Dillon [id number]   Return in about 3 months (around 06/09/2019), or HLD.   Orma Render, NP

## 2019-03-11 NOTE — Patient Instructions (Addendum)
High Cholesterol  High cholesterol is a condition in which the blood has high levels of a white, waxy, fat-like substance (cholesterol). The human body needs small amounts of cholesterol. The liver makes all the cholesterol that the body needs. Extra (excess) cholesterol comes from the food that we eat. Cholesterol is carried from the liver by the blood through the blood vessels. If you have high cholesterol, deposits (plaques) may build up on the walls of your blood vessels (arteries). Plaques make the arteries narrower and stiffer. Cholesterol plaques increase your risk for heart attack and stroke. Work with your health care provider to keep your cholesterol levels in a healthy range. What increases the risk? This condition is more likely to develop in people who:  Eat foods that are high in animal fat (saturated fat) or cholesterol.  Are overweight.  Are not getting enough exercise.  Have a family history of high cholesterol. What are the signs or symptoms? There are no symptoms of this condition. How is this diagnosed? This condition may be diagnosed from the results of a blood test.  If you are older than age 20, your health care provider may check your cholesterol every 4-6 years.  You may be checked more often if you already have high cholesterol or other risk factors for heart disease. The blood test for cholesterol measures:  "Bad" cholesterol (LDL cholesterol). This is the main type of cholesterol that causes heart disease. The desired level for LDL is less than 100.  "Good" cholesterol (HDL cholesterol). This type helps to protect against heart disease by cleaning the arteries and carrying the LDL away. The desired level for HDL is 60 or higher.  Triglycerides. These are fats that the body can store or burn for energy. The desired number for triglycerides is lower than 150.  Total cholesterol. This is a measure of the total amount of cholesterol in your blood, including LDL  cholesterol, HDL cholesterol, and triglycerides. A healthy number is less than 200. How is this treated? This condition is treated with diet changes, lifestyle changes, and medicines. Diet changes  This may include eating more whole grains, fruits, vegetables, nuts, and fish.  This may also include cutting back on red meat and foods that have a lot of added sugar. Lifestyle changes  Changes may include getting at least 40 minutes of aerobic exercise 3 times a week. Aerobic exercises include walking, biking, and swimming. Aerobic exercise along with a healthy diet can help you maintain a healthy weight.  Changes may also include quitting smoking. Medicines  Medicines are usually given if diet and lifestyle changes have failed to reduce your cholesterol to healthy levels.  Your health care provider may prescribe a statin medicine. Statin medicines have been shown to reduce cholesterol, which can reduce the risk of heart disease. Follow these instructions at home: Eating and drinking If told by your health care provider:  Eat chicken (without skin), fish, veal, shellfish, ground turkey breast, and round or loin cuts of red meat.  Do not eat fried foods or fatty meats, such as hot dogs and salami.  Eat plenty of fruits, such as apples.  Eat plenty of vegetables, such as broccoli, potatoes, and carrots.  Eat beans, peas, and lentils.  Eat grains such as barley, rice, couscous, and bulgur wheat.  Eat pasta without cream sauces.  Use skim or nonfat milk, and eat low-fat or nonfat yogurt and cheeses.  Do not eat or drink whole milk, cream, ice cream, egg yolks,   or hard cheeses.  Do not eat stick margarine or tub margarines that contain trans fats (also called partially hydrogenated oils).  Do not eat saturated tropical oils, such as coconut oil and palm oil.  Do not eat cakes, cookies, crackers, or other baked goods that contain trans fats.  General instructions  Exercise as  directed by your health care provider. Increase your activity level with activities such as gardening, walking, and taking the stairs.  Take over-the-counter and prescription medicines only as told by your health care provider.  Do not use any products that contain nicotine or tobacco, such as cigarettes and e-cigarettes. If you need help quitting, ask your health care provider.  Keep all follow-up visits as told by your health care provider. This is important. Contact a health care provider if:  You are struggling to maintain a healthy diet or weight.  You need help to start on an exercise program.  You need help to stop smoking. Get help right away if:  You have chest pain.  You have trouble breathing. This information is not intended to replace advice given to you by your health care provider. Make sure you discuss any questions you have with your health care provider. Document Revised: 02/21/2017 Document Reviewed: 08/19/2015 Elsevier Patient Education  [place]. Cholesterol Content in Foods Cholesterol is a waxy, fat-like substance that helps to carry fat in the blood. The body needs cholesterol in small amounts, but too much cholesterol can cause damage to the arteries and heart. Most people should eat less than 200 milligrams (mg) of cholesterol a day. Foods with cholesterol  Cholesterol is found in animal-based foods, such as meat, seafood, and dairy. Generally, low-fat dairy and lean meats have less cholesterol than full-fat dairy and fatty meats. The milligrams of cholesterol per serving (mg per serving) of common cholesterol-containing foods are listed below. Meat and other proteins  Egg -- one large whole egg has 186 mg.  Veal shank -- 4 oz has 141 mg.  Lean ground [place] (93% lean) -- 4 oz has 118 mg.  Fat-trimmed lamb loin -- 4 oz has 106 mg.  Lean ground beef (90% lean) -- 4 oz has 100 mg.  Lobster -- 3.5 oz has 90 mg.  Pork loin chops -- 4 oz has 86  mg.  Canned salmon -- 3.5 oz has 83 mg.  Fat-trimmed beef top loin -- 4 oz has 78 mg.  Frankfurter -- 1 frank (3.5 oz) has 77 mg.  Crab -- 3.5 oz has 71 mg.  Roasted chicken without skin, white meat -- 4 oz has 66 mg.  Light bologna -- 2 oz has 45 mg.  Deli-cut [place] -- 2 oz has 31 mg.  Canned tuna -- 3.5 oz has 31 mg.  Berniece Salines -- 1 oz has 29 mg.  Oysters and mussels (raw) -- 3.5 oz has 25 mg.  Mackerel -- 1 oz has 22 mg.  Trout -- 1 oz has 20 mg.  Pork sausage -- 1 link (1 oz) has 17 mg.  Salmon -- 1 oz has 16 mg.  Tilapia -- 1 oz has 14 mg. Dairy  Soft-serve ice cream --  cup (4 oz) has 103 mg.  Whole-milk yogurt -- 1 cup (8 oz) has 29 mg.  Cheddar cheese -- 1 oz has 28 mg.  American cheese -- 1 oz has 28 mg.  Whole milk -- 1 cup (8 oz) has 23 mg.  2% milk -- 1 cup (8 oz) has 18 mg.  Cream cheese -- 1 tablespoon (Tbsp) has 15 mg.  Cottage cheese --  cup (4 oz) has 14 mg.  Low-fat (1%) milk -- 1 cup (8 oz) has 10 mg.  Sour cream -- 1 Tbsp has 8.5 mg.  Low-fat yogurt -- 1 cup (8 oz) has 8 mg.  Nonfat Greek yogurt -- 1 cup (8 oz) has 7 mg.  Half-and-half cream -- 1 Tbsp has 5 mg. Fats and oils  Cod liver oil -- 1 tablespoon (Tbsp) has 82 mg.  Butter -- 1 Tbsp has 15 mg.  Lard -- 1 Tbsp has 14 mg.  Bacon grease -- 1 Tbsp has 14 mg.  Mayonnaise -- 1 Tbsp has 5-10 mg.  Margarine -- 1 Tbsp has 3-10 mg. Exact amounts of cholesterol in these foods may vary depending on specific ingredients and brands. Foods without cholesterol Most plant-based foods do not have cholesterol unless you combine them with a food that has cholesterol. Foods without cholesterol include:  Grains and cereals.  Vegetables.  Fruits.  Vegetable oils, such as olive, canola, and sunflower oil.  Legumes, such as peas, beans, and lentils.  Nuts and seeds.  Egg whites. Summary  The body needs cholesterol in small amounts, but too much cholesterol can cause damage to  the arteries and heart.  Most people should eat less than 200 milligrams (mg) of cholesterol a day. This information is not intended to replace advice given to you by your health care provider. Make sure you discuss any questions you have with your health care provider. Document Revised: 01/31/2017 Document Reviewed: 10/15/2016 Elsevier Patient Education  [place].   Nonspecific Chest Pain Chest pain can be caused by many different conditions. Some causes of chest pain can be life-threatening. These will require treatment right away. Serious causes of chest pain include:  Heart attack.  A tear in the body's main blood vessel.  Redness and swelling (inflammation) around your heart.  Blood clot in your lungs. Other causes of chest pain may not be so serious. These include:  Heartburn.  Anxiety or stress.  Damage to bones or muscles in your chest.  Lung infections. Chest pain can feel like:  Pain or discomfort in your chest.  Crushing, pressure, aching, or squeezing pain.  Burning or tingling.  Dull or sharp pain that is worse when you move, cough, or take a deep breath.  Pain or discomfort that is also felt in your back, neck, jaw, shoulder, or arm, or pain that spreads to any of these areas. It is hard to know whether your pain is caused by something that is serious or something that is not so serious. So it is important to see your doctor right away if you have chest pain. Follow these instructions at home: Medicines  Take over-the-counter and prescription medicines only as told by your doctor.  If you were prescribed an antibiotic medicine, take it as told by your doctor. Do not stop taking the antibiotic even if you start to feel better. Lifestyle   Rest as told by your doctor.  Do not use any products that contain nicotine or tobacco, such as cigarettes, e-cigarettes, and chewing tobacco. If you need help quitting, ask your doctor.  Do not drink  alcohol.  Make lifestyle changes as told by your doctor. These may include: ? Getting regular exercise. Ask your doctor what activities are safe for you. ? Eating a heart-healthy diet. A diet and nutrition specialist (dietitian) can help you to learn healthy eating  options. ? Staying at a healthy weight. ? Treating diabetes or high blood pressure, if needed. ? Lowering your stress. Activities such as yoga and relaxation techniques can help. General instructions  Pay attention to any changes in your symptoms. Tell your doctor about them or any new symptoms.  Avoid any activities that cause chest pain.  Keep all follow-up visits as told by your doctor. This is important. You may need more testing if your chest pain does not go away. Contact a doctor if:  Your chest pain does not go away.  You feel depressed.  You have a fever. Get help right away if:  Your chest pain is worse.  You have a cough that gets worse, or you cough up blood.  You have very bad (severe) pain in your belly (abdomen).  You pass out (faint).  You have either of these for no clear reason: ? Sudden chest discomfort. ? Sudden discomfort in your arms, back, neck, or jaw.  You have shortness of breath at any time.  You suddenly start to sweat, or your skin gets clammy.  You feel sick to your stomach (nauseous).  You throw up (vomit).  You suddenly feel lightheaded or dizzy.  You feel very weak or tired.  Your heart starts to beat fast, or it feels like it is skipping beats. These symptoms may be an emergency. Do not wait to see if the symptoms will go away. Get medical help right away. Call your local emergency services (911 in the [place] Do not drive yourself to the hospital. Summary  Chest pain can be caused by many different conditions. The cause may be serious and need treatment right away. If you have chest pain, see your doctor right away.  Follow your doctor's instructions for taking  medicines and making lifestyle changes.  Keep all follow-up visits as told by your doctor. This includes visits for any further testing if your chest pain does not go away.  Be sure to know the signs that show that your condition has become worse. Get help right away if you have these symptoms. This information is not intended to replace advice given to you by your health care provider. Make sure you discuss any questions you have with your health care provider. Document Revised: 08/21/2017 Document Reviewed: 08/21/2017 Elsevier Patient Education  [place].   Atherosclerosis  Atherosclerosis is narrowing and hardening of the arteries. Arteries are blood vessels that carry blood from the heart to all parts of the body. This blood contains oxygen. Arteries can become narrow or clogged with a buildup of fat, cholesterol, calcium, and other substances (plaque). Plaque decreases the amount of blood that can flow through the artery. Atherosclerosis can affect any artery in the body, including:  Heart arteries (coronary artery disease). This may cause a heart attack.  Brain arteries. This may cause a stroke (cerebrovascular accident).  Leg, arm, and pelvis arteries (peripheral artery disease). This may cause pain and numbness.  Kidney arteries. This may cause kidney (renal) failure. Treatment may slow the disease and prevent further damage to the heart, brain, peripheral arteries, and kidneys. What are the causes? Atherosclerosis develops slowly over many years. The inner layers of your arteries become damaged and allow the gradual buildup of plaque. The exact cause of atherosclerosis is not fully understood. Symptoms of atherosclerosis do not occur until the artery becomes narrow or blocked. What increases the risk? The following factors may make you more likely to develop this condition:  High blood pressure.  High cholesterol.  Being middle-aged or older.  Having a family  history of atherosclerosis.  Having high blood fats (triglycerides).  Diabetes.  Being overweight.  Smoking tobacco.  Not exercising enough (sedentary lifestyle).  Having a substance in the blood called C-reactive protein (CRP). This is a sign of increased levels of inflammation in the body.  Sleep apnea.  Being stressed.  Drinking too much alcohol. What are the signs or symptoms? This condition may not cause any symptoms. If you have symptoms, they are caused by damage to an area of your body that is not getting enough blood.  Coronary artery disease may cause chest pain and shortness of breath.  Decreased blood supply to your brain may cause a stroke. Signs of a stroke may include sudden: ? Weakness on one side of the body. ? Confusion. ? Changes in vision. ? Inability to speak or understand speech. ? Loss of balance, coordination, or the ability to walk. ? Severe headache. ? Loss of consciousness.  Peripheral arterial disease may cause pain and numbness, often in the legs and hips.  Renal failure may cause fatigue, nausea, swelling, and itchy skin. How is this diagnosed? This condition is diagnosed based on your medical history and a physical exam. During the exam:  Your health care provider will: ? Check your pulse in different places. ? Listen for a "whooshing" sound over your arteries (bruit).  You may have tests, such as: ? Blood tests to check your levels of cholesterol, triglycerides, and CRP. ? Electrocardiogram (ECG) to check for heart damage. ? Chest X-ray to see if you have an enlarged heart, which is a sign of heart failure. ? Stress test to see how your heart reacts to exercise. ? Echocardiogram to get images of the inside of your heart. ? Ankle-brachial index to compare blood pressure in your arms to blood pressure in your ankles. ? Ultrasound of your peripheral arteries to check blood flow. ? CT scan to check for damage to your heart or  brain. ? X-rays of blood vessels after dye has been injected (angiogram) to check blood flow. How is this treated? Treatment starts with lifestyle changes, which may include:  Changing your diet.  Losing weight.  Reducing stress.  Exercising and being physically active more regularly.  Not smoking. You may also need medicine to:  Lower triglycerides and cholesterol.  Control blood pressure.  Prevent blood clots.  Lower inflammation in your body.  Control your blood sugar. Sometimes, surgery is needed to:  Remove plaque from an artery (endarterectomy).  Open or widen a narrowed heart artery (angioplasty).  Create a new path for your blood with one of these procedures: ? Heart (coronary) artery bypass graft surgery. ? Peripheral artery bypass graft surgery. Follow these instructions at home: Eating and drinking   Eat a heart-healthy diet. Talk with your health care provider or a diet and nutrition specialist (dietitian) if you need help. A heart-healthy diet involves: ? Limiting unhealthy fats and increasing healthy fats. Some examples of healthy fats are olive oil and canola oil. ? Eating plant-based foods, such as fruits, vegetables, nuts, whole grains, and legumes (such as peas and lentils).  Limit alcohol intake to no more than 1 drink a day for nonpregnant women and 2 drinks a day for men. One drink equals 12 oz of beer, 5 oz of wine, or 1 oz of hard liquor. Lifestyle  Follow an exercise program as told by your health care provider.  Maintain a healthy weight. Lose weight if your health care provider says that you need to do that.  Rest when you are tired.  Learn to manage your stress.  Do not use any products that contain nicotine or tobacco, such as cigarettes and e-cigarettes. If you need help quitting, ask your health care provider.  Do not abuse drugs. General instructions  Take over-the-counter and prescription medicines only as told by your health  care provider.  Manage other health conditions as told by your health care provider.  Keep all follow-up visits as told by your health care provider. This is important. Contact a health care provider if:  You have chest pain or discomfort. This includes squeezing chest pain that may feel like indigestion (angina).  You have shortness of breath.  You have an irregular heartbeat.  You have unexplained fatigue.  You have unexplained pain or numbness in an arm, leg, or hip.  You have nausea, swelling of your hands or feet, and itchy skin. Get help right away if:  You have any symptoms of a heart attack, such as: ? Chest pain. ? Shortness of breath. ? Pain in your neck, jaw, arms, back, or stomach. ? Cold sweat. ? Nausea. ? Light-headedness.  You have any symptoms of a stroke. "BE FAST" is an easy way to remember the main warning signs of a stroke: ? B - Balance. Signs are dizziness, sudden trouble walking, or loss of balance. ? E - Eyes. Signs are trouble seeing or a sudden change in vision. ? F - Face. Signs are sudden weakness or numbness of the face, or the face or eyelid drooping on one side. ? A - Arms. Signs are weakness or numbness in an arm. This happens suddenly and usually on one side of the body. ? S - Speech. Signs are sudden trouble speaking, slurred speech, or trouble understanding what people say. ? T - Time. Time to call emergency services. Write down what time symptoms started.  You have other signs of a stroke, such as: ? A sudden, severe headache with no known cause. ? Nausea or vomiting. ? Seizure. These symptoms may represent a serious problem that is an emergency. Do not wait to see if the symptoms will go away. Get medical help right away. Call your local emergency services (911 in the [place] Do not drive yourself to the hospital. Summary  Atherosclerosis is narrowing and hardening of the arteries.  Arteries can become narrow or clogged with a buildup  of fat, cholesterol, calcium, and other substances (plaque).  This condition may not cause any symptoms. If you do have symptoms, they are caused by damage to an area of your body that is not getting enough blood.  Treatment may include lifestyle changes and medicines. In some cases, surgery is needed. This information is not intended to replace advice given to you by your health care provider. Make sure you discuss any questions you have with your health care provider. Document Revised: 05/30/2017 Document Reviewed: 10/24/2016 Elsevier Patient Education  [place].

## 2019-03-15 DIAGNOSIS — R079 Chest pain, unspecified: Secondary | ICD-10-CM | POA: Diagnosis not present

## 2019-03-15 DIAGNOSIS — E785 Hyperlipidemia, unspecified: Secondary | ICD-10-CM | POA: Diagnosis not present

## 2019-03-16 LAB — LIPID PANEL
Cholesterol: 186 mg/dL (ref <200)
HDL: 54 mg/dL (ref >=50)
LDL Cholesterol (Calc): 110 mg/dL (calc) — ABNORMAL HIGH
Non-HDL Cholesterol (Calc): 132 mg/dL (calc) — ABNORMAL HIGH (ref <130)
Total CHOL/HDL Ratio: 3.4 (calc) (ref <5.0)
Triglycerides: 116 mg/dL (ref <150)

## 2019-03-17 ENCOUNTER — Other Ambulatory Visit: Payer: Self-pay | Admitting: Osteopathic Medicine

## 2019-03-17 NOTE — Telephone Encounter (Signed)
[facility] requesting med refill for levothyroxine. Last thyroid check was 01/02/2018, results were within normal range. Pls advise if refill is appropriate, thanks.

## 2019-03-17 NOTE — Telephone Encounter (Signed)
Requested medication (s) are due for refill today: yes  Requested medication (s) are on the active medication list: yes  Last refill:  12/14/2018  Future visit scheduled: yes  Notes to clinic:  review for refill   Requested Prescriptions  Pending Prescriptions Disp Refills   levothyroxine (SYNTHROID) 75 MCG tablet [Pharmacy Med Name: Levothyroxine Sodium 75 MCG Oral Tablet] 90 tablet 0    Sig: TAKE 1 TABLET BY MOUTH ONCE DAILY BEFORE BREAKFAST      Endocrinology:  Hypothyroid Agents Failed - 03/17/2019  7:48 AM      Failed - TSH needs to be rechecked within 3 months after an abnormal result. Refill until TSH is due.      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

## 2019-03-19 DIAGNOSIS — R3914 Feeling of incomplete bladder emptying: Secondary | ICD-10-CM | POA: Diagnosis not present

## 2019-04-15 ENCOUNTER — Encounter: Payer: Self-pay | Admitting: Nurse Practitioner

## 2019-05-17 ENCOUNTER — Encounter: Payer: Self-pay | Admitting: Osteopathic Medicine

## 2019-05-17 ENCOUNTER — Other Ambulatory Visit: Payer: Self-pay

## 2019-05-17 ENCOUNTER — Ambulatory Visit (INDEPENDENT_AMBULATORY_CARE_PROVIDER_SITE_OTHER): Payer: Medicare HMO | Admitting: Osteopathic Medicine

## 2019-05-17 VITALS — BP 128/77 | HR 67 | Temp 98.0°F | Wt 157.1 lb

## 2019-05-17 DIAGNOSIS — E782 Mixed hyperlipidemia: Secondary | ICD-10-CM

## 2019-05-17 DIAGNOSIS — R399 Unspecified symptoms and signs involving the genitourinary system: Secondary | ICD-10-CM | POA: Diagnosis not present

## 2019-05-17 DIAGNOSIS — N3090 Cystitis, unspecified without hematuria: Secondary | ICD-10-CM | POA: Diagnosis not present

## 2019-05-17 LAB — POCT URINALYSIS DIP (CLINITEK)
Bilirubin, UA: NEGATIVE
Glucose, UA: NEGATIVE mg/dL
Ketones, POC UA: NEGATIVE mg/dL
Nitrite, UA: NEGATIVE
POC PROTEIN,UA: NEGATIVE
Spec Grav, UA: 1.02 (ref 1.010–1.025)
Urobilinogen, UA: 0.2 E.U./dL
pH, UA: 6.5 (ref 5.0–8.0)

## 2019-05-17 MED ORDER — PHENAZOPYRIDINE HCL 100 MG PO TABS: 100.0000 mg | ORAL_TABLET | Freq: Three times a day (TID) | ORAL | 0 refills | Status: DC | PRN

## 2019-05-17 MED ORDER — NITROFURANTOIN MONOHYD MACRO 100 MG PO CAPS: 100.0000 mg | ORAL_CAPSULE | Freq: Two times a day (BID) | ORAL | 0 refills | Status: DC

## 2019-05-17 NOTE — Progress Notes (Signed)
Natasha Wilson is a 69 y.o. female who presents to  [facility] at [facility]  today, 05/17/19, seeking care for the following: . UTI     ASSESSMENT & PLAN with other pertinent history/findings:  The primary encounter diagnosis was Cystitis. Diagnoses of UTI symptoms and Mixed hyperlipidemia were also pertinent to this visit.   . Concern for UTI - dysuria and suprapubic pain x5 days, no fever or abd pain/nausea. Exam: no CVA tenderness. UA results as below (+) Blood, Leuks . Pt denies CP/SOB on exertion. She saw NP ont too long ago for panic issues. Reports attributes intermittent sharp CP to panic/anxiety. Would consider stress test if needed. OK to cancel upcoming visit, will just get labs, ER precautions reviewed   Orders Placed This Encounter  Procedures  . Urine Culture  . Urinalysis, microscopic only  . Lipid panel  . COMPLETE METABOLIC PANEL WITH GFR  . POCT URINALYSIS DIP (CLINITEK)    Meds ordered this encounter  Medications  . nitrofurantoin, macrocrystal-monohydrate, (MACROBID) 100 MG capsule    Sig: Take 1 capsule (100 mg total) by mouth 2 (two) times daily.    Dispense:  10 capsule    Refill:  0  . phenazopyridine (PYRIDIUM) 100 MG tablet    Sig: Take 1 tablet (100 mg total) by mouth 3 (three) times daily as needed for pain.    Dispense:  15 tablet    Refill:  0       Follow-up instructions: Return if symptoms worsen or fail to improve.                                         BP 128/77 (BP Location: Left Arm, Patient Position: Sitting, Cuff Size: Normal)   Pulse 67   Temp 98 F (36.7 C) (Oral)   Wt 157 lb 1.9 oz (71.3 kg)   BMI 26.97 kg/m   Current Meds  Medication Sig  . acetaminophen (TYLENOL) 500 MG tablet Take 1,000 mg by mouth every 6 (six) hours as needed for moderate pain.  . diphenhydrAMINE (BENADRYL) 25 MG tablet Take 25 mg by mouth daily as needed for allergies.   Marland Kitchen lansoprazole (PREVACID) 15 MG capsule Take 15 mg by mouth daily as needed (acid reflux).  Marland Kitchen levothyroxine (SYNTHROID) 75 MCG tablet Take 1 tablet (75 mcg total) by mouth daily before breakfast.  . Melatonin 10 MG CAPS Take 10-20 mg by mouth at bedtime as needed (sleep). Around 1900 and 2100   . rosuvastatin (CRESTOR) 20 MG tablet Take 1 tablet (20 mg total) by mouth daily. Take 1/2 tablet by mouth at bedtime for the first 8 days, then begin taking a whole tablet by mouth every day at bedtime.    Results for orders placed or performed in visit on 05/17/19 (from the past 72 hour(s))  POCT URINALYSIS DIP (CLINITEK)     Status: Abnormal   Collection Time: 05/17/19  1:19 PM  Result Value Ref Range   Color, UA light yellow (A) yellow   Clarity, UA turbid (A) clear   Glucose, UA negative negative mg/dL   Bilirubin, UA negative negative   Ketones, POC UA negative negative mg/dL   Spec Grav, UA 1.020 1.010 - 1.025   Blood, UA moderate (A) negative   pH, UA 6.5 5.0 - 8.0   POC PROTEIN,UA negative negative, trace  Urobilinogen, UA 0.2 0.2 or 1.0 E.U./dL   Nitrite, UA Negative Negative   Leukocytes, UA Moderate (2+) (A) Negative    No results found.     All questions at time of visit were answered - patient instructed to contact office with any additional concerns or updates.  ER/RTC precautions were reviewed with the patient.  Please note: voice recognition software was used to produce this document, and typos may escape review. Please contact Dr. Sheppard Coil for any needed clarifications.

## 2019-05-17 NOTE — Patient Instructions (Signed)

## 2019-05-18 DIAGNOSIS — N3946 Mixed incontinence: Secondary | ICD-10-CM | POA: Diagnosis not present

## 2019-05-18 DIAGNOSIS — N8111 Cystocele, midline: Secondary | ICD-10-CM | POA: Diagnosis not present

## 2019-05-19 LAB — URINALYSIS, MICROSCOPIC ONLY
Hyaline Cast: NONE SEEN /LPF
Squamous Epithelial / HPF: NONE SEEN /HPF (ref <=5)
WBC, UA: >=60 /HPF — AB (ref 0–5)

## 2019-05-19 LAB — URINE CULTURE
MICRO NUMBER:: 10252253
SPECIMEN QUALITY:: ADEQUATE

## 2019-06-09 ENCOUNTER — Ambulatory Visit: Payer: Medicare HMO | Admitting: Osteopathic Medicine

## 2019-06-11 DIAGNOSIS — E782 Mixed hyperlipidemia: Secondary | ICD-10-CM | POA: Diagnosis not present

## 2019-06-12 LAB — COMPLETE METABOLIC PANEL WITH GFR
AG Ratio: 1.6 (calc) (ref 1.0–2.5)
ALT: 30 U/L — ABNORMAL HIGH (ref 6–29)
AST: 41 U/L — ABNORMAL HIGH (ref 10–35)
Albumin: 4.2 g/dL (ref 3.6–5.1)
Alkaline phosphatase (APISO): 82 U/L (ref 37–153)
BUN: 9 mg/dL (ref 7–25)
CO2: 25 mmol/L (ref 20–32)
Calcium: 9.4 mg/dL (ref 8.6–10.4)
Chloride: 106 mmol/L (ref 98–110)
Creat: 0.65 mg/dL (ref 0.50–0.99)
GFR, Est African American: 106 mL/min/{1.73_m2} (ref >=60)
GFR, Est Non African American: 91 mL/min/{1.73_m2} (ref >=60)
Globulin: 2.7 g/dL (calc) (ref 1.9–3.7)
Glucose, Bld: 102 mg/dL (ref 65–139)
Potassium: 3.6 mmol/L (ref 3.5–5.3)
Sodium: 141 mmol/L (ref 135–146)
Total Bilirubin: 0.6 mg/dL (ref 0.2–1.2)
Total Protein: 6.9 g/dL (ref 6.1–8.1)

## 2019-06-12 LAB — LIPID PANEL
Cholesterol: 133 mg/dL (ref <200)
HDL: 49 mg/dL — ABNORMAL LOW (ref >=50)
LDL Cholesterol (Calc): 66 mg/dL (calc)
Non-HDL Cholesterol (Calc): 84 mg/dL (calc) (ref <130)
Total CHOL/HDL Ratio: 2.7 (calc) (ref <5.0)
Triglycerides: 95 mg/dL (ref <150)

## 2019-06-15 ENCOUNTER — Other Ambulatory Visit: Payer: Self-pay | Admitting: Osteopathic Medicine

## 2019-06-18 ENCOUNTER — Encounter: Payer: Self-pay | Admitting: Osteopathic Medicine

## 2019-06-18 ENCOUNTER — Other Ambulatory Visit: Payer: Self-pay

## 2019-06-18 ENCOUNTER — Telehealth: Payer: Self-pay

## 2019-06-18 DIAGNOSIS — E039 Hypothyroidism, unspecified: Secondary | ICD-10-CM

## 2019-06-18 MED ORDER — LEVOTHYROXINE SODIUM 75 MCG PO TABS: 75.0000 ug | ORAL_TABLET | Freq: Every day | ORAL | 0 refills | Status: DC

## 2019-06-18 NOTE — Telephone Encounter (Signed)
Spoke directly to the patient concerning levothyroxine refill.  Sent to pharmacy.  Labs have been ordered.

## 2019-06-20 ENCOUNTER — Ambulatory Visit (HOSPITAL_COMMUNITY)
Admission: EM | Admit: 2019-06-20 | Discharge: 2019-06-20 | Disposition: A | Discharge status: Home / Self Care | Payer: Medicare HMO | Attending: Family Medicine | Admitting: Family Medicine

## 2019-06-20 ENCOUNTER — Encounter (HOSPITAL_COMMUNITY): Payer: Self-pay

## 2019-06-20 ENCOUNTER — Other Ambulatory Visit: Payer: Self-pay

## 2019-06-20 DIAGNOSIS — R0789 Other chest pain: Secondary | ICD-10-CM | POA: Diagnosis not present

## 2019-06-20 NOTE — ED Provider Notes (Addendum)
[place]    CSN: [id number] Arrival date & time: 06/20/19  1646      History   Chief Complaint Chief Complaint  Patient presents with  . Chest Pain    HPI Natasha Wilson is a 69 y.o. female.   Initial [facility] patient visit.  69 yo woman with left upper chest pain radiating into upper left arm.  Patient reports she was visiting a friend in the hospital when she noticed a dull pain in her chest which lasted an hour. States this is not like when she had a panic attack, which typically presents as sharp chest pain. After the visit, she felt an "ice pick" like chest pain which subsided in the waiting room here.   Reports that she did take one of her PRN hydroxyzine for the latter pain.  Risk factors:  Dad died at 24 but was an alcoholic.  Patient is taking medicine for cholesterol.  Nonsmoker, no diabetes or hypertension.  Other negatives:  Shortness of breath, diaphoresis, nausea, positional variation of pain   Note;  Patient was visiting dear friend who is not doing well with brain cancer and incomplete extirpation of the lesion.  Past Medical History:  Diagnosis Date  . Generalized anxiety disorder 05/24/2016  . GERD (gastroesophageal reflux disease)   . History of benign essential tremor 10/24/2015  . Thyroid activity decreased 11/10/2015    Patient Active Problem List   Diagnosis Date Noted  . Cystocele with prolapse 02/09/2019  . Postmenopausal atrophic vaginitis 08/11/2018  . Prolapse of anterior vaginal wall 08/11/2018  . Posterior vaginal wall prolapse 08/11/2018  . Onychomycosis 11/01/2016  . History of chronic eczema 11/01/2016  . Generalized anxiety disorder 05/24/2016  . S/P colonoscopy 12/20/2015  . Thyroid activity decreased 11/10/2015  . Relationship problem between parent and child 10/24/2015  . Annual physical exam 10/24/2015  . History of benign essential tremor 10/24/2015  . History of hysterectomy for benign disease 10/24/2015    Past  Surgical History:  Procedure Laterality Date  . ABDOMINAL HYSTERECTOMY    . ANTERIOR AND POSTERIOR REPAIR N/A 02/09/2019   Procedure: ANTERIOR (CYSTOCELE) AND POSTERIOR REPAIR (RECTOCELE);  Surgeon: Bjorn Loser, MD;  Location: [facility];  Service: Urology;  Laterality: N/A;  . bladder tack    . BREAST CYST EXCISION    . CHOLECYSTECTOMY    . CYSTOSCOPY N/A 02/09/2019   Procedure: CYSTOSCOPY;  Surgeon: Bjorn Loser, MD;  Location: [facility];  Service: Urology;  Laterality: N/A;    OB History    Gravida  2   Para  2   Term  2   Preterm      AB      Living  2     SAB      TAB      Ectopic      Multiple      Live Births               Home Medications    Prior to Admission medications   Medication Sig Start Date End Date Taking? Authorizing Provider  acetaminophen (TYLENOL) 500 MG tablet Take 1,000 mg by mouth every 6 (six) hours as needed for moderate pain.    [provider]  diphenhydrAMINE (BENADRYL) 25 MG tablet Take 25 mg by mouth daily as needed for allergies.    [provider]  HYDROcodone-acetaminophen (NORCO) 5-325 MG tablet Take 1-2 tablets by mouth every 6 (six) hours as needed for moderate pain. Patient not  taking: Reported on 05/17/2019 02/09/19   Debbrah Alar, PA-C  lansoprazole (PREVACID) 15 MG capsule Take 15 mg by mouth daily as needed (acid reflux).    [provider]  levothyroxine (SYNTHROID) 75 MCG tablet Take 1 tablet (75 mcg total) by mouth daily before breakfast. 06/18/19   Emeterio Reeve, DO  Melatonin 10 MG CAPS Take 10-20 mg by mouth at bedtime as needed (sleep). Around 1900 and 2100     [provider]  nitrofurantoin, macrocrystal-monohydrate, (MACROBID) 100 MG capsule Take 1 capsule (100 mg total) by mouth 2 (two) times daily. 05/17/19   Emeterio Reeve, DO  phenazopyridine (PYRIDIUM) 100 MG tablet Take 1 tablet (100 mg total) by mouth 3 (three) times daily as needed for pain. 05/17/19    Emeterio Reeve, DO  Prasterone (INTRAROSA) 6.5 MG INST Place 1 ampule vaginally at bedtime. Patient not taking: Reported on 05/17/2019 10/01/18   Emily Filbert, MD  rosuvastatin (CRESTOR) 20 MG tablet Take 1 tablet (20 mg total) by mouth daily. Take 1/2 tablet by mouth at bedtime for the first 8 days, then begin taking a whole tablet by mouth every day at bedtime. 03/11/19   Orma Render, NP    Family History Family History  Problem Relation Age of Onset  . Alcohol abuse Father   . Heart attack Father   . Cancer Sister        LUNG  . Depression Paternal Grandfather     Social History Social History   Tobacco Use  . Smoking status: Former Smoker    Quit date: 1995    Years since quitting: 26.3  . Smokeless tobacco: Never Used  Substance Use Topics  . Alcohol use: Yes    Alcohol/week: 4.0 standard drinks    Types: 4 Glasses of wine per week    Comment: 4 ounces a night  . Drug use: Never     Allergies   Cefuroxime   Review of Systems Review of Systems  Cardiovascular: Positive for chest pain.  All other systems reviewed and are negative.    Physical Exam Triage Vital Signs ED Triage Vitals  Enc Vitals Group     BP 06/20/19 1651 (!) 173/58     Pulse Rate 06/20/19 1651 65     Resp 06/20/19 1651 (!) 22     Temp 06/20/19 1651 98.5 F (36.9 C)     Temp Source 06/20/19 1651 Oral     SpO2 06/20/19 1651 100 %     Weight --      Height --      Head Circumference --      Peak Flow --      Pain Score 06/20/19 1653 5     Pain Loc --      Pain Edu? --      Excl. in [place]? --    No data found.  Updated Vital Signs BP (!) 173/58 (BP Location: Right Arm)   Pulse 65   Temp 98.5 F (36.9 C) (Oral)   Resp (!) 22   SpO2 100%    Physical Exam Vitals and nursing note reviewed.  Constitutional:      General: She is not in acute distress.    Appearance: She is well-developed and normal weight. She is not ill-appearing or toxic-appearing.  HENT:     Head:  Normocephalic.  Cardiovascular:     Rate and Rhythm: Normal rate and regular rhythm.     Heart sounds: Normal heart sounds. No murmur.  Pulmonary:     Effort: Pulmonary effort is normal.     Breath sounds: Normal breath sounds.  Abdominal:     Palpations: Abdomen is soft. There is no mass.     Tenderness: There is no abdominal tenderness.  Musculoskeletal:        General: Normal range of motion.     Cervical back: Normal range of motion and neck supple.  Skin:    General: Skin is warm and dry.  Neurological:     General: No focal deficit present.     Mental Status: She is alert and oriented to person, place, and time.  Psychiatric:        Mood and Affect: Mood is anxious.        Behavior: Behavior normal.      UC Treatments / Results  Labs (all labs ordered are listed, but only abnormal results are displayed) Labs Reviewed - No data to display  EKG No acute changes on the 12 lead EKG  Radiology No results found.  Procedures Procedures (including critical care time)  Medications Ordered in UC Medications - No data to display  Initial Impression / Assessment and Plan / UC Course  I have reviewed the triage vital signs and the nursing notes.  Pertinent labs & imaging results that were available during my care of the patient were reviewed by me and considered in my medical decision making (see chart for details).    Final Clinical Impressions(s) / UC Diagnoses   Final diagnoses:  Atypical chest pain     Discharge Instructions     The cardiogram is normal and your heart sounds are normal.  I believe you have a low probability of cardiac pain.  If the pain returns, you can try the hydroxyzine again but if it persists then there are more sensitive test for heart disease than the cardiogram.  In that case, you can go to the emergency department for those more sensitive test.    ED Prescriptions    None     I have reviewed the PDMP during this encounter.     Robyn Haber, MD 06/20/19 [place], Nura Cahoon, MD 06/20/19 1753

## 2019-06-20 NOTE — Discharge Instructions (Addendum)
The cardiogram is normal and your heart sounds are normal.  I believe you have a low probability of cardiac pain.  If the pain returns, you can try the hydroxyzine again but if it persists then there are more sensitive test for heart disease than the cardiogram.  In that case, you can go to the emergency department for those more sensitive test.

## 2019-06-20 NOTE — ED Triage Notes (Signed)
Patient reports she was visiting a friend in the hospital when she noticed a dull pain in her chest. States this is not like when she had a panic attack, which typically presents as sharp chest pain. Reports that she did take one of her PRN hydroxyzine.

## 2019-06-28 DIAGNOSIS — E039 Hypothyroidism, unspecified: Secondary | ICD-10-CM | POA: Diagnosis not present

## 2019-06-29 ENCOUNTER — Other Ambulatory Visit: Payer: Self-pay | Admitting: Osteopathic Medicine

## 2019-06-29 LAB — TSH: TSH: 1.61 mIU/L (ref 0.40–4.50)

## 2019-06-29 MED ORDER — LEVOTHYROXINE SODIUM 75 MCG PO TABS: 75.0000 ug | ORAL_TABLET | Freq: Every day | ORAL | 3 refills | Status: DC

## 2019-08-19 ENCOUNTER — Telehealth: Payer: Self-pay | Admitting: *Deleted

## 2019-08-19 NOTE — Telephone Encounter (Signed)
Left patient a message to call and schedule annual if needed.

## 2019-09-24 ENCOUNTER — Ambulatory Visit (INDEPENDENT_AMBULATORY_CARE_PROVIDER_SITE_OTHER): Payer: Medicare HMO | Admitting: Family Medicine

## 2019-09-24 ENCOUNTER — Encounter: Payer: Self-pay | Admitting: Family Medicine

## 2019-09-24 VITALS — BP 131/52 | HR 69 | Ht 63.0 in | Wt 146.0 lb

## 2019-09-24 DIAGNOSIS — R0781 Pleurodynia: Secondary | ICD-10-CM | POA: Diagnosis not present

## 2019-09-24 NOTE — Patient Instructions (Signed)
Ice or heat.   Apply some pressure at night.    Go for xray if getting worse.

## 2019-09-24 NOTE — Progress Notes (Signed)
Acute Office Visit  Subjective:    Patient ID: Natasha Wilson, female    DOB: [birth year], 69 y.o.   MRN: [id number]  Chief Complaint  Patient presents with   Follow-up    HPI Patient is in today for right rib pain below her breast.   She stated that 1 week ago she was getting a rug out of her washing machine and while bending over to get this out she "fell" into the side of the wash tub due to it turning while she was reaching in to get the rug out.  She said that it is uncomfortable for her to take breaths. The pain is R sided. She has taken tylenol for this and said that it had gotten somewhat better.   She felt like Tuesday night it actually got worse and had difficulty sleeping so she made the appointment.  Past Medical History:  Diagnosis Date   Generalized anxiety disorder 05/24/2016   GERD (gastroesophageal reflux disease)    History of benign essential tremor 10/24/2015   Thyroid activity decreased 11/10/2015    Past Surgical History:  Procedure Laterality Date   ABDOMINAL HYSTERECTOMY     ANTERIOR AND POSTERIOR REPAIR N/A 02/09/2019   Procedure: ANTERIOR (CYSTOCELE) AND POSTERIOR REPAIR (RECTOCELE);  Surgeon: Bjorn Loser, MD;  Location: [facility];  Service: Urology;  Laterality: N/A;   bladder tack     BREAST CYST EXCISION     CHOLECYSTECTOMY     CYSTOSCOPY N/A 02/09/2019   Procedure: CYSTOSCOPY;  Surgeon: Bjorn Loser, MD;  Location: [facility];  Service: Urology;  Laterality: N/A;    Family History  Problem Relation Age of Onset   Alcohol abuse Father    Heart attack Father    Cancer Sister        LUNG   Depression Paternal Grandfather     Social History   Socioeconomic History   Marital status: Single    Spouse name: Not on file   Number of children: 2   Years of education: 14   Highest education level: Associate degree: academic program  Occupational History   Occupation: retired    Comment: [employer]  Tobacco Use   Smoking  status: Former Smoker    Quit date: 1995    Years since quitting: 26.5   Smokeless tobacco: Never Used  Scientific laboratory technician Use: Never used  Substance and Sexual Activity   Alcohol use: Yes    Alcohol/week: 4.0 standard drinks    Types: 4 Glasses of wine per week    Comment: 4 ounces a night   Drug use: Never   Sexual activity: Not Currently  Other Topics Concern   Not on file  Social History Narrative   Runs errands. Watch TV. Reads a lot and plays games on the computer.  Watches grandson some during the week as well. Had a bladder tac this year so will get back into walking this year.   Social Determinants of Health   Financial Resource Strain:    Difficulty of Paying Living Expenses:   Food Insecurity:    Worried About Charity fundraiser in the Last Year:    Arboriculturist in the Last Year:   Transportation Needs:    Film/video editor (Medical):    Lack of Transportation (Non-Medical):   Physical Activity:    Days of Exercise per Week:    Minutes of Exercise per Session:   Stress:    Feeling of  Stress :   Social Connections:    Frequency of Communication with Friends and Family:    Frequency of Social Gatherings with Friends and Family:    Attends Religious Services:    Active Member of Clubs or Organizations:    Attends Music therapist:    Marital Status:   Intimate Partner Violence:    Fear of Current or Ex-Partner:    Emotionally Abused:    Physically Abused:    Sexually Abused:     Outpatient Medications Prior to Visit  Medication Sig Dispense Refill   acetaminophen (TYLENOL) 500 MG tablet Take 1,000 mg by mouth every 6 (six) hours as needed for moderate pain.     diphenhydrAMINE (BENADRYL) 25 MG tablet Take 25 mg by mouth daily as needed for allergies.     lansoprazole (PREVACID) 15 MG capsule Take 15 mg by mouth daily as needed (acid reflux).     levothyroxine (SYNTHROID) 75 MCG tablet Take 1 tablet (75 mcg  total) by mouth daily before breakfast. 90 tablet 3   Melatonin 10 MG CAPS Take 10-20 mg by mouth at bedtime as needed (sleep). Around 1900 and 2100      rosuvastatin (CRESTOR) 20 MG tablet Take 1 tablet (20 mg total) by mouth daily. Take 1/2 tablet by mouth at bedtime for the first 8 days, then begin taking a whole tablet by mouth every day at bedtime. 90 tablet 3   HYDROcodone-acetaminophen (NORCO) 5-325 MG tablet Take 1-2 tablets by mouth every 6 (six) hours as needed for moderate pain. (Patient not taking: Reported on 05/17/2019) 30 tablet 0   nitrofurantoin, macrocrystal-monohydrate, (MACROBID) 100 MG capsule Take 1 capsule (100 mg total) by mouth 2 (two) times daily. 10 capsule 0   phenazopyridine (PYRIDIUM) 100 MG tablet Take 1 tablet (100 mg total) by mouth 3 (three) times daily as needed for pain. 15 tablet 0   Prasterone (INTRAROSA) 6.5 MG INST Place 1 ampule vaginally at bedtime. (Patient not taking: Reported on 05/17/2019) 30 each 12   No facility-administered medications prior to visit.    Allergies  Allergen Reactions   Cefuroxime Nausea And Vomiting    Red splotches on face.    Review of Systems     Objective:    Physical Exam Constitutional:      Appearance: She is well-developed.  HENT:     Head: Normocephalic and atraumatic.  Cardiovascular:     Rate and Rhythm: Normal rate and regular rhythm.     Heart sounds: Normal heart sounds.  Pulmonary:     Effort: Pulmonary effort is normal.     Breath sounds: Normal breath sounds.  Musculoskeletal:     Comments: Tenderness over the right anterior lower ribs near the sternum below her right breast.  No bruising or swelling externally.  Skin:    General: Skin is warm and dry.  Neurological:     Mental Status: She is alert and oriented to person, place, and time.  Psychiatric:        Behavior: Behavior normal.     BP (!) 131/52    Pulse 69    Ht 5\' 3"  (1.6 m)    Wt 146 lb (66.2 kg)    SpO2 98%    BMI 25.86  kg/m  Wt Readings from Last 3 Encounters:  09/24/19 146 lb (66.2 kg)  05/17/19 157 lb 1.9 oz (71.3 kg)  03/11/19 161 lb 1.9 oz (73.1 kg)    Health Maintenance Due  Topic Date  Due   PNA vac Low Risk Adult (1 of 2 - PCV13) Never done    There are no preventive care reminders to display for this patient.   Lab Results  Component Value Date   TSH 1.61 06/28/2019   Lab Results  Component Value Date   WBC 12.8 (H) 02/11/2019   HGB 11.2 (L) 02/11/2019   HCT 36.2 02/11/2019   MCV 84.2 02/11/2019   PLT 224 02/11/2019   Lab Results  Component Value Date   NA 141 06/11/2019   K 3.6 06/11/2019   CO2 25 06/11/2019   GLUCOSE 102 06/11/2019   BUN 9 06/11/2019   CREATININE 0.65 06/11/2019   BILITOT 0.6 06/11/2019   ALKPHOS 87 11/01/2016   AST 41 (H) 06/11/2019   ALT 30 (H) 06/11/2019   PROT 6.9 06/11/2019   ALBUMIN 4.3 11/01/2016   CALCIUM 9.4 06/11/2019   ANIONGAP 9 02/11/2019   Lab Results  Component Value Date   CHOL 133 06/11/2019   Lab Results  Component Value Date   HDL 49 (L) 06/11/2019   Lab Results  Component Value Date   LDLCALC 66 06/11/2019   Lab Results  Component Value Date   TRIG 95 06/11/2019   Lab Results  Component Value Date   CHOLHDL 2.7 06/11/2019   Lab Results  Component Value Date   HGBA1C 5.5 12/20/2016       Assessment & Plan:   Problem List Items Addressed This Visit    None    Visit Diagnoses    Rib pain    -  Primary   Relevant Orders   DG Ribs Unilateral Right     Rib pain-contusion versus possible fracture.  Worse with sneezing and cough.  She feels like it is actually gotten a little bit better.  So we discussed option of possible x-ray versus not.  Plan is that I will place the order and if over the weekend she continues to have discomfort and pain she will go on Monday but since she is feeling a lot better today, if she continues to feel better over the weekend then she will not go for the x-ray okay to use Tylenol,  heat or ice, and a little bit of pressure at night can be helpful with pain   No orders of the defined types were placed in this encounter.    Beatrice Lecher, MD

## 2019-09-24 NOTE — Progress Notes (Signed)
She stated that 1 week ago she was getting a rug out of her washing machine and while bending over to get this out she "fell" into the side of the wash tub due to it turning while she was reaching in to get the rug out.  She said that it is uncomfortable for her to take breaths. The pain is R sided. She has taken tylenol for this and said that it had gotten somewhat better.

## 2019-10-03 ENCOUNTER — Other Ambulatory Visit: Payer: Self-pay

## 2019-10-03 ENCOUNTER — Emergency Department
Admission: EM | Admit: 2019-10-03 | Discharge: 2019-10-03 | Disposition: A | Discharge status: Home / Self Care | Payer: Medicare HMO | Source: Home / Self Care | Attending: Family Medicine | Admitting: Family Medicine

## 2019-10-03 DIAGNOSIS — L740 Miliaria rubra: Secondary | ICD-10-CM | POA: Diagnosis not present

## 2019-10-03 MED ORDER — PREDNISONE 20 MG PO TABS: ORAL_TABLET | ORAL | 0 refills | Status: DC

## 2019-10-03 NOTE — ED Provider Notes (Signed)
[facility]    CSN: [id number] Arrival date & time: 10/03/19  1100      History   Chief Complaint Chief Complaint  Patient presents with  . Rash    neck    HPI Natasha Wilson is a 69 y.o. female.   [place]  She states that she was in a shady area most of the time, but did sweat profusely because of the heat.  Last night she developed a pruritic rash on her anterior neck and anterior knees.  She states that she used a new sunscreen spray in the area, but notes that there are also areas where she used the spray that do not have a rash.  She feels well otherwise.    The history is provided by the patient.  Rash Location: anterior neck and knees. Quality: burning, dryness, itchiness and redness   Quality: not blistering, not bruising, not draining, not painful, not peeling, not scaling, not swelling and not weeping   Severity:  Mild Onset quality:  Gradual Duration:  1 day Timing:  Constant Progression:  Worsening Chronicity:  New Context: sun exposure   Context: not animal contact, not chemical exposure, not exposure to similar rash, not food, not hot tub use, not insect bite/sting, not medications, not new detergent/soap, not nuts and not plant contact   Relieved by:  Nothing Worsened by:  Nothing Ineffective treatments:  None tried Associated symptoms: no fatigue, no fever, no induration, no joint pain, no myalgias, no nausea, no periorbital edema, no sore throat, no throat swelling, no tongue swelling and no URI     Past Medical History:  Diagnosis Date  . Generalized anxiety disorder 05/24/2016  . GERD (gastroesophageal reflux disease)   . History of benign essential tremor 10/24/2015  . Thyroid activity decreased 11/10/2015    Patient Active Problem List   Diagnosis Date Noted  . Cystocele with prolapse 02/09/2019  . Postmenopausal atrophic vaginitis 08/11/2018  . Prolapse of anterior vaginal wall 08/11/2018    . Posterior vaginal wall prolapse 08/11/2018  . Onychomycosis 11/01/2016  . History of chronic eczema 11/01/2016  . Generalized anxiety disorder 05/24/2016  . S/P colonoscopy 12/20/2015  . Thyroid activity decreased 11/10/2015  . Relationship problem between parent and child 10/24/2015  . Annual physical exam 10/24/2015  . History of benign essential tremor 10/24/2015  . History of hysterectomy for benign disease 10/24/2015    Past Surgical History:  Procedure Laterality Date  . ABDOMINAL HYSTERECTOMY    . ANTERIOR AND POSTERIOR REPAIR N/A 02/09/2019   Procedure: ANTERIOR (CYSTOCELE) AND POSTERIOR REPAIR (RECTOCELE);  Surgeon: Bjorn Loser, MD;  Location: [facility];  Service: Urology;  Laterality: N/A;  . bladder tack    . BREAST CYST EXCISION    . CHOLECYSTECTOMY    . CYSTOSCOPY N/A 02/09/2019   Procedure: CYSTOSCOPY;  Surgeon: Bjorn Loser, MD;  Location: [facility];  Service: Urology;  Laterality: N/A;    OB History    Gravida  2   Para  2   Term  2   Preterm      AB      Living  2     SAB      TAB      Ectopic      Multiple      Live Births               Home Medications    Prior to  Admission medications   Medication Sig Start Date End Date Taking? Authorizing Provider  acetaminophen (TYLENOL) 500 MG tablet Take 1,000 mg by mouth every 6 (six) hours as needed for moderate pain.    [provider]  diphenhydrAMINE (BENADRYL) 25 MG tablet Take 25 mg by mouth daily as needed for allergies.    [provider]  lansoprazole (PREVACID) 15 MG capsule Take 15 mg by mouth daily as needed (acid reflux).    [provider]  levothyroxine (SYNTHROID) 75 MCG tablet Take 1 tablet (75 mcg total) by mouth daily before breakfast. 06/29/19   Emeterio Reeve, DO  Melatonin 10 MG CAPS Take 10-20 mg by mouth at bedtime as needed (sleep). Around 1900 and 2100     [provider]  predniSONE (DELTASONE) 20 MG tablet Take one tab by  mouth twice daily for 4 days, then one daily for 3 days. Take with food. 10/03/19   Kandra Nicolas, MD  rosuvastatin (CRESTOR) 20 MG tablet Take 1 tablet (20 mg total) by mouth daily. Take 1/2 tablet by mouth at bedtime for the first 8 days, then begin taking a whole tablet by mouth every day at bedtime. 03/11/19   Orma Render, NP    Family History Family History  Problem Relation Age of Onset  . Alcohol abuse Father   . Heart attack Father   . Cancer Sister        LUNG  . Depression Paternal Grandfather     Social History Social History   Tobacco Use  . Smoking status: Former Smoker    Quit date: 1995    Years since quitting: 26.6  . Smokeless tobacco: Never Used  Vaping Use  . Vaping Use: Never used  Substance Use Topics  . Alcohol use: Yes    Alcohol/week: 4.0 standard drinks    Types: 4 Glasses of wine per week    Comment: 4 ounces a night  . Drug use: Never     Allergies   Latex and Cefuroxime   Review of Systems Review of Systems  Constitutional: Negative for chills, diaphoresis, fatigue and fever.  HENT: Negative for sore throat.   Gastrointestinal: Negative for nausea.  Musculoskeletal: Negative for arthralgias and myalgias.  Skin: Positive for rash.  All other systems reviewed and are negative.    Physical Exam Triage Vital Signs ED Triage Vitals  Enc Vitals Group     BP 10/03/19 1211 (!) 147/68     Pulse Rate 10/03/19 1211 65     Resp 10/03/19 1211 17     Temp 10/03/19 1211 97.9 F (36.6 C)     Temp Source 10/03/19 1211 Oral     SpO2 10/03/19 1211 97 %     Weight --      Height --      Head Circumference --      Peak Flow --      Pain Score 10/03/19 1214 2     Pain Loc --      Pain Edu? --      Excl. in [place]? --    No data found.  Updated Vital Signs BP (!) 147/68 (BP Location: Right Arm)   Pulse 65   Temp 97.9 F (36.6 C) (Oral)   Resp 17   SpO2 97%   Visual Acuity Right Eye Distance:   Left Eye Distance:   Bilateral Distance:     Right Eye Near:   Left Eye Near:    Bilateral Near:  Physical Exam Vitals and nursing note reviewed.  Constitutional:      General: She is not in acute distress. HENT:     Head: Normocephalic.     Right Ear: External ear normal.     Left Ear: External ear normal.     Nose: Nose normal.     Mouth/Throat:     Pharynx: Oropharynx is clear.  Eyes:     Conjunctiva/sclera: Conjunctivae normal.     Pupils: Pupils are equal, round, and reactive to light.  Cardiovascular:     Rate and Rhythm: Normal rate.  Pulmonary:     Effort: Pulmonary effort is normal.  Musculoskeletal:     Cervical back: Neck supple.     Right lower leg: No edema.     Left lower leg: No edema.  Lymphadenopathy:     Cervical: No cervical adenopathy.  Skin:    General: Skin is warm and dry.          Comments: Anterior neck and anterior knees have macular erythema as noted on diagram.  There are no vesicles or tenderness to palpation.  Neurological:     Mental Status: She is alert and oriented to person, place, and time.      UC Treatments / Results  Labs (all labs ordered are listed, but only abnormal results are displayed) Labs Reviewed - No data to display  EKG   Radiology No results found.  Procedures Procedures (including critical care time)  Medications Ordered in UC Medications - No data to display  Initial Impression / Assessment and Plan / UC Course  I have reviewed the triage vital signs and the nursing notes.  Pertinent labs & imaging results that were available during my care of the patient were reviewed by me and considered in my medical decision making (see chart for details).    Begin prednisone burst/taper. Followup with Family Doctor or dermatologist if not improved in 8 days.   Final Clinical Impressions(s) / UC Diagnoses   Final diagnoses:  Miliaria rubra     Discharge Instructions     May take Benadryl at bedtime for itching.  May apply anhydrous  lanolin.   ED Prescriptions    Medication Sig Dispense Auth. Provider   predniSONE (DELTASONE) 20 MG tablet Take one tab by mouth twice daily for 4 days, then one daily for 3 days. Take with food. 11 tablet Kandra Nicolas, MD        Kandra Nicolas, MD 10/04/19 1600

## 2019-10-03 NOTE — Discharge Instructions (Signed)
May take Benadryl at bedtime for itching.  May apply anhydrous lanolin.

## 2019-10-03 NOTE — ED Triage Notes (Signed)
Pt c/o rash on front of neck that started last night. Was on vacation and just returned today from [facility]. Pt says it itches and burns. Used a [employer] brand sunscreen for first time. Also has Hx of eczema.

## 2019-10-20 DIAGNOSIS — H2513 Age-related nuclear cataract, bilateral: Secondary | ICD-10-CM | POA: Diagnosis not present

## 2019-10-20 DIAGNOSIS — H5213 Myopia, bilateral: Secondary | ICD-10-CM | POA: Diagnosis not present

## 2019-10-21 ENCOUNTER — Other Ambulatory Visit: Payer: Self-pay | Admitting: Osteopathic Medicine

## 2019-10-21 ENCOUNTER — Ambulatory Visit (INDEPENDENT_AMBULATORY_CARE_PROVIDER_SITE_OTHER): Payer: Medicare HMO | Admitting: Osteopathic Medicine

## 2019-10-21 ENCOUNTER — Other Ambulatory Visit: Payer: Self-pay

## 2019-10-21 ENCOUNTER — Encounter: Payer: Self-pay | Admitting: Osteopathic Medicine

## 2019-10-21 VITALS — BP 135/61 | HR 80 | Temp 97.5°F | Wt 149.0 lb

## 2019-10-21 DIAGNOSIS — L989 Disorder of the skin and subcutaneous tissue, unspecified: Secondary | ICD-10-CM

## 2019-10-21 DIAGNOSIS — C44529 Squamous cell carcinoma of skin of other part of trunk: Secondary | ICD-10-CM | POA: Diagnosis not present

## 2019-10-21 DIAGNOSIS — R8290 Unspecified abnormal findings in urine: Secondary | ICD-10-CM | POA: Diagnosis not present

## 2019-10-21 DIAGNOSIS — R102 Pelvic and perineal pain: Secondary | ICD-10-CM

## 2019-10-21 DIAGNOSIS — R222 Localized swelling, mass and lump, trunk: Secondary | ICD-10-CM

## 2019-10-21 LAB — POCT URINALYSIS DIP (CLINITEK)
Bilirubin, UA: NEGATIVE
Blood, UA: NEGATIVE
Glucose, UA: NEGATIVE mg/dL
Ketones, POC UA: NEGATIVE mg/dL
Nitrite, UA: NEGATIVE
POC PROTEIN,UA: NEGATIVE
Spec Grav, UA: 1.02 (ref 1.010–1.025)
Urobilinogen, UA: 0.2 E.U./dL
pH, UA: 5 (ref 5.0–8.0)

## 2019-10-21 MED ORDER — NITROFURANTOIN MONOHYD MACRO 100 MG PO CAPS: 100.0000 mg | ORAL_CAPSULE | Freq: Two times a day (BID) | ORAL | 0 refills | Status: DC

## 2019-10-21 NOTE — Progress Notes (Signed)
Natasha Wilson is a 69 y.o. female who presents to  [place] at [facility]  today, 10/22/19, seeking care for the following:  . Skin check - spot on back  . Pelvic pain - UA as below      ASSESSMENT & PLAN with other pertinent findings:  The primary encounter diagnosis was Skin lesion of back. Diagnoses of Pelvic pressure in female and Abnormal urine findings were also pertinent to this visit.   No results found for this or any previous visit (from the past 24 hour(s)).   PRE-OP DIAGNOSIS: Abnormal Skin Lesion suspect malignancy  POST-OP DIAGNOSIS: Same  PROCEDURE: excisional skin biopsy Performing Physician: Emeterio Reeve Assisted by Gweneth Dimitri, medical student   The area surrounding the skin lesion was prepared and draped in the usual sterile manner. Anesthetized w/ 3 cc licaine w/ Epi. Ellitical incision made along skin lines taking care to give 67mm+ margin to the lesion. The lesion was removed in the usual manner by the biopsy method noted above. 2 subcutaneous sutures placed to approximate the incision. 4 horizontal mattress sutures placed and glue placed to close skin edges. Hemostasis was assured. The patient tolerated the procedure well.     Followup: The patient tolerated the procedure well without complications.  Standard post-procedure care is explained and return precautions are given.    There are no Patient Instructions on file for this visit.  Orders Placed This Encounter  Procedures  . Urine Culture  . POCT URINALYSIS DIP (CLINITEK)    Meds ordered this encounter  Medications  . nitrofurantoin, macrocrystal-monohydrate, (MACROBID) 100 MG capsule    Sig: Take 1 capsule (100 mg total) by mouth 2 (two) times daily.    Dispense:  10 capsule    Refill:  0       Follow-up instructions: Return for 11/01/19 - 11/04/19 FOR SUTURE REMOVAL  .                                         BP 135/61   Pulse 80   Temp (!) 97.5 F (36.4 C) (Oral)   Wt 149 lb (67.6 kg)   SpO2 98%   BMI 26.39 kg/m   No outpatient medications have been marked as taking for the 10/21/19 encounter (Office Visit) with Emeterio Reeve, DO.    Results for orders placed or performed in visit on 10/21/19 (from the past 72 hour(s))  POCT URINALYSIS DIP (CLINITEK)     Status: Abnormal   Collection Time: 10/21/19  7:50 AM  Result Value Ref Range   Color, UA yellow yellow   Clarity, UA clear clear   Glucose, UA negative negative mg/dL   Bilirubin, UA negative negative   Ketones, POC UA negative negative mg/dL   Spec Grav, UA 1.020 1.010 - 1.025   Blood, UA negative negative   pH, UA 5.0 5.0 - 8.0   POC PROTEIN,UA negative negative, trace   Urobilinogen, UA 0.2 0.2 or 1.0 E.U./dL   Nitrite, UA Negative Negative   Leukocytes, UA Trace (A) Negative    No results found.     All questions at time of visit were answered - patient instructed to contact office with any additional concerns or updates.  ER/RTC precautions were reviewed with the patient as applicable.   Please note: voice recognition software was used to produce this document, and  typos may escape review. Please contact Dr. Sheppard Coil for any needed clarifications.   Total encounter time: 40 minutes.

## 2019-10-23 LAB — URINE CULTURE
MICRO NUMBER:: 10847232
SPECIMEN QUALITY:: ADEQUATE

## 2019-10-26 ENCOUNTER — Other Ambulatory Visit: Payer: Self-pay | Admitting: Osteopathic Medicine

## 2019-10-26 DIAGNOSIS — Z1231 Encounter for screening mammogram for malignant neoplasm of breast: Secondary | ICD-10-CM

## 2019-11-01 ENCOUNTER — Ambulatory Visit (INDEPENDENT_AMBULATORY_CARE_PROVIDER_SITE_OTHER): Payer: Medicare HMO | Admitting: Osteopathic Medicine

## 2019-11-01 ENCOUNTER — Encounter: Payer: Self-pay | Admitting: Osteopathic Medicine

## 2019-11-01 VITALS — BP 148/71 | HR 61 | Wt 149.0 lb

## 2019-11-01 DIAGNOSIS — Z4802 Encounter for removal of sutures: Secondary | ICD-10-CM

## 2019-11-01 NOTE — Progress Notes (Signed)
HPI: Natasha Wilson is a 69 y.o. female who  has a past medical history of Generalized anxiety disorder (05/24/2016), GERD (gastroesophageal reflux disease), History of benign essential tremor (10/24/2015), and Thyroid activity decreased (11/10/2015).  she presents to [facility] today, 11/01/19,  for chief complaint of: Suture removal  Patient had an excision biopsy one week ago for a suspicious lesion on her left upper back. Patient is aware of pathology results. She has had some mild tenderness to the area and is uncomfortable with the stretching sensation around it. She denies drainage from the wound, fever, chills.   Past medical, surgical, social and family history reviewed:  Patient Active Problem List   Diagnosis Date Noted  . Cystocele with prolapse 02/09/2019  . Postmenopausal atrophic vaginitis 08/11/2018  . Prolapse of anterior vaginal wall 08/11/2018  . Posterior vaginal wall prolapse 08/11/2018  . Onychomycosis 11/01/2016  . History of chronic eczema 11/01/2016  . Generalized anxiety disorder 05/24/2016  . S/P colonoscopy 12/20/2015  . Thyroid activity decreased 11/10/2015  . Relationship problem between parent and child 10/24/2015  . Annual physical exam 10/24/2015  . History of benign essential tremor 10/24/2015  . History of hysterectomy for benign disease 10/24/2015    Past Surgical History:  Procedure Laterality Date  . ABDOMINAL HYSTERECTOMY    . ANTERIOR AND POSTERIOR REPAIR N/A 02/09/2019   Procedure: ANTERIOR (CYSTOCELE) AND POSTERIOR REPAIR (RECTOCELE);  Surgeon: Bjorn Loser, MD;  Location: [facility];  Service: Urology;  Laterality: N/A;  . bladder tack    . BREAST CYST EXCISION    . CHOLECYSTECTOMY    . CYSTOSCOPY N/A 02/09/2019   Procedure: CYSTOSCOPY;  Surgeon: Bjorn Loser, MD;  Location: [facility];  Service: Urology;  Laterality: N/A;    Social History   Tobacco Use  . Smoking status: Former Smoker    Quit date:  1995    Years since quitting: 26.6  . Smokeless tobacco: Never Used  Substance Use Topics  . Alcohol use: Yes    Alcohol/week: 4.0 standard drinks    Types: 4 Glasses of wine per week    Comment: 4 ounces a night    Family History  Problem Relation Age of Onset  . Alcohol abuse Father   . Heart attack Father   . Cancer Sister        LUNG  . Depression Paternal Grandfather      Current medication list and allergy/intolerance information reviewed:    Current Outpatient Medications  Medication Sig Dispense Refill  . acetaminophen (TYLENOL) 500 MG tablet Take 1,000 mg by mouth every 6 (six) hours as needed for moderate pain.    . diphenhydrAMINE (BENADRYL) 25 MG tablet Take 25 mg by mouth daily as needed for allergies.    Marland Kitchen lansoprazole (PREVACID) 15 MG capsule Take 15 mg by mouth daily as needed (acid reflux).    Marland Kitchen levothyroxine (SYNTHROID) 75 MCG tablet Take 1 tablet (75 mcg total) by mouth daily before breakfast. 90 tablet 3  . Melatonin 10 MG CAPS Take 10-20 mg by mouth at bedtime as needed (sleep). Around 1900 and 2100     . rosuvastatin (CRESTOR) 20 MG tablet Take 1 tablet (20 mg total) by mouth daily. Take 1/2 tablet by mouth at bedtime for the first 8 days, then begin taking a whole tablet by mouth every day at bedtime. 90 tablet 3  . nitrofurantoin, macrocrystal-monohydrate, (MACROBID) 100 MG capsule Take 1 capsule (100 mg total) by mouth 2 (two)  times daily. (Patient not taking: Reported on 11/01/2019) 10 capsule 0   No current facility-administered medications for this visit.    Allergies  Allergen Reactions  . Latex   . Cefuroxime Nausea And Vomiting    Red splotches on face.      Review of Systems:  Constitutional:  No  fever, no chills   HEENT: No  headache  Cardiac: No  chest pain  Respiratory:  No  shortness of breath. No  Cough  Gastrointestinal: No  abdominal pain  Musculoskeletal: No new myalgia/arthralgia  Skin: + recent excision  biopsy  Neurologic: No  weakness, No  dizziness  Psychiatric: No  concerns with depression  Exam:  BP (!) 148/71 (BP Location: Left Arm, Patient Position: Sitting)   Pulse 61   Wt 149 lb (67.6 kg)   SpO2 98%   BMI 26.39 kg/m   Constitutional: VS see above. General Appearance: alert, well-developed, well-nourished, NAD  Eyes: Normal lids and conjunctive, non-icteric sclera  Neck: No masses, trachea midline.  Respiratory: Normal respiratory effort. no wheeze, no rhonchi, no rales  Cardiovascular: S1/S2 normal, no murmur, no rub/gallop auscultated. RRR.   Gastrointestinal: Nontender, no masses.  Musculoskeletal: Gait normal. No clubbing/cyanosis of digits.   Neurological: Normal balance/coordination. No tremor. No cranial nerve deficit on limited exam.  Skin: 4cm well-healed incision on left upper back. Mild erythema to the area. 4 sutures present. No drainage from wound.    No results found for this or any previous visit (from the past 72 hour(s)).  No results found.   ASSESSMENT/PLAN: There were no encounter diagnoses.   Suture Removal  Patient presents for suture removal. The wound is well healed without signs of infection.  The sutures were removed. Wound care and activity instructions given.  Follow up if any other concerning lesions come up or as needed.  No orders of the defined types were placed in this encounter.   No orders of the defined types were placed in this encounter.   There are no Patient Instructions on file for this visit.      Visit summary with medication list and pertinent instructions was printed for patient to review. All questions at time of visit were answered - patient instructed to contact office with any additional concerns or updates. ER/RTC precautions were reviewed with the patient.   Please note: voice recognition software was used to produce this document, and typos may escape review. Please contact Dr. Sheppard Coil for any  needed clarifications.     Follow-up plan: No follow-ups on file.  Gweneth Dimitri, [facility] MS3

## 2019-12-15 ENCOUNTER — Ambulatory Visit (INDEPENDENT_AMBULATORY_CARE_PROVIDER_SITE_OTHER): Payer: Medicare HMO

## 2019-12-15 ENCOUNTER — Other Ambulatory Visit: Payer: Self-pay

## 2019-12-15 DIAGNOSIS — Z1231 Encounter for screening mammogram for malignant neoplasm of breast: Secondary | ICD-10-CM | POA: Diagnosis not present

## 2019-12-18 ENCOUNTER — Emergency Department (INDEPENDENT_AMBULATORY_CARE_PROVIDER_SITE_OTHER): Payer: Medicare HMO

## 2019-12-18 ENCOUNTER — Emergency Department (INDEPENDENT_AMBULATORY_CARE_PROVIDER_SITE_OTHER)
Admission: RE | Admit: 2019-12-18 | Discharge: 2019-12-18 | Disposition: A | Discharge status: Home / Self Care | Payer: Medicare HMO | Source: Ambulatory Visit

## 2019-12-18 ENCOUNTER — Ambulatory Visit: Payer: Self-pay

## 2019-12-18 ENCOUNTER — Other Ambulatory Visit: Payer: Self-pay

## 2019-12-18 VITALS — BP 148/75 | HR 70

## 2019-12-18 DIAGNOSIS — R0781 Pleurodynia: Secondary | ICD-10-CM

## 2019-12-18 DIAGNOSIS — M546 Pain in thoracic spine: Secondary | ICD-10-CM

## 2019-12-18 DIAGNOSIS — T148XXA Other injury of unspecified body region, initial encounter: Secondary | ICD-10-CM

## 2019-12-18 MED ORDER — CYCLOBENZAPRINE HCL 5 MG PO TABS: 5.0000 mg | ORAL_TABLET | Freq: Every day | ORAL | 0 refills | Status: DC

## 2019-12-18 NOTE — Discharge Instructions (Signed)
°  Flexeril (cyclobenzaprine) is a muscle relaxer and may cause drowsiness. Do not drink alcohol, drive, or operate heavy machinery while taking.  You may also take Tylenol and Ibuprofen as needed for pain and continue to alternate cool and warm compresses.  Call to schedule a follow up appointment with primary care or sports medicine later this week if not improving.

## 2019-12-18 NOTE — ED Provider Notes (Signed)
[facility]    CSN: [id number] Arrival date & time: 12/18/19  1240      History   Chief Complaint Chief Complaint  Patient presents with  . Appointment  . Back Pain    HPI Natasha Wilson is a 69 y.o. female.   HPI Natasha Wilson is a 69 y.o. female presenting to UC with c/o Right side flank/back pain that started yesterday during an exercise class where she was moving a ball and performing a twisting motion at the waist. She does report falling down some stairs about 1 week ago, slipping in the rain. She was mildly sore after the fall but felt better until pain yesterday while exercising.  Pt concerned she may have fractured a rib during the initial fall. She has taken tylenol with mild relief.  Pain is 8/10 severity, worse with certain movements.    Past Medical History:  Diagnosis Date  . Generalized anxiety disorder 05/24/2016  . GERD (gastroesophageal reflux disease)   . History of benign essential tremor 10/24/2015  . Thyroid activity decreased 11/10/2015    Patient Active Problem List   Diagnosis Date Noted  . Cystocele with prolapse 02/09/2019  . Postmenopausal atrophic vaginitis 08/11/2018  . Prolapse of anterior vaginal wall 08/11/2018  . Posterior vaginal wall prolapse 08/11/2018  . Onychomycosis 11/01/2016  . History of chronic eczema 11/01/2016  . Generalized anxiety disorder 05/24/2016  . S/P colonoscopy 12/20/2015  . Thyroid activity decreased 11/10/2015  . Relationship problem between parent and child 10/24/2015  . Annual physical exam 10/24/2015  . History of benign essential tremor 10/24/2015  . History of hysterectomy for benign disease 10/24/2015    Past Surgical History:  Procedure Laterality Date  . ABDOMINAL HYSTERECTOMY    . ANTERIOR AND POSTERIOR REPAIR N/A 02/09/2019   Procedure: ANTERIOR (CYSTOCELE) AND POSTERIOR REPAIR (RECTOCELE);  Surgeon: Bjorn Loser, MD;  Location: [facility];  Service: Urology;  Laterality: N/A;  . bladder tack     . BREAST CYST EXCISION    . CHOLECYSTECTOMY    . CYSTOSCOPY N/A 02/09/2019   Procedure: CYSTOSCOPY;  Surgeon: Bjorn Loser, MD;  Location: [facility];  Service: Urology;  Laterality: N/A;    OB History    Gravida  2   Para  2   Term  2   Preterm      AB      Living  2     SAB      TAB      Ectopic      Multiple      Live Births               Home Medications    Prior to Admission medications   Medication Sig Start Date End Date Taking? Authorizing Provider  acetaminophen (TYLENOL) 500 MG tablet Take 1,000 mg by mouth every 6 (six) hours as needed for moderate pain.   Yes [provider]  diphenhydrAMINE (BENADRYL) 25 MG tablet Take 25 mg by mouth daily as needed for allergies.   Yes [provider]  levothyroxine (SYNTHROID) 75 MCG tablet Take 1 tablet (75 mcg total) by mouth daily before breakfast. 06/29/19  Yes Emeterio Reeve, DO  Melatonin 10 MG CAPS Take 10-20 mg by mouth at bedtime as needed (sleep). Around 1900 and 2100    Yes [provider]  rosuvastatin (CRESTOR) 20 MG tablet Take 1 tablet (20 mg total) by mouth daily. Take 1/2 tablet by mouth at bedtime for the first  8 days, then begin taking a whole tablet by mouth every day at bedtime. 03/11/19  Yes Early, Coralee Pesa, NP  cyclobenzaprine (FLEXERIL) 5 MG tablet Take 1 tablet (5 mg total) by mouth at bedtime. 12/18/19   Noe Gens, PA-C  lansoprazole (PREVACID) 15 MG capsule Take 15 mg by mouth daily as needed (acid reflux).    [provider]  nitrofurantoin, macrocrystal-monohydrate, (MACROBID) 100 MG capsule Take 1 capsule (100 mg total) by mouth 2 (two) times daily. Patient not taking: Reported on 11/01/2019 10/21/19   Emeterio Reeve, DO    Family History Family History  Problem Relation Age of Onset  . Alcohol abuse Father   . Heart attack Father   . Cancer Sister        LUNG  . Depression Paternal Grandfather     Social History Social History    Tobacco Use  . Smoking status: Former Smoker    Quit date: 1995    Years since quitting: 26.8  . Smokeless tobacco: Never Used  Vaping Use  . Vaping Use: Never used  Substance Use Topics  . Alcohol use: Yes    Alcohol/week: 4.0 standard drinks    Types: 4 Glasses of wine per week    Comment: 4 ounces a night  . Drug use: Never     Allergies   Latex and Cefuroxime   Review of Systems Review of Systems  Genitourinary: Negative for dysuria, frequency and hematuria.  Musculoskeletal: Positive for back pain and myalgias.  Skin: Negative for color change, rash and wound.     Physical Exam Triage Vital Signs ED Triage Vitals [12/18/19 1319]  Enc Vitals Group     BP (!) 148/75     Pulse Rate 70     Resp      Temp      Temp src      SpO2 97 %     Weight      Height      Head Circumference      Peak Flow      Pain Score 8     Pain Loc      Pain Edu?      Excl. in [place]?    No data found.  Updated Vital Signs BP (!) 148/75 (BP Location: Right Arm)   Pulse 70   SpO2 97%   Visual Acuity Right Eye Distance:   Left Eye Distance:   Bilateral Distance:    Right Eye Near:   Left Eye Near:    Bilateral Near:     Physical Exam Vitals and nursing note reviewed.  Constitutional:      General: She is not in acute distress.    Appearance: Normal appearance. She is well-developed. She is not ill-appearing, toxic-appearing or diaphoretic.  HENT:     Head: Normocephalic and atraumatic.  Cardiovascular:     Rate and Rhythm: Normal rate and regular rhythm.  Pulmonary:     Effort: Pulmonary effort is normal. No respiratory distress.     Breath sounds: Normal breath sounds. No stridor. No wheezing, rhonchi or rales.  Musculoskeletal:        General: Tenderness present. Normal range of motion.     Cervical back: Normal range of motion and neck supple. No tenderness.       Back:     Comments: No spinal tenderness. Full ROM upper and lower extremities Normal gait,  increased pain with rotation at the waist.   Skin:  General: Skin is warm and dry.  Neurological:     Mental Status: She is alert and oriented to person, place, and time.  Psychiatric:        Behavior: Behavior normal.      UC Treatments / Results  Labs (all labs ordered are listed, but only abnormal results are displayed) Labs Reviewed - No data to display  EKG   Radiology DG Ribs Unilateral W/Chest Right  Result Date: 12/18/2019 CLINICAL DATA:  Mid to lower right-sided posterior rib pain. EXAM: RIGHT RIBS AND CHEST - 3+ VIEW COMPARISON:  02/11/2019 FINDINGS: No fracture or bone lesion. Cardiac silhouette normal in size.  No mediastinal or hilar masses. Clear lungs.  No pleural effusion or pneumothorax. IMPRESSION: 1. No rib fracture or rib lesion. 2. No active cardiopulmonary disease. Electronically Signed   By: Lajean Manes M.D.   On: 12/18/2019 14:45    Procedures Procedures (including critical care time)  Medications Ordered in UC Medications - No data to display  Initial Impression / Assessment and Plan / UC Course  I have reviewed the triage vital signs and the nursing notes.  Pertinent labs & imaging results that were available during my care of the patient were reviewed by me and considered in my medical decision making (see chart for details).     Reassured pt of no fractures  Hx and exam c/w muscle strain Encourage symptomatic tx at home F/u with PCP or Sports Medicine in 1-2 weeks if not improving.  Final Clinical Impressions(s) / UC Diagnoses   Final diagnoses:  Acute right-sided thoracic back pain  Muscle strain     Discharge Instructions      Flexeril (cyclobenzaprine) is a muscle relaxer and may cause drowsiness. Do not drink alcohol, drive, or operate heavy machinery while taking.  You may also take Tylenol and Ibuprofen as needed for pain and continue to alternate cool and warm compresses.  Call to schedule a follow up appointment with  primary care or sports medicine later this week if not improving.     ED Prescriptions    Medication Sig Dispense Auth. Provider   cyclobenzaprine (FLEXERIL) 5 MG tablet Take 1 tablet (5 mg total) by mouth at bedtime. 5 tablet Noe Gens, PA-C     PDMP not reviewed this encounter.   Noe Gens, [place] 12/19/19 1129

## 2019-12-18 NOTE — ED Triage Notes (Signed)
Patient fell over a week down some stairs from the stairs being wet from the rain.  Patient was at exercise class yesterday and now having low back pain.  Patient has been taking Tylenol without much relief.

## 2020-02-23 ENCOUNTER — Ambulatory Visit (INDEPENDENT_AMBULATORY_CARE_PROVIDER_SITE_OTHER): Payer: Medicare HMO | Admitting: Osteopathic Medicine

## 2020-02-23 ENCOUNTER — Other Ambulatory Visit: Payer: Self-pay

## 2020-02-23 ENCOUNTER — Encounter: Payer: Self-pay | Admitting: Osteopathic Medicine

## 2020-02-23 VITALS — BP 138/58 | HR 76 | Temp 98.6°F | Wt 152.7 lb

## 2020-02-23 DIAGNOSIS — L245 Irritant contact dermatitis due to other chemical products: Secondary | ICD-10-CM | POA: Diagnosis not present

## 2020-02-23 MED ORDER — BETAMETHASONE DIPROPIONATE 0.05 % EX CREA: TOPICAL_CREAM | Freq: Two times a day (BID) | CUTANEOUS | 1 refills | Status: DC

## 2020-02-23 NOTE — Patient Instructions (Signed)
Use the prescription as-needed for redness/itching.  Avoid retinol! OK to use anything for eczema (CeraVe, Aveeno, Cetaphil are all fine)

## 2020-02-23 NOTE — Progress Notes (Signed)
Natasha Wilson is a 69 y.o. female who presents to  [facility]  today, 02/23/20, seeking care for the following:  . Rash on face - think might be reaction to OTC retinol she just started, reports redness and itching below L cheek      ASSESSMENT & PLAN with other pertinent findings:  The encounter diagnosis was Irritant contact dermatitis due to other chemical products.    Patient Instructions  Use the prescription as-needed for redness/itching.  Avoid retinol! OK to use anything for eczema (CeraVe, Aveeno, Cetaphil are all fine)    No orders of the defined types were placed in this encounter.   Meds ordered this encounter  Medications  . betamethasone dipropionate 0.05 % cream    Sig: Apply topically 2 (two) times daily. To affected area(s) as needed    Dispense:  15 g    Refill:  1       Follow-up instructions: Return if symptoms worsen or fail to improve.                                         BP (!) 138/58   Pulse 76   Temp 98.6 F (37 C)   Wt 152 lb 11.2 oz (69.3 kg)   SpO2 97%   BMI 27.05 kg/m   Current Meds  Medication Sig  . acetaminophen (TYLENOL) 500 MG tablet Take 1,000 mg by mouth every 6 (six) hours as needed for moderate pain.  . cyclobenzaprine (FLEXERIL) 5 MG tablet Take 1 tablet (5 mg total) by mouth at bedtime.  . diphenhydrAMINE (BENADRYL) 25 MG tablet Take 25 mg by mouth daily as needed for allergies.  Marland Kitchen lansoprazole (PREVACID) 15 MG capsule Take 15 mg by mouth daily as needed (acid reflux).  Marland Kitchen levothyroxine (SYNTHROID) 75 MCG tablet Take 1 tablet (75 mcg total) by mouth daily before breakfast.  . Melatonin 10 MG CAPS Take 10-20 mg by mouth at bedtime as needed (sleep). Around 1900 and 2100  . nitrofurantoin, macrocrystal-monohydrate, (MACROBID) 100 MG capsule Take 1 capsule (100 mg total) by mouth 2 (two) times daily.  . rosuvastatin (CRESTOR) 20  MG tablet Take 1 tablet (20 mg total) by mouth daily. Take 1/2 tablet by mouth at bedtime for the first 8 days, then begin taking a whole tablet by mouth every day at bedtime.    No results found for this or any previous visit (from the past 72 hour(s)).  No results found.     All questions at time of visit were answered - patient instructed to contact office with any additional concerns or updates.  ER/RTC precautions were reviewed with the patient as applicable.   Please note: voice recognition software was used to produce this document, and typos may escape review. Please contact Dr. Sheppard Coil for any needed clarifications.

## 2020-03-06 ENCOUNTER — Other Ambulatory Visit: Payer: Self-pay | Admitting: Nurse Practitioner

## 2020-03-06 DIAGNOSIS — E785 Hyperlipidemia, unspecified: Secondary | ICD-10-CM

## 2020-03-07 ENCOUNTER — Telehealth: Payer: Self-pay | Admitting: General Practice

## 2020-03-08 NOTE — Telephone Encounter (Signed)
Documentation

## 2020-03-15 DIAGNOSIS — F410 Panic disorder [episodic paroxysmal anxiety] without agoraphobia: Secondary | ICD-10-CM

## 2020-03-15 HISTORY — DX: Panic disorder (episodic paroxysmal anxiety): F41.0

## 2020-03-17 ENCOUNTER — Ambulatory Visit (INDEPENDENT_AMBULATORY_CARE_PROVIDER_SITE_OTHER): Payer: Medicare HMO | Admitting: Family Medicine

## 2020-03-17 DIAGNOSIS — Z0000 Encounter for general adult medical examination without abnormal findings: Secondary | ICD-10-CM | POA: Diagnosis not present

## 2020-03-17 NOTE — Progress Notes (Signed)
MEDICARE ANNUAL WELLNESS VISIT  03/17/2020  Telephone Visit Disclaimer This Medicare AWV was conducted by telephone due to national recommendations for restrictions regarding the COVID-19 Pandemic (e.g. social distancing).  I verified, using two identifiers, that I am speaking with Natasha Wilson or their authorized healthcare agent. I discussed the limitations, risks, security, and privacy concerns of performing an evaluation and management service by telephone and the potential availability of an in-person appointment in the future. The patient expressed understanding and agreed to proceed.  Location of Patient: Home Location of Provider (nurse):  In the office.  Subjective:    Natasha Wilson is a 70 y.o. female patient of Natasha Nielsen, DO who had a Medicare Annual Wellness Visit today via telephone. [name] is Retired and lives alone. she has 2 children. she reports that she is socially active and does interact with friends/family regularly. she is moderately physically active and enjoys reading.  Patient Care Team: Natasha Nielsen, DO as PCP - General (Osteopathic Medicine)  Advanced Directives 03/17/2020 02/22/2019 02/11/2019 02/09/2019 02/08/2019 02/16/2018 10/24/2015  Does Patient Have a Medical Advance Directive? Yes Yes Yes Yes Yes Yes No  Type of Advance Directive Living will;Healthcare Power of [employer] Power of [place];Living will Healthcare Power of [place];Living will Healthcare Power of [employer] Power of [employer] Power of [place];Living will -  Does patient want to make changes to medical advance directive? No - Patient declined No - Patient declined No - Patient declined No - Patient declined - Yes (MAU/Ambulatory/Procedural Areas - Information given) -  Copy of Healthcare Power of Attorney in Chart? No - copy requested Yes - validated most recent copy scanned in chart (See row information) No - copy requested Yes - validated most recent copy  scanned in chart (See row information) Yes - validated most recent copy scanned in chart (See row information) No - copy requested -  Would patient like information on creating a medical advance directive? No - Patient declined No - Patient declined No - Patient declined - - - Yes - Educational materials given    Hospital Utilization Over the Past 12 Months: # of hospitalizations or ER visits: 2 # of surgeries: 0  Review of Systems    Patient reports that her overall health is better compared to last year.  History obtained from chart review and the patient  Patient Reported Readings (BP, Pulse, CBG, Weight, etc) none  Pain Assessment Pain : No/denies pain     Current Medications & Allergies (verified) Allergies as of 03/17/2020      Reactions   Latex    Cefuroxime Nausea And Vomiting   Red splotches on face.      Medication List       Accurate as of March 17, 2020  9:31 AM. If you have any questions, ask your nurse or doctor.        acetaminophen 500 MG tablet Commonly known as: TYLENOL Take 1,000 mg by mouth every 6 (six) hours as needed for moderate pain.   betamethasone dipropionate 0.05 % cream Apply topically 2 (two) times daily. To affected area(s) as needed   cetirizine 10 MG tablet Commonly known as: ZYRTEC Take 10 mg by mouth daily.   cyclobenzaprine 5 MG tablet Commonly known as: FLEXERIL Take 1 tablet (5 mg total) by mouth at bedtime.   diphenhydrAMINE 25 MG tablet Commonly known as: BENADRYL Take 25 mg by mouth daily as needed for allergies.   lansoprazole 15 MG capsule Commonly  known as: PREVACID Take 15 mg by mouth daily as needed (acid reflux).   levothyroxine 75 MCG tablet Commonly known as: SYNTHROID Take 1 tablet (75 mcg total) by mouth daily before breakfast.   Melatonin 10 MG Caps Take 10-20 mg by mouth at bedtime as needed (sleep). Around 1900 and 2100   nitrofurantoin (macrocrystal-monohydrate) 100 MG capsule Commonly known  as: MACROBID Take 1 capsule (100 mg total) by mouth 2 (two) times daily.   rosuvastatin 20 MG tablet Commonly known as: CRESTOR TAKE 1/2 (ONE-HALF) TABLET BY MOUTH AT BEDTIME FOR 8 DAYS THEN TAKE 1 TABLET EVERY DAY AT BEDTIME.       History (reviewed): Past Medical History:  Diagnosis Date  . Generalized anxiety disorder 05/24/2016  . GERD (gastroesophageal reflux disease)   . History of benign essential tremor 10/24/2015  . Panic attacks 03/15/2020  . Panic attacks   . Skin cancer    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  . Thyroid activity decreased 11/10/2015   Past Surgical History:  Procedure Laterality Date  . ABDOMINAL HYSTERECTOMY    . ANTERIOR AND POSTERIOR REPAIR N/A 02/09/2019   Procedure: ANTERIOR (CYSTOCELE) AND POSTERIOR REPAIR (RECTOCELE);  Surgeon: Bjorn Loser, MD;  Location: [facility];  Service: Urology;  Laterality: N/A;  . bladder tack    . BREAST CYST EXCISION    . CHOLECYSTECTOMY    . CYSTOSCOPY N/A 02/09/2019   Procedure: CYSTOSCOPY;  Surgeon: Bjorn Loser, MD;  Location: [facility];  Service: Urology;  Laterality: N/A;  . SKIN CANCER EXCISION N/A    from back   Family History  Problem Relation Age of Onset  . Alcohol abuse Father   . Heart attack Father   . Cancer Sister        LUNG  . Depression Paternal Grandfather   . Dementia Mother   . Stroke Mother    Social History   Socioeconomic History  . Marital status: Single    Spouse name: Not on file  . Number of children: 2  . Years of education: 54  . Highest education level: Associate degree: academic program  Occupational History  . Occupation: retired    Comment: [employer]  Tobacco Use  . Smoking status: Former Smoker    Quit date: 1995    Years since quitting: 27.0  . Smokeless tobacco: Never Used  Vaping Use  . Vaping Use: Never used  Substance and Sexual Activity  . Alcohol use: Yes    Alcohol/week: 4.0 standard drinks    Types: 4 Glasses of wine per week    Comment: 4 ounces a night  .  Drug use: Never  . Sexual activity: Not Currently  Other Topics Concern  . Not on file  Social History Narrative   Runs errands. Watch TV. Reads a lot and plays games on the computer.  Watches grandson some during the week as well. Had a bladder tac this year so will get back into walking this year. Enjoys going to aerobics class three times a week. She also enjoys reading.   Social Determinants of Health   Financial Resource Strain: Low Risk   . Difficulty of Paying Living Expenses: Not very hard  Food Insecurity: No Food Insecurity  . Worried About Charity fundraiser in the Last Year: Never true  . Ran Out of Food in the Last Year: Never true  Transportation Needs: No Transportation Needs  . Lack of Transportation (Medical): No  . Lack of Transportation (Non-Medical): No  Physical Activity:  Sufficiently Active  . Days of Exercise per Week: 3 days  . Minutes of Exercise per Session: 60 min  Stress: Stress Concern Present  . Feeling of Stress : To some extent  Social Connections: Moderately Integrated  . Frequency of Communication with Friends and Family: Twice a week  . Frequency of Social Gatherings with Friends and Family: Three times a week  . Attends Religious Services: More than 4 times per year  . Active Member of Clubs or Organizations: Yes  . Attends Archivist Meetings: More than 4 times per year  . Marital Status: Divorced    Activities of Daily Living In your present state of health, do you have any difficulty performing the following activities: 03/17/2020  Hearing? N  Vision? N  Difficulty concentrating or making decisions? N  Walking or climbing stairs? N  Dressing or bathing? N  Doing errands, shopping? N  Preparing Food and eating ? N  Using the Toilet? N  In the past six months, have you accidently leaked urine? Y  Comment since bladder, she has had some leakage at times  Do you have problems with loss of bowel control? N  Managing your  Medications? N  Managing your Finances? N  Housekeeping or managing your Housekeeping? N  Some recent data might be hidden    Patient Education/ Literacy How often do you need to have someone help you when you read instructions, pamphlets, or other written materials from your doctor or pharmacy?: 1 - Never What is the last grade level you completed in school?: Associates Degree  Exercise Current Exercise Habits: Home exercise routine, Type of exercise: Other - see comments (aerobics class), Time (Minutes): 60, Frequency (Times/Week): 3, Weekly Exercise (Minutes/Week): 180, Intensity: Moderate  Diet Patient reports consuming 2 meals a day and 2 snack(s) a day Patient reports that her primary diet is: Regular Patient reports that she does have regular access to food.   Depression Screen PHQ 2/9 Scores 03/17/2020 09/24/2019 02/22/2019 02/16/2018 01/02/2018 11/01/2016 05/24/2016  PHQ - 2 Score 2 1 0 1 1 2 6   PHQ- 9 Score 3 - - 1 8 8 21      Fall Risk Fall Risk  03/17/2020 02/22/2019 02/16/2018 11/01/2016  Falls in the past year? 1 0 0 No  Number falls in past yr: 0 0 - -  Injury with Fall? 0 0 - -  Risk for fall due to : No Fall Risks - - -  Follow up Falls evaluation completed;Education provided;Falls prevention discussed Falls prevention discussed Falls prevention discussed -     Objective:  Tearah P Malick seemed alert and oriented and she participated appropriately during our telephone visit.  Blood Pressure Weight BMI  BP Readings from Last 3 Encounters:  02/23/20 (!) 138/58  12/18/19 (!) 148/75  11/01/19 (!) 148/71   Wt Readings from Last 3 Encounters:  02/23/20 152 lb 11.2 oz (69.3 kg)  11/01/19 149 lb (67.6 kg)  10/21/19 149 lb (67.6 kg)   BMI Readings from Last 1 Encounters:  02/23/20 27.05 kg/m    *Unable to obtain current vital signs, weight, and BMI due to telephone visit type  Hearing/Vision  . Lelania did not seem to have difficulty with hearing/understanding during the  telephone conversation . Reports that she has had a formal eye exam by an eye care professional within the past year . Reports that she has not had a formal hearing evaluation within the past year *Unable to fully assess hearing and vision  during telephone visit type  Cognitive Function: 6CIT Screen 03/17/2020 02/22/2019 02/16/2018  What Year? 0 points 0 points 0 points  What month? 0 points 0 points 0 points  What time? 0 points 0 points 0 points  Count back from 20 0 points 0 points 0 points  Months in reverse 0 points 0 points 0 points  Repeat phrase 0 points 2 points 0 points  Total Score 0 2 0   (Normal:0-7, Significant for Dysfunction: >8)  Normal Cognitive Function Screening: Yes   Immunization & Health Maintenance Record Immunization History  Administered Date(s) Administered  . Moderna Sars-Covid-2 Vaccination 05/03/2019, 06/01/2019, 03/07/2020  . Tdap 02/08/2006, 11/01/2016    Health Maintenance  Topic Date Due  . INFLUENZA VACCINE  06/01/2020 (Originally 10/03/2019)  . PNA vac Low Risk Adult (1 of 2 - PCV13) 03/17/2021 (Originally 10/18/2015)  . COLONOSCOPY (Pts 45-45yrs Insurance coverage will need to be confirmed)  08/08/2020  . MAMMOGRAM  12/14/2021  . TETANUS/TDAP  11/02/2026  . DEXA SCAN  Completed  . COVID-19 Vaccine  Completed  . Hepatitis C Screening  Completed       Assessment  This is a routine wellness examination for [employer].  Health Maintenance: Due or Overdue There are no preventive care reminders to display for this patient.  Kanesha P Dave does not need a referral for [employer] Assistance: Care Management:   no Social Work:    no Prescription Assistance:  no Nutrition/Diabetes Education:  no   Plan:  Personalized Goals Goals Addressed            This Visit's Progress   . Patient Stated       03/17/2020 AWV Goal: Fall Prevention  . Over the next year, patient will decrease their risk for falls by: o Using assistive devices, such  as a cane or walker, as needed o Identifying fall risks within their home and correcting them by: - Removing throw rugs - Adding handrails to stairs or ramps - Removing clutter and keeping a clear pathway throughout the home - Increasing light, especially at night - Adding shower handles/bars - Raising toilet seat o Identifying potential personal risk factors for falls: - Medication side effects - Incontinence/urgency - Vestibular dysfunction - Hearing loss - Musculoskeletal disorders - Neurological disorders - Orthostatic hypotension        Personalized Health Maintenance & Screening Recommendations  Pneumococcal vaccine  Influenza vaccine Shingles vaccine Lung Cancer Screening Recommended: no (Low Dose CT Chest recommended if Age 74-80 years, 30 pack-year currently smoking OR have quit w/in past 15 years) Hepatitis C Screening recommended: no HIV Screening recommended: no  Advanced Directives: Written information was not prepared per patient's request.  Referrals & Orders No orders of the defined types were placed in this encounter.   Follow-up Plan . Follow-up with Emeterio Reeve, DO as planned . Schedule your appointment to have your Pneumonia vaccine, Shingles vaccine and Influenza vaccine. . Patient stated that she had a bump on her neck. She is going to make an in-office appointment with the provider.   I have personally reviewed and noted the following in the patient's chart:   . Medical and social history . Use of alcohol, tobacco or illicit drugs  . Current medications and supplements . Functional ability and status . Nutritional status . Physical activity . Advanced directives . List of other physicians . Hospitalizations, surgeries, and ER visits in previous 12 months . Vitals . Screenings to include cognitive, depression, and falls .  Referrals and appointments  In addition, I have reviewed and discussed with Ezequiel Kayser certain preventive  protocols, quality metrics, and best practice recommendations. A written personalized care plan for preventive services as well as general preventive health recommendations is available and can be mailed to the patient at her request.      Tinnie Gens, RN  03/17/2020

## 2020-03-17 NOTE — Patient Instructions (Addendum)
 Fall Prevention in the Home, Adult Falls can cause injuries and can happen to people of all ages. There are many things you can do to make your home safe and to help prevent falls. Ask for help when making these changes. What actions can I take to prevent falls? General Instructions  Use good lighting in all rooms. Replace any light bulbs that burn out.  Turn on the lights in dark areas. Use night-lights.  Keep items that you use often in easy-to-reach places. Lower the shelves around your home if needed.  Set up your furniture so you have a clear path. Avoid moving your furniture around.  Do not have throw rugs or other things on the floor that can make you trip.  Avoid walking on wet floors.  If any of your floors are uneven, fix them.  Add color or contrast paint or tape to clearly mark and help you see: ? Grab bars or handrails. ? First and last steps of staircases. ? Where the edge of each step is.  If you use a stepladder: ? Make sure that it is fully opened. Do not climb a closed stepladder. ? Make sure the sides of the stepladder are locked in place. ? Ask someone to hold the stepladder while you use it.  Know where your pets are when moving through your home. What can I do in the bathroom?  Keep the floor dry. Clean up any water on the floor right away.  Remove soap buildup in the tub or shower.  Use nonskid mats or decals on the floor of the tub or shower.  Attach bath mats securely with double-sided, nonslip rug tape.  If you need to sit down in the shower, use a plastic, nonslip stool.  Install grab bars by the toilet and in the tub and shower. Do not use towel bars as grab bars.      What can I do in the bedroom?  Make sure that you have a light by your bed that is easy to reach.  Do not use any sheets or blankets for your bed that hang to the floor.  Have a firm chair with side arms that you can use for support when you get dressed. What can I do  in the kitchen?  Clean up any spills right away.  If you need to reach something above you, use a step stool with a grab bar.  Keep electrical cords out of the way.  Do not use floor polish or wax that makes floors slippery. What can I do with my stairs?  Do not leave any items on the stairs.  Make sure that you have a light switch at the top and the bottom of the stairs.  Make sure that there are handrails on both sides of the stairs. Fix handrails that are broken or loose.  Install nonslip stair treads on all your stairs.  Avoid having throw rugs at the top or bottom of the stairs.  Choose a carpet that does not hide the edge of the steps on the stairs.  Check carpeting to make sure that it is firmly attached to the stairs. Fix carpet that is loose or worn. What can I do on the outside of my home?  Use bright outdoor lighting.  Fix the edges of walkways and driveways and fix any cracks.  Remove anything that might make you trip as you walk through a door, such as a raised step or threshold.  Trim   any bushes or trees on paths to your home.  Check to see if handrails are loose or broken and that both sides of all steps have handrails.  Install guardrails along the edges of any raised decks and porches.  Clear paths of anything that can make you trip, such as tools or rocks.  Have leaves, snow, or ice cleared regularly.  Use sand or salt on paths during winter.  Clean up any spills in your garage right away. This includes grease or oil spills. What other actions can I take?  Wear shoes that: ? Have a low heel. Do not wear high heels. ? Have rubber bottoms. ? Feel good on your feet and fit well. ? Are closed at the toe. Do not wear open-toe sandals.  Use tools that help you move around if needed. These include: ? Canes. ? Walkers. ? Scooters. ? Crutches.  Review your medicines with your doctor. Some medicines can make you feel dizzy. This can increase your  chance of falling. Ask your doctor what else you can do to help prevent falls. Where to find more information  Centers for Disease Control and Prevention, [employer]: www.cdc.gov  National Institute on Aging: www.nia.nih.gov Contact a doctor if:  You are afraid of falling at home.  You feel weak, drowsy, or dizzy at home.  You fall at home. Summary  There are many simple things that you can do to make your home safe and to help prevent falls.  Ways to make your home safe include removing things that can make you trip and installing grab bars in the bathroom.  Ask for help when making these changes in your home. This information is not intended to replace advice given to you by your health care provider. Make sure you discuss any questions you have with your health care provider. Document Revised: 09/22/2019 Document Reviewed: 09/22/2019 Elsevier Patient Education  2021 Elsevier Inc.   Health Maintenance, Female Adopting a healthy lifestyle and getting preventive care are important in promoting health and wellness. Ask your health care provider about:  The right schedule for you to have regular tests and exams.  Things you can do on your own to prevent diseases and keep yourself healthy. What should I know about diet, weight, and exercise? Eat a healthy diet  Eat a diet that includes plenty of vegetables, fruits, low-fat dairy products, and lean protein.  Do not eat a lot of foods that are high in solid fats, added sugars, or sodium.   Maintain a healthy weight Body mass index (BMI) is used to identify weight problems. It estimates body fat based on height and weight. Your health care provider can help determine your BMI and help you achieve or maintain a healthy weight. Get regular exercise Get regular exercise. This is one of the most important things you can do for your health. Most adults should:  Exercise for at least 150 minutes each week. The exercise should increase  your heart rate and make you sweat (moderate-intensity exercise).  Do strengthening exercises at least twice a week. This is in addition to the moderate-intensity exercise.  Spend less time sitting. Even light physical activity can be beneficial. Watch cholesterol and blood lipids Have your blood tested for lipids and cholesterol at 70 years of age, then have this test every 5 years. Have your cholesterol levels checked more often if:  Your lipid or cholesterol levels are high.  You are older than 70 years of age.  You are at   high risk for heart disease. What should I know about cancer screening? Depending on your health history and family history, you may need to have cancer screening at various ages. This may include screening for:  Breast cancer.  Cervical cancer.  Colorectal cancer.  Skin cancer.  Lung cancer. What should I know about heart disease, diabetes, and high blood pressure? Blood pressure and heart disease  High blood pressure causes heart disease and increases the risk of stroke. This is more likely to develop in people who have high blood pressure readings, are of African descent, or are overweight.  Have your blood pressure checked: ? Every 3-5 years if you are 18-39 years of age. ? Every year if you are 40 years old or older. Diabetes Have regular diabetes screenings. This checks your fasting blood sugar level. Have the screening done:  Once every three years after age 40 if you are at a normal weight and have a low risk for diabetes.  More often and at a younger age if you are overweight or have a high risk for diabetes. What should I know about preventing infection? Hepatitis B If you have a higher risk for hepatitis B, you should be screened for this virus. Talk with your health care provider to find out if you are at risk for hepatitis B infection. Hepatitis C Testing is recommended for:  Everyone born from 1945 through 1965.  Anyone with known  risk factors for hepatitis C. Sexually transmitted infections (STIs)  Get screened for STIs, including gonorrhea and chlamydia, if: ? You are sexually active and are younger than 70 years of age. ? You are older than 70 years of age and your health care provider tells you that you are at risk for this type of infection. ? Your sexual activity has changed since you were last screened, and you are at increased risk for chlamydia or gonorrhea. Ask your health care provider if you are at risk.  Ask your health care provider about whether you are at high risk for HIV. Your health care provider may recommend a prescription medicine to help prevent HIV infection. If you choose to take medicine to prevent HIV, you should first get tested for HIV. You should then be tested every 3 months for as long as you are taking the medicine. Pregnancy  If you are about to stop having your period (premenopausal) and you may become pregnant, seek counseling before you get pregnant.  Take 400 to 800 micrograms (mcg) of folic acid every day if you become pregnant.  Ask for birth control (contraception) if you want to prevent pregnancy. Osteoporosis and menopause Osteoporosis is a disease in which the bones lose minerals and strength with aging. This can result in bone fractures. If you are 65 years old or older, or if you are at risk for osteoporosis and fractures, ask your health care provider if you should:  Be screened for bone loss.  Take a calcium or vitamin D supplement to lower your risk of fractures.  Be given hormone replacement therapy (HRT) to treat symptoms of menopause. Follow these instructions at home: Lifestyle  Do not use any products that contain nicotine or tobacco, such as cigarettes, e-cigarettes, and chewing tobacco. If you need help quitting, ask your health care provider.  Do not use street drugs.  Do not share needles.  Ask your health care provider for help if you need support or  information about quitting drugs. Alcohol use  Do not drink alcohol   if: ? Your health care provider tells you not to drink. ? You are pregnant, may be pregnant, or are planning to become pregnant.  If you drink alcohol: ? Limit how much you use to 0-1 drink a day. ? Limit intake if you are breastfeeding.  Be aware of how much alcohol is in your drink. In the [place] one drink equals one 12 oz bottle of beer (355 mL), one 5 oz glass of wine (148 mL), or one 1 oz glass of hard liquor (44 mL). General instructions  Schedule regular health, dental, and eye exams.  Stay current with your vaccines.  Tell your health care provider if: ? You often feel depressed. ? You have ever been abused or do not feel safe at home. Summary  Adopting a healthy lifestyle and getting preventive care are important in promoting health and wellness.  Follow your health care provider's instructions about healthy diet, exercising, and getting tested or screened for diseases.  Follow your health care provider's instructions on monitoring your cholesterol and blood pressure. This information is not intended to replace advice given to you by your health care provider. Make sure you discuss any questions you have with your health care provider. Document Revised: 02/11/2018 Document Reviewed: 02/11/2018 Elsevier Patient Education  2021 [place] Maintenance Summary and Written Plan of Care  Natasha Wilson ,  Thank you for allowing me to perform your Medicare Annual Wellness Visit and for your ongoing commitment to your health.   Health Maintenance & Immunization History Health Maintenance  Topic Date Due  . INFLUENZA VACCINE  06/01/2020 (Originally 10/03/2019)  . PNA vac Low Risk Adult (1 of 2 - PCV13) 03/17/2021 (Originally 10/18/2015)  . COLONOSCOPY (Pts 45-87yrs Insurance coverage will need to be confirmed)  08/08/2020  . MAMMOGRAM  12/14/2021  . TETANUS/TDAP   11/02/2026  . DEXA SCAN  Completed  . COVID-19 Vaccine  Completed  . Hepatitis C Screening  Completed   Immunization History  Administered Date(s) Administered  . Moderna Sars-Covid-2 Vaccination 05/03/2019, 06/01/2019, 03/07/2020  . Tdap 02/08/2006, 11/01/2016    These are the patient goals that we discussed: Goals Addressed            This Visit's Progress   . Patient Stated       03/17/2020 AWV Goal: Fall Prevention  . Over the next year, patient will decrease their risk for falls by: o Using assistive devices, such as a cane or walker, as needed o Identifying fall risks within their home and correcting them by: - Removing throw rugs - Adding handrails to stairs or ramps - Removing clutter and keeping a clear pathway throughout the home - Increasing light, especially at night - Adding shower handles/bars - Raising toilet seat o Identifying potential personal risk factors for falls: - Medication side effects - Incontinence/urgency - Vestibular dysfunction - Hearing loss - Musculoskeletal disorders - Neurological disorders - Orthostatic hypotension          This is a list of Health Maintenance Items that are overdue or due now: Pneumonia vaccine Shingles vaccine Influenza Vaccine  Orders/Referrals Placed Today: No orders of the defined types were placed in this encounter.  Follow-up Plan . Follow-up with Natasha Reeve, DO as planned . Schedule your appointment to have your Pneumonia vaccine, Shingles vaccine and Influenza vaccine. . Patient stated that she had a bump on her neck. She is going to make an in-office appointment with the provider.

## 2020-03-30 ENCOUNTER — Ambulatory Visit (INDEPENDENT_AMBULATORY_CARE_PROVIDER_SITE_OTHER): Payer: Medicare HMO | Admitting: Osteopathic Medicine

## 2020-03-30 ENCOUNTER — Other Ambulatory Visit: Payer: Self-pay | Admitting: Osteopathic Medicine

## 2020-03-30 ENCOUNTER — Encounter: Payer: Self-pay | Admitting: Osteopathic Medicine

## 2020-03-30 VITALS — BP 160/72 | HR 74 | Temp 97.5°F | Wt 151.1 lb

## 2020-03-30 DIAGNOSIS — D492 Neoplasm of unspecified behavior of bone, soft tissue, and skin: Secondary | ICD-10-CM

## 2020-03-30 DIAGNOSIS — L570 Actinic keratosis: Secondary | ICD-10-CM | POA: Diagnosis not present

## 2020-03-30 DIAGNOSIS — L821 Other seborrheic keratosis: Secondary | ICD-10-CM

## 2020-03-30 NOTE — Progress Notes (Signed)
HPI: Natasha Wilson is a 70 y.o. female who  has a past medical history of Generalized anxiety disorder (05/24/2016), GERD (gastroesophageal reflux disease), History of benign essential tremor (10/24/2015), Panic attacks (03/15/2020), Panic attacks, Skin cancer, and Thyroid activity decreased (11/10/2015).  she presents to [facility] today, 03/30/20,  for chief complaint of:  Bump on neck - pt concerned for skin cancer. Has been present on lower R neck for about a month, itching. On exam, small papule flesh colored, rough texture, approx 0.6 cm diameter.      ASSESSMENT/PLAN: The encounter diagnosis was Abnormal skin growth.   Ddx: most concerning for possible SCC so shave biopsy performed today. Ddx also would include atypical seborrheic keratosis or basal cell, wart  PROCEDURE: SHAVE BIOSPY ABNORMAL SKIN LESION performed by myself, assisted by Mikael Spray medical student. 0.5 cc Lido/Epi administered after appropriate sterilization of skin, lesion was removed w/ dermablade and underlying tissue was cauterized w/ Bovie. Dressed w/ nonstick bandage and neosporin ointment. Pt tolerated procedure well.   No orders of the defined types were placed in this encounter.    No orders of the defined types were placed in this encounter.   There are no Patient Instructions on file for this visit.    Follow-up plan: Return for RECHECK PENDING RESULTS / IF WORSE OR CHANGE.                                                 ################################################# ################################################# ################################################# #################################################    Current Meds  Medication Sig  . acetaminophen (TYLENOL) 500 MG tablet Take 1,000 mg by mouth every 6 (six) hours as needed for moderate pain.  . cetirizine (ZYRTEC) 10 MG tablet Take 10 mg by  mouth daily.  . lansoprazole (PREVACID) 15 MG capsule Take 15 mg by mouth daily as needed (acid reflux).  Marland Kitchen levothyroxine (SYNTHROID) 75 MCG tablet Take 1 tablet (75 mcg total) by mouth daily before breakfast.  . nitrofurantoin, macrocrystal-monohydrate, (MACROBID) 100 MG capsule Take 1 capsule (100 mg total) by mouth 2 (two) times daily.  . rosuvastatin (CRESTOR) 20 MG tablet TAKE 1/2 (ONE-HALF) TABLET BY MOUTH AT BEDTIME FOR 8 DAYS THEN TAKE 1 TABLET EVERY DAY AT BEDTIME.    Allergies  Allergen Reactions  . Latex   . Cefuroxime Nausea And Vomiting    Red splotches on face.       Review of Systems: Pertinent (+) and (-) ROS in HPI as above   Exam:  BP (!) 160/72 (BP Location: Left Arm, Patient Position: Sitting, Cuff Size: Normal)   Pulse 74   Temp (!) 97.5 F (36.4 C) (Oral)   Wt 151 lb 1.9 oz (68.5 kg)   BMI 26.77 kg/m   Constitutional: VS see above. General Appearance: alert, well-developed, well-nourished, NAD       Visit summary with medication list and pertinent instructions was printed for patient to review, patient was advised to alert [place] if any updates are needed. All questions at time of visit were answered - patient instructed to contact office with any additional concerns. ER/RTC precautions were reviewed with the patient and understanding verbalized.   Please note: voice recognition software was used to produce this document, and typos may escape review. Please contact Dr. Sheppard Coil for any needed clarifications.    Follow up plan: Return for  RECHECK PENDING RESULTS / IF WORSE OR CHANGE.

## 2020-04-18 ENCOUNTER — Encounter: Payer: Self-pay | Admitting: Osteopathic Medicine

## 2020-04-18 DIAGNOSIS — L570 Actinic keratosis: Secondary | ICD-10-CM

## 2020-04-18 DIAGNOSIS — L821 Other seborrheic keratosis: Secondary | ICD-10-CM

## 2020-04-26 DIAGNOSIS — L570 Actinic keratosis: Secondary | ICD-10-CM | POA: Diagnosis not present

## 2020-04-26 DIAGNOSIS — D2362 Other benign neoplasm of skin of left upper limb, including shoulder: Secondary | ICD-10-CM | POA: Diagnosis not present

## 2020-04-26 DIAGNOSIS — D485 Neoplasm of uncertain behavior of skin: Secondary | ICD-10-CM | POA: Diagnosis not present

## 2020-05-24 ENCOUNTER — Other Ambulatory Visit: Payer: Self-pay

## 2020-05-24 ENCOUNTER — Ambulatory Visit (INDEPENDENT_AMBULATORY_CARE_PROVIDER_SITE_OTHER): Payer: Medicare HMO | Admitting: Physician Assistant

## 2020-05-24 VITALS — BP 141/68 | HR 70 | Temp 98.5°F | Ht 63.0 in | Wt 152.0 lb

## 2020-05-24 DIAGNOSIS — R300 Dysuria: Secondary | ICD-10-CM | POA: Diagnosis not present

## 2020-05-24 DIAGNOSIS — R1030 Lower abdominal pain, unspecified: Secondary | ICD-10-CM

## 2020-05-24 LAB — POCT URINALYSIS DIP (CLINITEK)
Bilirubin, UA: NEGATIVE
Blood, UA: NEGATIVE
Glucose, UA: NEGATIVE mg/dL
Ketones, POC UA: NEGATIVE mg/dL
Leukocytes, UA: NEGATIVE
Nitrite, UA: NEGATIVE
POC PROTEIN,UA: NEGATIVE
Spec Grav, UA: 1.015 (ref 1.010–1.025)
Urobilinogen, UA: 0.2 E.U./dL
pH, UA: 5.5 (ref 5.0–8.0)

## 2020-05-24 MED ORDER — PHENAZOPYRIDINE HCL 200 MG PO TABS: 200.0000 mg | ORAL_TABLET | Freq: Three times a day (TID) | ORAL | 0 refills | Status: AC

## 2020-05-24 NOTE — Progress Notes (Signed)
Subjective:    Patient ID: Natasha Wilson, female    DOB: 12-Oct-1950, 70 y.o.   MRN: [id number]  HPI Pt is a 70 yo female who presents to the clinic with 1 day of lower abdominal pain and dysuria. History of UTI but not since august of last year. No fever, chills, headache, flank pain. Not tried anything to make better. Pain comes in waves like a "spasm".    Review of Systems See HPI.     Objective:   Physical Exam Vitals reviewed.  Constitutional:      Appearance: Normal appearance.  HENT:     Head: Normocephalic.  Cardiovascular:     Rate and Rhythm: Normal rate and regular rhythm.     Pulses: Normal pulses.     Heart sounds: Normal heart sounds.  Pulmonary:     Effort: Pulmonary effort is normal.     Breath sounds: Normal breath sounds.  Abdominal:     General: Bowel sounds are normal. There is no distension.     Palpations: Abdomen is soft. There is no mass.     Tenderness: There is abdominal tenderness. There is no right CVA tenderness, left CVA tenderness, guarding or rebound.     Comments: Very minimal discomfort right over suprapubic area.   Musculoskeletal:     Right lower leg: No edema.     Left lower leg: No edema.  Neurological:     General: No focal deficit present.     Mental Status: She is alert and oriented to person, place, and time.  Psychiatric:        Mood and Affect: Mood normal.       .. Results for orders placed or performed in visit on 05/24/20  Urine Culture   Specimen: Urine  Result Value Ref Range   MICRO NUMBER: [id number]    SPECIMEN QUALITY: Adequate    Sample Source URINE    STATUS: FINAL    Result:      Less than 10,000 CFU/mL of single Gram negative organism isolated. No further testing will be performed. If clinically indicated, recollection using a method to minimize contamination, with prompt transfer to Urine Culture Transport Tube, is recommended.  POCT URINALYSIS DIP (CLINITEK)  Result Value Ref Range   Color, UA yellow  yellow   Clarity, UA clear clear   Glucose, UA negative negative mg/dL   Bilirubin, UA negative negative   Ketones, POC UA negative negative mg/dL   Spec Grav, UA 1.015 1.010 - 1.025   Blood, UA negative negative   pH, UA 5.5 5.0 - 8.0   POC PROTEIN,UA negative negative, trace   Urobilinogen, UA 0.2 0.2 or 1.0 E.U./dL   Nitrite, UA Negative Negative   Leukocytes, UA Negative Negative       Assessment & Plan:  Marland KitchenMarland KitchenLatresha was seen today for dysuria.  Diagnoses and all orders for this visit:  Dysuria -     POCT URINALYSIS DIP (CLINITEK) -     Urine Culture -     phenazopyridine (PYRIDIUM) 200 MG tablet; Take 1 tablet (200 mg total) by mouth 3 (three) times daily for 2 days.  Lower abdominal pain -     POCT URINALYSIS DIP (CLINITEK) -     Urine Culture -     phenazopyridine (PYRIDIUM) 200 MG tablet; Take 1 tablet (200 mg total) by mouth 3 (three) times daily for 2 days.   No signs of bacterial infection in dipstick and no red flag PE signs.  Will culture. Start with pyridium and increase hydration. Avoid caffeine and spicy foods. Follow up as needed or if symptoms worsen.

## 2020-05-24 NOTE — Patient Instructions (Signed)
Urinary Tract Infection, Adult A urinary tract infection (UTI) is an infection of any part of the urinary tract. The urinary tract includes:  The kidneys.  The ureters.  The bladder.  The urethra. These organs make, store, and get rid of pee (urine) in the body. What are the causes? This infection is caused by germs (bacteria) in your genital area. These germs grow and cause swelling (inflammation) of your urinary tract. What increases the risk? The following factors may make you more likely to develop this condition:  Using a small, thin tube (catheter) to drain pee.  Not being able to control when you pee or poop (incontinence).  Being female. If you are female, these things can increase the risk: ? Using these methods to prevent pregnancy:  A medicine that kills sperm (spermicide).  A device that blocks sperm (diaphragm). ? Having low levels of a female hormone (estrogen). ? Being pregnant. You are more likely to develop this condition if:  You have genes that add to your risk.  You are sexually active.  You take antibiotic medicines.  You have trouble peeing because of: ? A prostate that is bigger than normal, if you are female. ? A blockage in the part of your body that drains pee from the bladder. ? A kidney stone. ? A nerve condition that affects your bladder. ? Not getting enough to drink. ? Not peeing often enough.  You have other conditions, such as: ? Diabetes. ? A weak disease-fighting system (immune system). ? Sickle cell disease. ? Gout. ? Injury of the spine. What are the signs or symptoms? Symptoms of this condition include:  Needing to pee right away.  Peeing small amounts often.  Pain or burning when peeing.  Blood in the pee.  Pee that smells bad or not like normal.  Trouble peeing.  Pee that is cloudy.  Fluid coming from the vagina, if you are female.  Pain in the belly or lower back. Other symptoms include:  Vomiting.  Not  feeling hungry.  Feeling mixed up (confused). This may be the first symptom in older adults.  Being tired and grouchy (irritable).  A fever.  Watery poop (diarrhea). How is this treated?  Taking antibiotic medicine.  Taking other medicines.  Drinking enough water. In some cases, you may need to see a specialist. Follow these instructions at home: Medicines  Take over-the-counter and prescription medicines only as told by your doctor.  If you were prescribed an antibiotic medicine, take it as told by your doctor. Do not stop taking it even if you start to feel better. General instructions  Make sure you: ? Pee until your bladder is empty. ? Do not hold pee for a long time. ? Empty your bladder after sex. ? Wipe from front to back after peeing or pooping if you are a female. Use each tissue one time when you wipe.  Drink enough fluid to keep your pee pale yellow.  Keep all follow-up visits.   Contact a doctor if:  You do not get better after 1-2 days.  Your symptoms go away and then come back. Get help right away if:  You have very bad back pain.  You have very bad pain in your lower belly.  You have a fever.  You have chills.  You feeling like you will vomit or you vomit. Summary  A urinary tract infection (UTI) is an infection of any part of the urinary tract.  This condition is caused by   germs in your genital area.  There are many risk factors for a UTI.  Treatment includes antibiotic medicines.  Drink enough fluid to keep your pee pale yellow. This information is not intended to replace advice given to you by your health care provider. Make sure you discuss any questions you have with your health care provider. Document Revised: 10/01/2019 Document Reviewed: 10/01/2019 Elsevier Patient Education  2021 Elsevier Inc.  

## 2020-05-26 ENCOUNTER — Encounter: Payer: Self-pay | Admitting: Physician Assistant

## 2020-05-26 LAB — URINE CULTURE
MICRO NUMBER:: 11685417
SPECIMEN QUALITY:: ADEQUATE

## 2020-05-26 NOTE — Progress Notes (Signed)
No significant bacteria found in urine. How are you feeling?

## 2020-05-27 ENCOUNTER — Encounter: Payer: Self-pay | Admitting: Physician Assistant

## 2020-06-06 ENCOUNTER — Encounter: Payer: Self-pay | Admitting: Osteopathic Medicine

## 2020-06-06 ENCOUNTER — Other Ambulatory Visit: Payer: Self-pay | Admitting: Nurse Practitioner

## 2020-06-06 DIAGNOSIS — E785 Hyperlipidemia, unspecified: Secondary | ICD-10-CM

## 2020-06-09 ENCOUNTER — Encounter: Payer: Self-pay | Admitting: Osteopathic Medicine

## 2020-06-09 DIAGNOSIS — E785 Hyperlipidemia, unspecified: Secondary | ICD-10-CM

## 2020-06-09 DIAGNOSIS — Z0000 Encounter for general adult medical examination without abnormal findings: Secondary | ICD-10-CM

## 2020-06-09 MED ORDER — ROSUVASTATIN CALCIUM 20 MG PO TABS: 20.0000 mg | ORAL_TABLET | Freq: Every day | ORAL | 0 refills | Status: DC

## 2020-06-15 DIAGNOSIS — E785 Hyperlipidemia, unspecified: Secondary | ICD-10-CM | POA: Diagnosis not present

## 2020-06-15 DIAGNOSIS — Z0000 Encounter for general adult medical examination without abnormal findings: Secondary | ICD-10-CM | POA: Diagnosis not present

## 2020-06-15 LAB — COMPLETE METABOLIC PANEL WITH GFR
AG Ratio: 2 (calc) (ref 1.0–2.5)
ALT: 13 U/L (ref 6–29)
AST: 20 U/L (ref 10–35)
Albumin: 4.5 g/dL (ref 3.6–5.1)
Alkaline phosphatase (APISO): 74 U/L (ref 37–153)
BUN: 10 mg/dL (ref 7–25)
CO2: 26 mmol/L (ref 20–32)
Calcium: 9.6 mg/dL (ref 8.6–10.4)
Chloride: 104 mmol/L (ref 98–110)
Creat: 0.68 mg/dL (ref 0.50–0.99)
GFR, Est African American: 103 mL/min/{1.73_m2} (ref >=60)
GFR, Est Non African American: 89 mL/min/{1.73_m2} (ref >=60)
Globulin: 2.3 g/dL (calc) (ref 1.9–3.7)
Glucose, Bld: 102 mg/dL — ABNORMAL HIGH (ref 65–99)
Potassium: 4.1 mmol/L (ref 3.5–5.3)
Sodium: 139 mmol/L (ref 135–146)
Total Bilirubin: 0.8 mg/dL (ref 0.2–1.2)
Total Protein: 6.8 g/dL (ref 6.1–8.1)

## 2020-06-15 LAB — CBC WITH DIFFERENTIAL/PLATELET
Absolute Monocytes: 442 cells/uL (ref 200–950)
Basophils Absolute: 39 cells/uL (ref 0–200)
Basophils Relative: 0.6 %
Eosinophils Absolute: 169 cells/uL (ref 15–500)
Eosinophils Relative: 2.6 %
HCT: 42.1 % (ref 35.0–45.0)
Hemoglobin: 13.4 g/dL (ref 11.7–15.5)
Lymphs Abs: 1495 cells/uL (ref 850–3900)
MCH: 27 pg (ref 27.0–33.0)
MCHC: 31.8 g/dL — ABNORMAL LOW (ref 32.0–36.0)
MCV: 84.7 fL (ref 80.0–100.0)
MPV: 9.8 fL (ref 7.5–12.5)
Monocytes Relative: 6.8 %
Neutro Abs: 4355 cells/uL (ref 1500–7800)
Neutrophils Relative %: 67 %
Platelets: 202 10*3/uL (ref 140–400)
RBC: 4.97 10*6/uL (ref 3.80–5.10)
RDW: 13.1 % (ref 11.0–15.0)
Total Lymphocyte: 23 %
WBC: 6.5 10*3/uL (ref 3.8–10.8)

## 2020-06-15 LAB — LIPID PANEL W/REFLEX DIRECT LDL
Cholesterol: 179 mg/dL (ref <200)
HDL: 82 mg/dL (ref >=50)
LDL Cholesterol (Calc): 77 mg/dL (calc)
Non-HDL Cholesterol (Calc): 97 mg/dL (calc) (ref <130)
Total CHOL/HDL Ratio: 2.2 (calc) (ref <5.0)
Triglycerides: 113 mg/dL (ref <150)

## 2020-06-17 ENCOUNTER — Encounter: Payer: Self-pay | Admitting: Osteopathic Medicine

## 2020-06-21 ENCOUNTER — Other Ambulatory Visit: Payer: Self-pay | Admitting: Osteopathic Medicine

## 2020-06-27 ENCOUNTER — Encounter: Payer: Self-pay | Admitting: Osteopathic Medicine

## 2020-08-09 DIAGNOSIS — Z86010 Personal history of colonic polyps: Secondary | ICD-10-CM | POA: Diagnosis not present

## 2020-08-09 DIAGNOSIS — Z1211 Encounter for screening for malignant neoplasm of colon: Secondary | ICD-10-CM | POA: Diagnosis not present

## 2020-08-09 DIAGNOSIS — K5730 Diverticulosis of large intestine without perforation or abscess without bleeding: Secondary | ICD-10-CM | POA: Diagnosis not present

## 2020-08-09 LAB — HM COLONOSCOPY

## 2020-08-21 ENCOUNTER — Other Ambulatory Visit: Payer: Self-pay

## 2020-08-21 ENCOUNTER — Ambulatory Visit (INDEPENDENT_AMBULATORY_CARE_PROVIDER_SITE_OTHER): Payer: Medicare HMO | Admitting: Osteopathic Medicine

## 2020-08-21 VITALS — BP 158/74 | HR 66 | Temp 98.0°F | Wt 155.0 lb

## 2020-08-21 DIAGNOSIS — H9202 Otalgia, left ear: Secondary | ICD-10-CM | POA: Diagnosis not present

## 2020-08-21 DIAGNOSIS — H6982 Other specified disorders of Eustachian tube, left ear: Secondary | ICD-10-CM

## 2020-08-21 DIAGNOSIS — I10 Essential (primary) hypertension: Secondary | ICD-10-CM | POA: Diagnosis not present

## 2020-08-21 MED ORDER — IPRATROPIUM BROMIDE 0.06 % NA SOLN: 2.0000 | Freq: Four times a day (QID) | NASAL | 1 refills | Status: DC

## 2020-08-21 MED ORDER — PREDNISONE 20 MG PO TABS: 20.0000 mg | ORAL_TABLET | Freq: Two times a day (BID) | ORAL | 0 refills | Status: DC

## 2020-08-21 NOTE — Progress Notes (Signed)
Natasha Wilson is a 70 y.o. female who presents to  [place] at [facility]  today, 08/21/20, seeking care for the following:  L ear pain x 1 week, sharp, no triggers, lasts few mintues at a time and resolves, no tinnitus or hearing loss, no headache, mild sinus pressure, no sore throat, no cough, no fever, no skin pain on temple, no jaw pain      ASSESSMENT & PLAN with other pertinent findings:  The primary encounter diagnosis was Otalgia of left ear. Diagnoses of Eustachian tube dysfunction, left and Essential hypertension were also pertinent to this visit.  Best Ddx at this point eustachian tube dysfunction, no significant s/s sinusitis, consider abx if no improvement  Pt to monitor BP at home, goals discussed, diastolic at goal but systolic is above goal and has been for some time, condier lower dose Rx     BP Readings from Last 3 Encounters:  08/21/20 (!) 158/74  05/24/20 (!) 141/68  03/30/20 (!) 160/72    Patient Instructions  Ear pain: eustachian tube dysfunction (see printed info) Rx sent for steroids, nasal spay, can use OTC antihistamine + decongestant  BP: goal <140 top number, check BP at home Will call next week to see how you're doing If worse/change let me know If ear no better w/ above treatment will refer to ENT   No orders of the defined types were placed in this encounter.   Meds ordered this encounter  Medications   predniSONE (DELTASONE) 20 MG tablet    Sig: Take 1 tablet (20 mg total) by mouth 2 (two) times daily with a meal.    Dispense:  10 tablet    Refill:  0   ipratropium (ATROVENT) 0.06 % nasal spray    Sig: Place 2 sprays into both nostrils 4 (four) times daily. As needed for runny nose / postnasal drip    Dispense:  15 mL    Refill:  1     See below for relevant physical exam findings  See below for recent lab and imaging results reviewed  Medications, allergies, PMH, PSH, SocH, FamH reviewed  below    Follow-up instructions: Return if symptoms worsen or fail to improve.                                        Exam:  BP (!) 158/74 (BP Location: Left Arm, Patient Position: Sitting, Cuff Size: Normal)   Pulse 66   Temp 98 F (36.7 C) (Oral)   Wt 155 lb (70.3 kg)   BMI 27.46 kg/m  Constitutional: VS see above. General Appearance: alert, well-developed, well-nourished, NAD HEENT: TM and canals WNL bilaterally, TMJ bilaterally normal ROM and no clicking, no sinus pain on palpation  Neck: No masses, trachea midline.  Respiratory: Normal respiratory effort. no wheeze, no rhonchi, no rales Cardiovascular: S1/S2 normal, no murmur, no rub/gallop auscultated. RRR.  Musculoskeletal: Gait normal. Symmetric and independent movement of all extremities Neurological: Normal balance/coordination. No tremor. Skin: warm, dry, intact.  Psychiatric: Normal judgment/insight. Normal mood and affect. Oriented x3.   Current Meds  Medication Sig   ipratropium (ATROVENT) 0.06 % nasal spray Place 2 sprays into both nostrils 4 (four) times daily. As needed for runny nose / postnasal drip   predniSONE (DELTASONE) 20 MG tablet Take 1 tablet (20 mg total) by mouth 2 (two) times daily  with a meal.    Allergies  Allergen Reactions   Latex Rash   Cefuroxime Nausea And Vomiting    Red splotches on face.    Patient Active Problem List   Diagnosis Date Noted   Cystocele with prolapse 02/09/2019   Postmenopausal atrophic vaginitis 08/11/2018   Prolapse of anterior vaginal wall 08/11/2018   Posterior vaginal wall prolapse 08/11/2018   Onychomycosis 11/01/2016   History of chronic eczema 11/01/2016   Generalized anxiety disorder 05/24/2016   S/P colonoscopy 12/20/2015   Thyroid activity decreased 11/10/2015   Relationship problem between parent and child 10/24/2015   Annual physical exam 10/24/2015   History of benign essential tremor 10/24/2015   History of  hysterectomy for benign disease 10/24/2015    Family History  Problem Relation Age of Onset   Alcohol abuse Father    Heart attack Father    Cancer Sister        LUNG   Depression Paternal Grandfather    Dementia Mother    Stroke Mother     Social History   Tobacco Use  Smoking Status Former   Pack years: 0.00   Types: Cigarettes   Quit date: 1995   Years since quitting: 27.4  Smokeless Tobacco Never    Past Surgical History:  Procedure Laterality Date   ABDOMINAL HYSTERECTOMY     ANTERIOR AND POSTERIOR REPAIR N/A 02/09/2019   Procedure: ANTERIOR (CYSTOCELE) AND POSTERIOR REPAIR (RECTOCELE);  Surgeon: Bjorn Loser, MD;  Location: [facility];  Service: Urology;  Laterality: N/A;   bladder tack     BREAST CYST EXCISION     CHOLECYSTECTOMY     CYSTOSCOPY N/A 02/09/2019   Procedure: CYSTOSCOPY;  Surgeon: Bjorn Loser, MD;  Location: [facility];  Service: Urology;  Laterality: N/A;   SKIN CANCER EXCISION N/A    from back    Immunization History  Administered Date(s) Administered   Moderna Sars-Covid-2 Vaccination 05/03/2019, 06/01/2019, 03/08/2020   Tdap 02/08/2006, 11/01/2016    Recent Results (from the past 2160 hour(s))  Urine Culture     Status: None   Collection Time: 05/24/20  1:09 PM   Specimen: Urine  Result Value Ref Range   MICRO NUMBER: [id number]    SPECIMEN QUALITY: Adequate    Sample Source URINE    STATUS: FINAL    Result:      Less than 10,000 CFU/mL of single Gram negative organism isolated. No further testing will be performed. If clinically indicated, recollection using a method to minimize contamination, with prompt transfer to Urine Culture Transport Tube, is recommended.  POCT URINALYSIS DIP (CLINITEK)     Status: Normal   Collection Time: 05/24/20  1:15 PM  Result Value Ref Range   Color, UA yellow yellow   Clarity, UA clear clear   Glucose, UA negative negative mg/dL   Bilirubin, UA negative negative   Ketones, POC UA negative negative  mg/dL   Spec Grav, UA 1.015 1.010 - 1.025   Blood, UA negative negative   pH, UA 5.5 5.0 - 8.0   POC PROTEIN,UA negative negative, trace   Urobilinogen, UA 0.2 0.2 or 1.0 E.U./dL   Nitrite, UA Negative Negative   Leukocytes, UA Negative Negative  Lipid Panel w/reflex Direct LDL     Status: None   Collection Time: 06/15/20 12:00 AM  Result Value Ref Range   Cholesterol 179 <200 mg/dL   HDL 82 > OR = 50 mg/dL   Triglycerides 113 <150 mg/dL   LDL Cholesterol (Calc)  77 mg/dL (calc)    Comment: Reference range: <100 . Desirable range <100 mg/dL for primary prevention;   <70 mg/dL for patients with CHD or diabetic patients  with > or = 2 CHD risk factors. Marland Kitchen LDL-C is now calculated using the Martin-Hopkins  calculation, which is a validated novel method providing  better accuracy than the Friedewald equation in the  estimation of LDL-C.  Cresenciano Genre et al. Annamaria Helling. 7510;258(52): 2061-2068  (http://education.[facility].com/faq/FAQ164)    Total CHOL/HDL Ratio 2.2 <5.0 (calc)   Non-HDL Cholesterol (Calc) 97 <130 mg/dL (calc)    Comment: For patients with diabetes plus 1 major ASCVD risk  factor, treating to a non-HDL-C goal of <100 mg/dL  (LDL-C of <70 mg/dL) is considered a therapeutic  option.   COMPLETE METABOLIC PANEL WITH GFR     Status: Abnormal   Collection Time: 06/15/20 12:00 AM  Result Value Ref Range   Glucose, Bld 102 (H) 65 - 99 mg/dL    Comment: .            Fasting reference interval . For someone without known diabetes, a glucose value between 100 and 125 mg/dL is consistent with prediabetes and should be confirmed with a follow-up test. .    BUN 10 7 - 25 mg/dL   Creat 0.68 0.50 - 0.99 mg/dL    Comment: For patients >80 years of age, the reference limit for Creatinine is approximately 13% higher for people identified as African-American. .    GFR, Est Non African American 89 > OR = 60 mL/min/1.42m2   GFR, Est African American 103 > OR = 60 mL/min/1.29m2    BUN/Creatinine Ratio NOT APPLICABLE 6 - 22 (calc)   Sodium 139 135 - 146 mmol/L   Potassium 4.1 3.5 - 5.3 mmol/L   Chloride 104 98 - 110 mmol/L   CO2 26 20 - 32 mmol/L   Calcium 9.6 8.6 - 10.4 mg/dL   Total Protein 6.8 6.1 - 8.1 g/dL   Albumin 4.5 3.6 - 5.1 g/dL   Globulin 2.3 1.9 - 3.7 g/dL (calc)   AG Ratio 2.0 1.0 - 2.5 (calc)   Total Bilirubin 0.8 0.2 - 1.2 mg/dL   Alkaline phosphatase (APISO) 74 37 - 153 U/L   AST 20 10 - 35 U/L   ALT 13 6 - 29 U/L  CBC w/Diff/Platelet     Status: Abnormal   Collection Time: 06/15/20 12:00 AM  Result Value Ref Range   WBC 6.5 3.8 - 10.8 Thousand/uL   RBC 4.97 3.80 - 5.10 Million/uL   Hemoglobin 13.4 11.7 - 15.5 g/dL   HCT 42.1 35.0 - 45.0 %   MCV 84.7 80.0 - 100.0 fL   MCH 27.0 27.0 - 33.0 pg   MCHC 31.8 (L) 32.0 - 36.0 g/dL   RDW 13.1 11.0 - 15.0 %   Platelets 202 140 - 400 Thousand/uL   MPV 9.8 7.5 - 12.5 fL   Neutro Abs 4,355 1,500 - 7,800 cells/uL   Lymphs Abs 1,495 850 - 3,900 cells/uL   Absolute Monocytes 442 200 - 950 cells/uL   Eosinophils Absolute 169 15 - 500 cells/uL   Basophils Absolute 39 0 - 200 cells/uL   Neutrophils Relative % 67 %   Total Lymphocyte 23.0 %   Monocytes Relative 6.8 %   Eosinophils Relative 2.6 %   Basophils Relative 0.6 %    No results found.     All questions at time of visit were answered - patient instructed to  contact office with any additional concerns or updates. ER/RTC precautions were reviewed with the patient as applicable.   Please note: manual typing as well as voice recognition software may have been used to produce this document - typos may escape review. Please contact Dr. Sheppard Coil for any needed clarifications.

## 2020-08-21 NOTE — Patient Instructions (Signed)
Ear pain: eustachian tube dysfunction (see printed info) Rx sent for steroids, nasal spay, can use OTC antihistamine + decongestant  BP: goal <140 top number, check BP at home Will call next week to see how you're doing If worse/change let me know If ear no better w/ above treatment will refer to ENT

## 2020-09-06 ENCOUNTER — Encounter: Payer: Self-pay | Admitting: Osteopathic Medicine

## 2020-09-07 ENCOUNTER — Other Ambulatory Visit: Payer: Self-pay | Admitting: Osteopathic Medicine

## 2020-09-07 DIAGNOSIS — E785 Hyperlipidemia, unspecified: Secondary | ICD-10-CM

## 2020-09-12 MED ORDER — CICLOPIROX 8 % EX SOLN
Freq: Every day | CUTANEOUS | 11 refills | Status: DC
Start: 2020-09-12 — End: 2022-04-16

## 2020-09-13 MED ORDER — VALSARTAN 40 MG PO TABS: 40.0000 mg | ORAL_TABLET | Freq: Every day | ORAL | 0 refills | Status: DC

## 2020-09-13 NOTE — Addendum Note (Signed)
Addended by: Maryla Morrow on: 09/13/2020 10:39 AM   Modules accepted: Orders

## 2020-09-18 ENCOUNTER — Other Ambulatory Visit: Payer: Self-pay | Admitting: Osteopathic Medicine

## 2020-09-19 ENCOUNTER — Ambulatory Visit (INDEPENDENT_AMBULATORY_CARE_PROVIDER_SITE_OTHER): Payer: Medicare HMO | Admitting: Sports Medicine

## 2020-09-19 ENCOUNTER — Other Ambulatory Visit: Payer: Self-pay

## 2020-09-19 ENCOUNTER — Ambulatory Visit (INDEPENDENT_AMBULATORY_CARE_PROVIDER_SITE_OTHER): Payer: Medicare HMO

## 2020-09-19 DIAGNOSIS — M5136 Other intervertebral disc degeneration, lumbar region: Secondary | ICD-10-CM

## 2020-09-19 DIAGNOSIS — M549 Dorsalgia, unspecified: Secondary | ICD-10-CM | POA: Diagnosis not present

## 2020-09-19 DIAGNOSIS — M51369 Other intervertebral disc degeneration, lumbar region without mention of lumbar back pain or lower extremity pain: Secondary | ICD-10-CM

## 2020-09-19 DIAGNOSIS — M545 Low back pain, unspecified: Secondary | ICD-10-CM | POA: Diagnosis not present

## 2020-09-19 MED ORDER — PREDNISONE 50 MG PO TABS: ORAL_TABLET | ORAL | 0 refills | Status: DC

## 2020-09-19 NOTE — Progress Notes (Signed)
    Procedures performed today:    None.  Independent interpretation of notes and tests performed by another provider:   None.  Brief History, Exam, Impression, and Recommendations:    Lumbar degenerative disc disease This is a very pleasant 70 year old female, yesterday she was doing some aerobics and felt pain in her left low back, nothing radicular, worse with sitting, flexion, Valsalva. No other red flag symptoms. We will start with a burst of prednisone, x-rays, formal PT per Turn to see me in 4 weeks, hold off on additional aerobics classes until sometime next week/feeling better.    ___________________________________________ Gwen Her. Dianah Field, M.D., ABFM., CAQSM. Primary Care and [place] Instructor of [place] [facility]

## 2020-09-19 NOTE — Assessment & Plan Note (Signed)
This is a very pleasant 70 year old female, yesterday she was doing some aerobics and felt pain in her left low back, nothing radicular, worse with sitting, flexion, Valsalva. No other red flag symptoms. We will start with a burst of prednisone, x-rays, formal PT per Turn to see me in 4 weeks, hold off on additional aerobics classes until sometime next week/feeling better.

## 2020-09-20 ENCOUNTER — Encounter: Payer: Self-pay | Admitting: Physical Therapy

## 2020-09-20 ENCOUNTER — Ambulatory Visit: Payer: Medicare HMO | Admitting: Physical Therapy

## 2020-09-20 DIAGNOSIS — R29898 Other symptoms and signs involving the musculoskeletal system: Secondary | ICD-10-CM

## 2020-09-20 DIAGNOSIS — M545 Low back pain, unspecified: Secondary | ICD-10-CM | POA: Diagnosis not present

## 2020-09-20 DIAGNOSIS — M6281 Muscle weakness (generalized): Secondary | ICD-10-CM

## 2020-09-20 NOTE — Therapy (Signed)
[place], [place], [address] Phone: [phone]   Fax:  [phone]  Physical Therapy Evaluation  Patient Details  Name: DAPHANIE OQUENDO MRN: [id number] Date of Birth: Dec 08, 1950 Referring Provider (PT): Dianah Field   Encounter Date: 09/20/2020   PT End of Session - 09/20/20 1517     Visit Number 1    Number of Visits 6    Date for PT Re-Evaluation 11/01/20    PT Start Time 0867    PT Stop Time 1425    PT Time Calculation (min) 40 min    Activity Tolerance Patient tolerated treatment well    Behavior During Therapy [facility] for tasks assessed/performed             Past Medical History:  Diagnosis Date   Generalized anxiety disorder 05/24/2016   GERD (gastroesophageal reflux disease)    History of benign essential tremor 10/24/2015   Panic attacks 03/15/2020   Panic attacks    Skin cancer    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   Thyroid activity decreased 11/10/2015    Past Surgical History:  Procedure Laterality Date   ABDOMINAL HYSTERECTOMY     ANTERIOR AND POSTERIOR REPAIR N/A 02/09/2019   Procedure: ANTERIOR (CYSTOCELE) AND POSTERIOR REPAIR (RECTOCELE);  Surgeon: Bjorn Loser, MD;  Location: [facility];  Service: Urology;  Laterality: N/A;   bladder tack     BREAST CYST EXCISION     CHOLECYSTECTOMY     CYSTOSCOPY N/A 02/09/2019   Procedure: CYSTOSCOPY;  Surgeon: Bjorn Loser, MD;  Location: [facility];  Service: Urology;  Laterality: N/A;   SKIN CANCER EXCISION N/A    from back    There were no vitals filed for this visit.    Subjective Assessment - 09/20/20 1352     Subjective Pt was participating  in senior aerobics class on Monday and strained her back. Pt went to MD who prescribed prednisone and physical therapy. Pt injured her back in a similar way last year and was unable to get out of bed for a few days.  Pain increases when sitting on compliant surfaces like her bed, prolonged walking,  bending, squatting. Pain eases with rest and meds and heat.    Limitations Walking    How long can you walk comfortably? 30 minutes    Patient Stated Goals decrease pain to return to aerobics    Currently in Pain? Yes    Pain Score 1     Pain Location Back    Pain Orientation Lower;Left    Pain Descriptors / Indicators Aching    Pain Type Acute pain    Pain Onset In the past 7 days    Pain Frequency Intermittent    Aggravating Factors  bend, squat, prolonged walking    Pain Relieving Factors heat, meds                [facility] PT Assessment - 09/20/20 0001       Assessment   Medical Diagnosis lumbar DDD    Referring Provider (PT) Thekkekandam      Precautions   Precaution Comments LATEX ALLERGY      Balance Screen   Has the patient fallen in the past 6 months Yes    How many times? 1    Has the patient had a decrease in activity level because of a fear of falling?  No    Is the patient reluctant to leave their home because of a fear of falling?  No      Prior Function   Level of Independence Independent      Observation/Other Assessments   Focus on Therapeutic Outcomes (FOTO)  60      ROM / Strength   AROM / PROM / Strength AROM;Strength      AROM   AROM Assessment Site Lumbar    Lumbar Flexion WFL - pain end range    Lumbar Extension limited 50%    Lumbar - Right Side Bend WFL    Lumbar - Left Side Bend limited 50% pain end range    Lumbar - Right Rotation WFL    Lumbar - Left Rotation [facility]      Strength   Strength Assessment Site Hip    Right/Left Hip Right;Left    Right Hip Flexion 3/5    Right Hip Extension 3/5    Right Hip ABduction 3+/5    Left Hip Flexion 3/5    Left Hip Extension 3/5    Left Hip ABduction 3+/5      Palpation   Spinal mobility hypomobile lumbar spine and SIJ    Palpation comment TTP Lt lumbar paraspinals, QL, piriformis                        Objective measurements completed on examination: See above findings.        [place] Adult PT Treatment/Exercise - 09/20/20 0001       Exercises   Exercises Lumbar      Lumbar Exercises: Stretches   Single Knee to Chest Stretch 30 seconds    Lower Trunk Rotation Limitations 10 reps in pain free range      Lumbar Exercises: Standing   Other Standing Lumbar Exercises pallof press red TB x 10 bilat      Lumbar Exercises: Supine   Pelvic Tilt 5 reps;5 seconds    Pelvic Tilt Limitations then with march x 5 bilat                    PT Education - 09/20/20 1517     Education Details PT POC and goals, HEP    Person(s) Educated Patient    Methods Explanation;Demonstration;Handout    Comprehension Returned demonstration;Verbalized understanding                 PT Long Term Goals - 09/20/20 1552       PT LONG TERM GOAL #1   Title Pt will be independent with HEP    Time 6    Period Weeks    Status New    Target Date 11/01/20      PT LONG TERM GOAL #2   Title Pt will improve FOTO to >= 86 to demo improved functional mobility    Time 6    Period Weeks    Status New    Target Date 11/01/20      PT LONG TERM GOAL #3   Title Pt will improve bilat LE strength to 4+/5 to return to aerobics with decreased pain    Time 6    Period Weeks    Status New    Target Date 11/01/20      PT LONG TERM GOAL #4   Title Pt will improve Left lumbar sidebending ROM to [facility] to return to aerobics with decreased pain    Time 6    Period Weeks    Status New    Target Date 11/01/20  Plan - 09/20/20 1518     Clinical Impression Statement Pt is a 70 y/o female referred for lumbar DDD. Pt presents with increased pain and mm spasticity, decreased strength, decreased ROM and decreased activity tolerance. Pt will benefit from skilled PT to address deficits and improve functional mobility.    Personal Factors and Comorbidities Age;Past/Current Experience;Time since onset of injury/illness/exacerbation     Examination-Activity Limitations Squat;Stairs;Lift;Bend    Examination-Participation Restrictions Community Activity;Shop;Cleaning    Stability/Clinical Decision Making Stable/Uncomplicated    Clinical Decision Making Low    Rehab Potential Good    PT Frequency 1x / week    PT Duration 6 weeks    PT Treatment/Interventions Aquatic Therapy;Iontophoresis 4mg /ml Dexamethasone;Electrical Stimulation;Moist Heat;Cryotherapy;Traction;Therapeutic exercise;Balance training;Neuromuscular re-education;Therapeutic activities;Patient/family education;Taping;Manual techniques;Passive range of motion;Dry needling    PT Next Visit Plan assess HEP, progress core strength    PT Home Exercise Plan [email or web address]    Consulted and Agree with Plan of Care Patient             Patient will benefit from skilled therapeutic intervention in order to improve the following deficits and impairments:  Pain, Impaired flexibility, Decreased strength, Decreased activity tolerance, Decreased range of motion, Increased muscle spasms, Hypomobility  Visit Diagnosis: Acute left-sided low back pain without sciatica - Plan: PT plan of care cert/re-cert  Muscle weakness (generalized) - Plan: PT plan of care cert/re-cert  Other symptoms and signs involving the musculoskeletal system - Plan: PT plan of care cert/re-cert     Problem List Patient Active Problem List   Diagnosis Date Noted   Lumbar degenerative disc disease 09/19/2020   Cystocele with prolapse 02/09/2019   Postmenopausal atrophic vaginitis 08/11/2018   Prolapse of anterior vaginal wall 08/11/2018   Posterior vaginal wall prolapse 08/11/2018   Onychomycosis 11/01/2016   History of chronic eczema 11/01/2016   Generalized anxiety disorder 05/24/2016   S/P colonoscopy 12/20/2015   Thyroid activity decreased 11/10/2015   Relationship problem between parent and child 10/24/2015   Annual physical exam 10/24/2015   History of benign essential tremor 10/24/2015    History of hysterectomy for benign disease 10/24/2015  Isabelle Course, PT   Omauri Boeve 09/20/2020, 3:56 PM  [place], [place], [address] Phone: [phone]   Fax:  [phone]  Name: LANEY LOUDERBACK MRN: [id number] Date of Birth: 1950/07/20

## 2020-09-20 NOTE — Patient Instructions (Signed)
Access Code: [email or web address] URL: [email or web address] Date: 09/20/2020 Prepared by: Isabelle Course  Exercises Supine Bridge - 1 x daily - 7 x weekly - 2 sets - 10 reps Supine Posterior Pelvic Tilt - 1 x daily - 7 x weekly - 2 sets - 10 reps - 3-5 seconds hold Supine March with Posterior Pelvic Tilt - 1 x daily - 7 x weekly - 2 sets - 10 reps Supine Lower Trunk Rotation - 1 x daily - 7 x weekly - 2 sets - 10 reps Hooklying Single Knee to Chest Stretch - 1 x daily - 7 x weekly - 3 sets - 1 reps - 20-30 seconds hold Standing Anti-Rotation Press with Anchored Resistance - 1 x daily - 7 x weekly - 2 sets - 10 reps

## 2020-09-28 ENCOUNTER — Other Ambulatory Visit: Payer: Self-pay

## 2020-09-28 ENCOUNTER — Ambulatory Visit: Payer: Medicare HMO | Admitting: Physical Therapy

## 2020-09-28 DIAGNOSIS — M6281 Muscle weakness (generalized): Secondary | ICD-10-CM | POA: Diagnosis not present

## 2020-09-28 DIAGNOSIS — M545 Low back pain, unspecified: Secondary | ICD-10-CM | POA: Diagnosis not present

## 2020-09-28 DIAGNOSIS — R29898 Other symptoms and signs involving the musculoskeletal system: Secondary | ICD-10-CM | POA: Diagnosis not present

## 2020-09-28 NOTE — Therapy (Addendum)
[place], [place], [address] Phone: [phone]   Fax:  [phone]  Physical Therapy Treatment and Discharge  Patient Details  Name: Natasha Wilson MRN: [id number] Date of Birth: [birth year] Referring Provider (PT): Dianah Field   Encounter Date: 09/28/2020   PT End of Session - 09/28/20 1403     Visit Number 2    Number of Visits 6    Date for PT Re-Evaluation 11/01/20    PT Start Time 1400    PT Stop Time 1440    PT Time Calculation (min) 40 min    Activity Tolerance Patient tolerated treatment well    Behavior During Therapy [facility] for tasks assessed/performed             Past Medical History:  Diagnosis Date   Generalized anxiety disorder 05/24/2016   GERD (gastroesophageal reflux disease)    History of benign essential tremor 10/24/2015   Panic attacks 03/15/2020   Panic attacks    Skin cancer    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   Thyroid activity decreased 11/10/2015    Past Surgical History:  Procedure Laterality Date   ABDOMINAL HYSTERECTOMY     ANTERIOR AND POSTERIOR REPAIR N/A 02/09/2019   Procedure: ANTERIOR (CYSTOCELE) AND POSTERIOR REPAIR (RECTOCELE);  Surgeon: Bjorn Loser, MD;  Location: [facility];  Service: Urology;  Laterality: N/A;   bladder tack     BREAST CYST EXCISION     CHOLECYSTECTOMY     CYSTOSCOPY N/A 02/09/2019   Procedure: CYSTOSCOPY;  Surgeon: Bjorn Loser, MD;  Location: [facility];  Service: Urology;  Laterality: N/A;   SKIN CANCER EXCISION N/A    from back    There were no vitals filed for this visit.   Subjective Assessment - 09/28/20 1410     Subjective Pt reports she has felt much better than last week.  she has returned to aerobics without any difficulty.    Patient Stated Goals decrease pain to return to aerobics    Currently in Pain? No/denies    Pain Score 0-No pain    Pain Location Back                [facility] PT Assessment - 09/28/20 0001        Assessment   Medical Diagnosis lumbar DDD    Referring Provider (PT) Thekkekandam      Observation/Other Assessments   Focus on Therapeutic Outcomes (FOTO)  functional score 83      AROM   AROM Assessment Site --   pain free today   Lumbar Flexion WNL    Lumbar Extension WNL    Lumbar - Right Side Bend WNL    Lumbar - Left Side Bend WNL    Lumbar - Right Rotation WNL    Lumbar - Left Rotation WNL      Strength   Right/Left Hip Right;Left    Right Hip Flexion 4+/5    Right Hip Extension 4+/5    Right Hip ABduction 4+/5    Left Hip Flexion 5/5    Left Hip Extension 5/5    Left Hip ABduction 4+/5             [facility] Adult PT Treatment/Exercise - 09/28/20 0001       Lumbar Exercises: Stretches   Passive Hamstring Stretch Right;Left;2 reps;20 seconds    Single Knee to Chest Stretch Right;Left;2 reps;20 seconds    Lower Trunk Rotation 5 reps   arms in T- Y  Piriformis Stretch Right;Left;2 reps;20 seconds    Figure 4 Stretch 1 rep;20 seconds      Lumbar Exercises: Seated   Other Seated Lumbar Exercises abdominal bracing x 5 sec x 3.  then bracing with sit to/from stand x 8 reps    Other Seated Lumbar Exercises seated marching x 10 reps each side, with ab set      Lumbar Exercises: Supine   Bent Knee Raise 10 reps   with ab set   Bridge 10 reps;3 seconds                    PT Education - 09/28/20 1440     Education Details HEP updated.    Person(s) Educated Patient    Methods Explanation;Demonstration;Verbal cues;Handout    Comprehension Verbalized understanding;Returned demonstration                 PT Long Term Goals - 09/28/20 1441       PT LONG TERM GOAL #1   Title Pt will be independent with HEP    Time 6    Period Weeks    Status Achieved      PT LONG TERM GOAL #2   Title Pt will improve FOTO to >= 86 to demo improved functional mobility    Baseline 83 : 09/28/20, improved from 60.    Time 6    Period Weeks    Status Partially Met       PT LONG TERM GOAL #3   Title Pt will improve bilat LE strength to 4+/5 to return to aerobics with decreased pain    Time 6    Period Weeks    Status Achieved      PT LONG TERM GOAL #4   Title Pt will improve Left lumbar sidebending ROM to [facility] to return to aerobics with decreased pain    Time 6    Period Weeks    Status Achieved                   Plan - 09/28/20 1442     Clinical Impression Statement Pt tolerated all exercises well without any production of symptoms. Minimal cues required for core engagement and to breathe during exertion.  FOTO score improved to 83, partially met FOTO goal.  Pt has met most of goals and verbalized readiness to d/c at this time.    Personal Factors and Comorbidities Age;Past/Current Experience;Time since onset of injury/illness/exacerbation    Examination-Activity Limitations Squat;Stairs;Lift;Bend    Examination-Participation Restrictions Community Activity;Shop;Cleaning    Stability/Clinical Decision Making Stable/Uncomplicated    Rehab Potential Good    PT Frequency 1x / week    PT Duration 6 weeks    PT Treatment/Interventions Aquatic Therapy;Iontophoresis 41m/ml Dexamethasone;Electrical Stimulation;Moist Heat;Cryotherapy;Traction;Therapeutic exercise;Balance training;Neuromuscular re-education;Therapeutic activities;Patient/family education;Taping;Manual techniques;Passive range of motion;Dry needling    PT Next Visit Plan d/c per pt request.    [place] Agree with Plan of Care Patient             Patient will benefit from skilled therapeutic intervention in order to improve the following deficits and impairments:  Pain, Impaired flexibility, Decreased strength, Decreased activity tolerance, Decreased range of motion, Increased muscle spasms, Hypomobility  Visit Diagnosis: Acute left-sided low back pain without sciatica  Muscle weakness (generalized)  Other symptoms and signs  involving the musculoskeletal system     Problem List Patient Active Problem List   Diagnosis Date Noted  Lumbar degenerative disc disease 09/19/2020   Cystocele with prolapse 02/09/2019   Postmenopausal atrophic vaginitis 08/11/2018   Prolapse of anterior vaginal wall 08/11/2018   Posterior vaginal wall prolapse 08/11/2018   Onychomycosis 11/01/2016   History of chronic eczema 11/01/2016   Generalized anxiety disorder 05/24/2016   S/P colonoscopy 12/20/2015   Thyroid activity decreased 11/10/2015   Relationship problem between parent and child 10/24/2015   Annual physical exam 10/24/2015   History of benign essential tremor 10/24/2015   History of hysterectomy for benign disease 10/24/2015  PHYSICAL THERAPY DISCHARGE SUMMARY  Visits from Start of Care: 2  Current functional level related to goals / functional outcomes: Decreased pain   Remaining deficits: See above   Education / Equipment: HEP   Patient agrees to discharge. Patient goals were met. Patient is being discharged due to being pleased with the current functional level. Isabelle Course, PT,DPT07/29/2212:33 PM   Kerin Perna, PTA 09/28/20 2:45 PM   [place], [place], [address] Phone: [phone]   Fax:  [phone]  Name: WILLADEAN GUYTON MRN: [id number] Date of Birth: April 20, 1950

## 2020-09-28 NOTE — Patient Instructions (Signed)
Access Code: [email or web address] URL: [email or web address] Date: 09/28/2020 Prepared by: [facility]  Exercises Supine Bridge - 1 x daily - 7 x weekly - 2 sets - 10 reps Supine Lower Trunk Rotation - 1 x daily - 7 x weekly - 2 sets - 10 reps Hooklying Single Knee to Chest Stretch - 1 x daily - 7 x weekly - 3 sets - 1 reps - 20-30 seconds hold Seated Hamstring Stretch - 1-2 x daily - 7 x weekly - 1 sets - 2 reps - 20 seconds hold Seated Figure 4 Piriformis Stretch - 1-2 x daily - 7 x weekly - 1 sets - 2 reps - 20 seconds. hold Seated Transversus Abdominis Bracing - 1 x daily - 7 x weekly - 1 sets - 10 reps - 10 hold

## 2020-10-18 ENCOUNTER — Telehealth (INDEPENDENT_AMBULATORY_CARE_PROVIDER_SITE_OTHER): Payer: Medicare HMO | Admitting: Physician Assistant

## 2020-10-18 VITALS — BP 141/62 | Ht 63.0 in | Wt 153.0 lb

## 2020-10-18 DIAGNOSIS — L209 Atopic dermatitis, unspecified: Secondary | ICD-10-CM | POA: Insufficient documentation

## 2020-10-18 DIAGNOSIS — R21 Rash and other nonspecific skin eruption: Secondary | ICD-10-CM | POA: Diagnosis not present

## 2020-10-18 NOTE — Progress Notes (Signed)
Facial redness - started 2 months ago after prednisone use, happens every day but gets worse some days Eczema diagnosis a long time ago, now rash around mouth, down neck, hands Using gold bond lotion Has appt with dermatology next week

## 2020-10-20 ENCOUNTER — Other Ambulatory Visit: Payer: Self-pay | Admitting: Osteopathic Medicine

## 2020-10-20 ENCOUNTER — Encounter: Payer: Self-pay | Admitting: Physician Assistant

## 2020-10-20 NOTE — Progress Notes (Signed)
..  Virtual Visit via Video Note  I connected with Natasha Wilson on 10/18/2020 at 10:50 AM EDT by a video enabled telemedicine application and verified that I am speaking with the correct person using two identifiers.  Location: Patient: home Provider: clinic  .Marland KitchenParticipating in visit:  Patient: Natasha Wilson Provider: Iran Planas PA-C   I discussed the limitations of evaluation and management by telemedicine and the availability of in person appointments. The patient expressed understanding and agreed to proceed.  History of Present Illness: Pt is a 70 yo female who calls in to the clinic to discuss irritating rash of face, arms, hands. She has a hx of atopic dermatitis but has not been an issue in a while. Noticed facial bumps and redness around mouth, chin, neck about 2 months ago after taking prednisone. Has not gone away. Rash is not painful but irritating. She admits to dry cracked hands. She is using gold bond lotion.   .. Active Ambulatory Problems    Diagnosis Date Noted   Relationship problem between parent and child 10/24/2015   Annual physical exam 10/24/2015   History of benign essential tremor 10/24/2015   History of hysterectomy for benign disease 10/24/2015   Thyroid activity decreased 11/10/2015   S/P colonoscopy 12/20/2015   Generalized anxiety disorder 05/24/2016   Onychomycosis 11/01/2016   History of chronic eczema 11/01/2016   Postmenopausal atrophic vaginitis 08/11/2018   Prolapse of anterior vaginal wall 08/11/2018   Posterior vaginal wall prolapse 08/11/2018   Cystocele with prolapse 02/09/2019   Lumbar degenerative disc disease 09/19/2020   Atopic dermatitis 10/18/2020   Resolved Ambulatory Problems    Diagnosis Date Noted   No Resolved Ambulatory Problems   Past Medical History:  Diagnosis Date   GERD (gastroesophageal reflux disease)    Panic attacks 03/15/2020   Panic attacks    Skin cancer     Observations/Objective: No acute distress Normal mood  and appearance Rash is not easily seen. Appears to be more maculopapular around mouth and very scattered down arms but more dry cracked skin bilateral hands.   .. Today's Vitals   10/18/20 1041  BP: (!) 141/62  Weight: 153 lb (69.4 kg)  Height: '5\' 3"'$  (1.6 m)   Body mass index is 27.1 kg/m.    Assessment and Plan: Marland KitchenMarland KitchenBrecken was seen today for follow-up.  Diagnoses and all orders for this visit:  Rash and nonspecific skin eruption  Unclear etiology. I don't feel like it is an allergic reaction to prednisone but discussed with patient when she comes off prednisone which reduces inflammation and allergic responses that might be why her symptoms do come back. Hx of atopic dermatitis and her hands do present like that.  She has topical steroid at home. Try that for next week. Keep dermatology appt. Make sure taking zyrtec daily. Keep skin moisturized.    Follow Up Instructions:    I discussed the assessment and treatment plan with the patient. The patient was provided an opportunity to ask questions and all were answered. The patient agreed with the plan and demonstrated an understanding of the instructions.   The patient was advised to call back or seek an in-person evaluation if the symptoms worsen or if the condition fails to improve as anticipated.    Iran Planas, PA-C

## 2020-10-25 DIAGNOSIS — D485 Neoplasm of uncertain behavior of skin: Secondary | ICD-10-CM | POA: Diagnosis not present

## 2020-10-25 DIAGNOSIS — D1801 Hemangioma of skin and subcutaneous tissue: Secondary | ICD-10-CM | POA: Diagnosis not present

## 2020-10-25 DIAGNOSIS — L209 Atopic dermatitis, unspecified: Secondary | ICD-10-CM | POA: Diagnosis not present

## 2020-10-25 DIAGNOSIS — L578 Other skin changes due to chronic exposure to nonionizing radiation: Secondary | ICD-10-CM | POA: Diagnosis not present

## 2020-10-26 DIAGNOSIS — H2513 Age-related nuclear cataract, bilateral: Secondary | ICD-10-CM | POA: Diagnosis not present

## 2020-10-26 DIAGNOSIS — H5213 Myopia, bilateral: Secondary | ICD-10-CM | POA: Diagnosis not present

## 2020-10-31 ENCOUNTER — Other Ambulatory Visit: Payer: Self-pay

## 2020-10-31 ENCOUNTER — Ambulatory Visit (INDEPENDENT_AMBULATORY_CARE_PROVIDER_SITE_OTHER): Payer: Medicare HMO | Admitting: Sports Medicine

## 2020-10-31 DIAGNOSIS — M5136 Other intervertebral disc degeneration, lumbar region: Secondary | ICD-10-CM

## 2020-10-31 DIAGNOSIS — M51369 Other intervertebral disc degeneration, lumbar region without mention of lumbar back pain or lower extremity pain: Secondary | ICD-10-CM

## 2020-10-31 NOTE — Assessment & Plan Note (Signed)
Pleasant 70 year old female, who we saw her for axial low back pain, she has had good resolution of symptoms with prednisone, formal physical therapy, she did have some steroid flushing which was normal, return as needed, in the future we may consider using Decadron rather than prednisone per her request.

## 2020-10-31 NOTE — Progress Notes (Signed)
    Procedures performed today:    None.  Independent interpretation of notes and tests performed by another provider:   None.  Brief History, Exam, Impression, and Recommendations:    Lumbar degenerative disc disease Pleasant 70 year old female, who we saw her for axial low back pain, she has had good resolution of symptoms with prednisone, formal physical therapy, she did have some steroid flushing which was normal, return as needed, in the future we may consider using Decadron rather than prednisone per her request.    ___________________________________________ Gwen Her. Dianah Field, M.D., ABFM., CAQSM. Primary Care and [place] Instructor of [employer] [facility] [employer]

## 2020-11-13 ENCOUNTER — Other Ambulatory Visit: Payer: Self-pay | Admitting: Medical-Surgical

## 2020-11-13 DIAGNOSIS — Z1231 Encounter for screening mammogram for malignant neoplasm of breast: Secondary | ICD-10-CM

## 2020-11-15 ENCOUNTER — Ambulatory Visit (INDEPENDENT_AMBULATORY_CARE_PROVIDER_SITE_OTHER): Payer: Medicare HMO | Admitting: Medical-Surgical

## 2020-11-15 ENCOUNTER — Encounter: Payer: Self-pay | Admitting: Medical-Surgical

## 2020-11-15 ENCOUNTER — Other Ambulatory Visit: Payer: Self-pay

## 2020-11-15 VITALS — BP 155/66 | HR 64 | Resp 20 | Wt 158.0 lb

## 2020-11-15 DIAGNOSIS — Z2821 Immunization not carried out because of patient refusal: Secondary | ICD-10-CM

## 2020-11-15 DIAGNOSIS — I10 Essential (primary) hypertension: Secondary | ICD-10-CM

## 2020-11-15 DIAGNOSIS — Z23 Encounter for immunization: Secondary | ICD-10-CM

## 2020-11-15 DIAGNOSIS — Z7689 Persons encountering health services in other specified circumstances: Secondary | ICD-10-CM

## 2020-11-15 NOTE — Progress Notes (Signed)
  HPI with pertinent ROS:   CC: Establish with new PCP  HPI: Very pleasant 70 year old female presenting to transfer care to a new PCP. She does have significant concerns about her blood pressure.  She has been monitoring this for a while with Dr. Sheppard Coil and previously attempted treatment with valsartan.  Unfortunately she had an allergy to valsartan with a rash that developed.  She stopped the valsartan and did not try another medication at that time.  She has been monitoring her blood pressures at home with results of the 0000000 systolically.  She has made some lifestyle modifications including reducing her use of salt at home and exercising weekly at least 3 times church and then walking on Saturday.  She does have increased stress at home which has not been helpful for her blood pressure.  She is doing counseling once a month but is not interested in a medication to help with mood management at this time. Denies CP, SOB, palpitations, lower extremity edema, dizziness, headaches, or vision changes.  I reviewed the past medical history, family history, social history, surgical history, and allergies today and no changes were needed.  Please see the problem list section below in epic for further details.   Physical exam:   General: Well Developed, well nourished, and in no acute distress.  Neuro: Alert and oriented x3.  HEENT: Normocephalic, atraumatic.  Skin: Warm and dry. Cardiac: Regular rate and rhythm, no murmurs rubs or gallops, no lower extremity edema.  Respiratory: Clear to auscultation bilaterally. Not using accessory muscles, speaking in full sentences.  Impression and Recommendations:    1. Encounter to establish care Reviewed available information and discussed care concerns with patient.   2. Essential hypertension Blood pressure is elevated outside of recommended goal.  She is willing to start a medication so sending in amlodipine 5 mg daily since her readings seem to  be more isolated systolic hypertension.  Reviewed recommendations for reducing salt in the diet and continued exercise.  Discussed stress management and its effect on blood pressure.  3. Influenza vaccine refused Discussed recommendation for influenza vaccination.  Patient declined today.  4. Need for shingles vaccine Declined today.  5. Need for pneumonia vaccine Declined today.   Return in about 2 weeks (around 11/29/2020) for nurse visit for BP check. ___________________________________________ Clearnce Sorrel, DNP, APRN, FNP-BC Primary Care and [place]

## 2020-11-16 ENCOUNTER — Encounter: Payer: Self-pay | Admitting: Medical-Surgical

## 2020-11-16 ENCOUNTER — Telehealth: Payer: Self-pay

## 2020-11-16 NOTE — Telephone Encounter (Signed)
Patient left msg stating there was supposed to be a medication called in to [facility] yesterday when she was in. It still has not been done. Based on the note, I presume she is talking about the amlodipine '5mg'$ .

## 2020-11-17 ENCOUNTER — Other Ambulatory Visit: Payer: Self-pay | Admitting: Medical-Surgical

## 2020-11-17 MED ORDER — AMLODIPINE BESYLATE 5 MG PO TABS: 5.0000 mg | ORAL_TABLET | Freq: Every day | ORAL | 0 refills | Status: DC

## 2020-11-17 NOTE — Telephone Encounter (Signed)
Was able to reach patient on mobile number and express apologies. Patient states she has already picked up the medication.

## 2020-11-17 NOTE — Telephone Encounter (Signed)
Attempted to reach patient by phone; no answer.

## 2020-11-20 ENCOUNTER — Other Ambulatory Visit: Payer: Self-pay | Admitting: Osteopathic Medicine

## 2020-11-21 ENCOUNTER — Encounter: Payer: Self-pay | Admitting: Medical-Surgical

## 2020-11-22 ENCOUNTER — Other Ambulatory Visit: Payer: Self-pay | Admitting: Osteopathic Medicine

## 2020-11-29 ENCOUNTER — Other Ambulatory Visit: Payer: Self-pay

## 2020-11-29 ENCOUNTER — Ambulatory Visit (INDEPENDENT_AMBULATORY_CARE_PROVIDER_SITE_OTHER): Payer: Medicare HMO | Admitting: Family Medicine

## 2020-11-29 VITALS — BP 124/54 | HR 68 | Temp 98.4°F | Wt 156.0 lb

## 2020-11-29 DIAGNOSIS — I10 Essential (primary) hypertension: Secondary | ICD-10-CM | POA: Diagnosis not present

## 2020-11-29 NOTE — Progress Notes (Signed)
Medical screening examination/treatment was performed by qualified clinical staff member and as supervising physician I was immediately available for consultation/collaboration. I have reviewed documentation and agree with assessment and plan.  Latasha Buczkowski, DO  

## 2020-11-29 NOTE — Progress Notes (Signed)
Patient is here for a blood pressure check. Denies trouble sleeping, palpitations, lightheadedness, dizziness, SOB, chest pain or medication problems. Patient advised to schedule a follow up appointment as requested.

## 2020-12-06 ENCOUNTER — Encounter: Payer: Self-pay | Admitting: Medical-Surgical

## 2020-12-11 ENCOUNTER — Encounter: Payer: Self-pay | Admitting: Medical-Surgical

## 2020-12-11 ENCOUNTER — Telehealth (INDEPENDENT_AMBULATORY_CARE_PROVIDER_SITE_OTHER): Payer: Medicare HMO | Admitting: Medical-Surgical

## 2020-12-11 VITALS — Ht 63.0 in | Wt 156.1 lb

## 2020-12-11 DIAGNOSIS — F418 Other specified anxiety disorders: Secondary | ICD-10-CM | POA: Diagnosis not present

## 2020-12-11 MED ORDER — CITALOPRAM HYDROBROMIDE 20 MG PO TABS: ORAL_TABLET | ORAL | 1 refills | Status: DC

## 2020-12-11 NOTE — Progress Notes (Signed)
PHQ 21 GAD 18

## 2020-12-11 NOTE — Progress Notes (Signed)
Virtual Visit via Video Note  I connected with Natasha Wilson on 12/11/20 at  1:20 PM EDT by a video enabled telemedicine application and verified that I am speaking with the correct person using two identifiers.   I discussed the limitations of evaluation and management by telemedicine and the availability of in person appointments. The patient expressed understanding and agreed to proceed.  Patient location: home Provider locations: office  Subjective:    CC: anxiety/depression  HPI: Pleasant 70 year old female presenting via [place] video visit to discuss depression and anxiety.  She has had a history of depression and anxiety in the past but has not been on medication for quite a number of years.  Previously took Celexa 20 mg daily for 2-1/2 years which worked well for her.  Has some life situations that have worsened her symptoms lately and notes that she is been oversleeping, irritable, nervous, jittery, and very unmotivated.  She is doing counseling approximately once monthly and has been with her counselor for 10 years or so.  Unfortunately, her counselor does not accept Medicare so she ends up paying out-of-pocket.  She would like to see her more frequently but cost is a factor.  Denies HI.  Admits to having thoughts previously about just overdosing with her prescribed medications but has no current plan or intention to do so.  Past medical history, Surgical history, Family history not pertinant except as noted below, Social history, Allergies, and medications have been entered into the medical record, reviewed, and corrections made.   Review of Systems: See HPI for pertinent positives and negatives.   Objective:    General: Speaking clearly in complete sentences without any shortness of breath.  Alert and oriented x3.  Normal judgment. No apparent acute distress.  Impression and Recommendations:    1. Anxiety with depression Start Celexa 10 mg daily for 8 days then increase to 20 mg  daily.  Continue counseling and increase frequency if financially feasible.  Patient contracted for safety with no intention or plan for self-harm.  I discussed the assessment and treatment plan with the patient. The patient was provided an opportunity to ask questions and all were answered. The patient agreed with the plan and demonstrated an understanding of the instructions.   The patient was advised to call back or seek an in-person evaluation if the symptoms worsen or if the condition fails to improve as anticipated.  20 minutes of non-face-to-face time was provided during this encounter.  Return in about 4 weeks (around 01/08/2021) for mood follow up.  Clearnce Sorrel, DNP, APRN, FNP-BC [place] [facility]

## 2020-12-14 ENCOUNTER — Encounter: Payer: Self-pay | Admitting: Medical-Surgical

## 2020-12-19 IMAGING — MG DIGITAL SCREENING BILAT W/ TOMO W/ CAD
6 of 12 series · 6 of 36 positions shown · non-contrast
Comparison: Previous exam(s).

CLINICAL DATA: Screening.

EXAM:
DIGITAL SCREENING BILATERAL MAMMOGRAM WITH TOMO AND CAD

[L XCCM synth-2D]
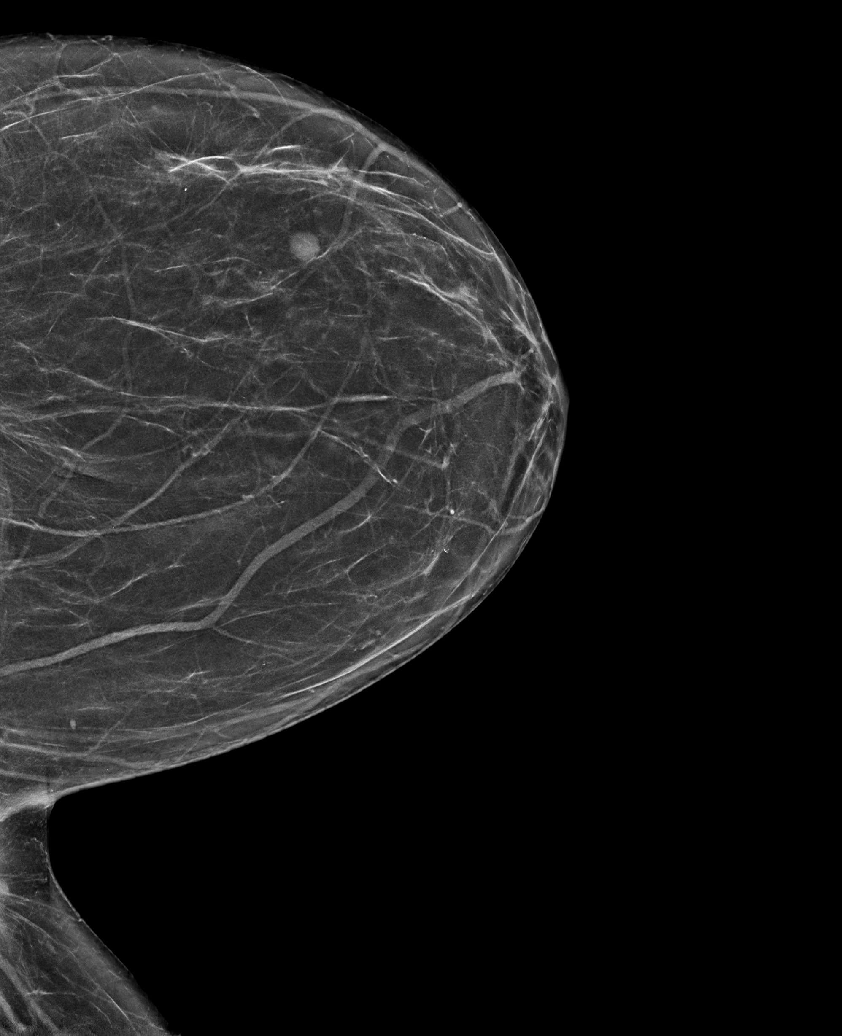

[R CC synth-2D]
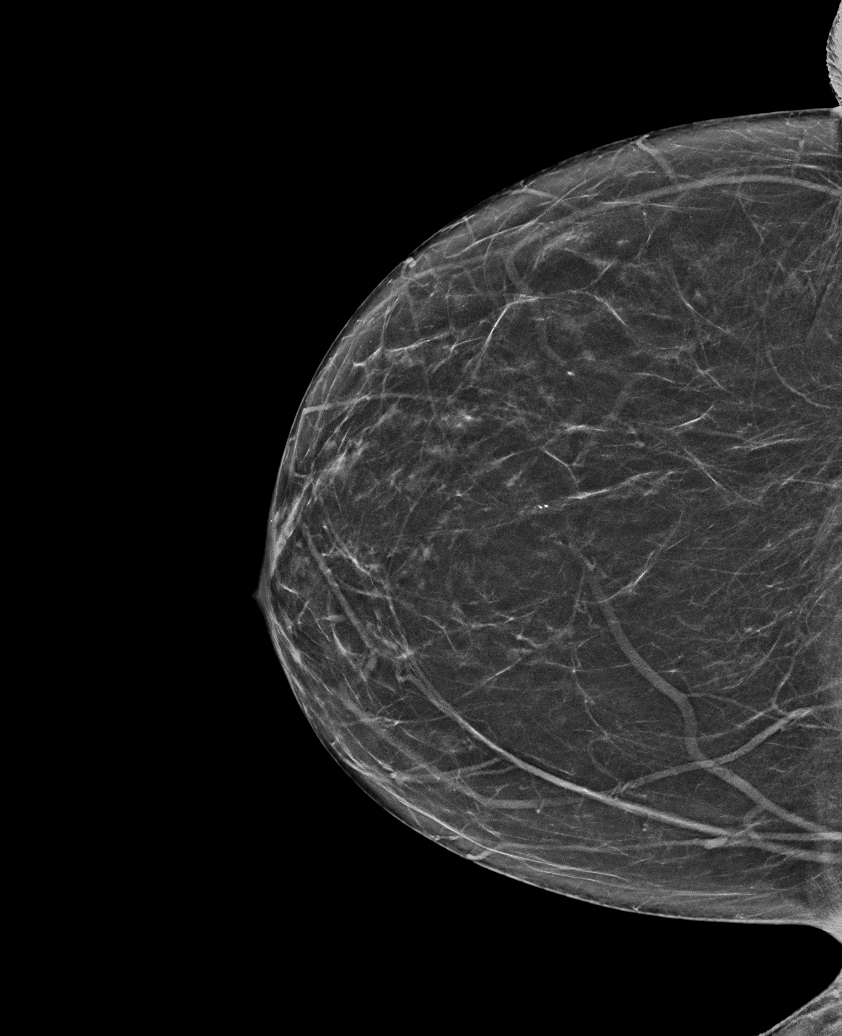

[R XCCL synth-2D]
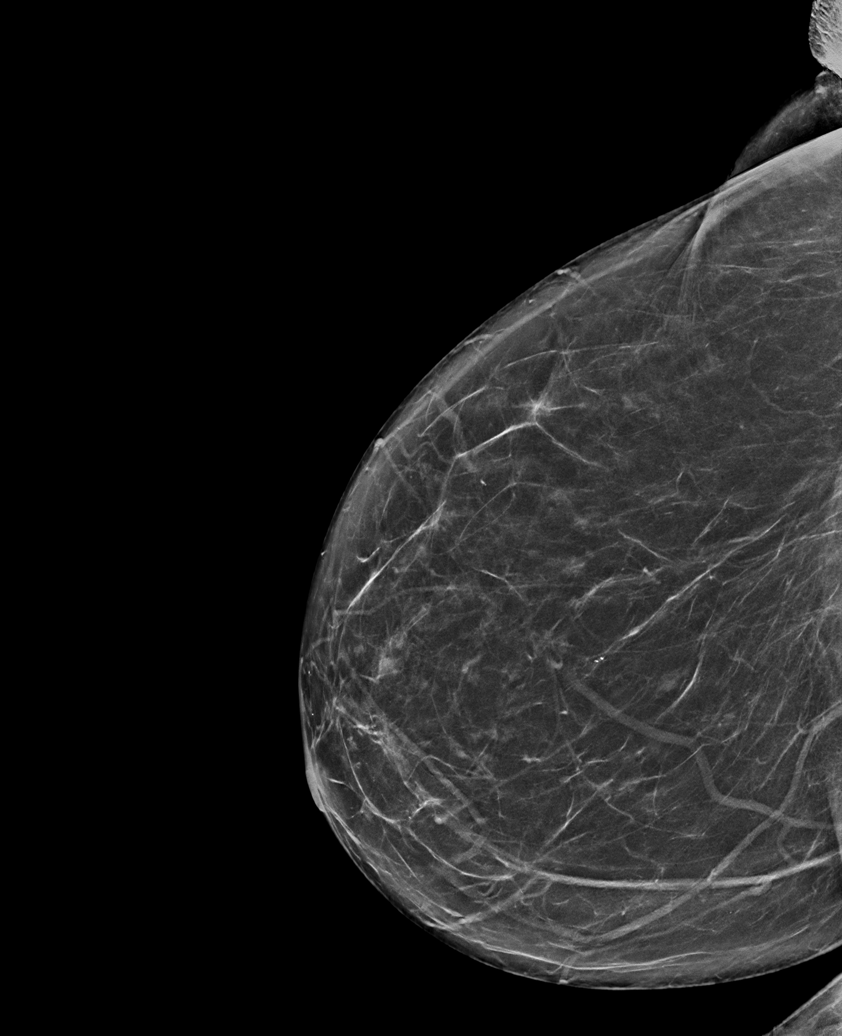

[R MLO synth-2D]
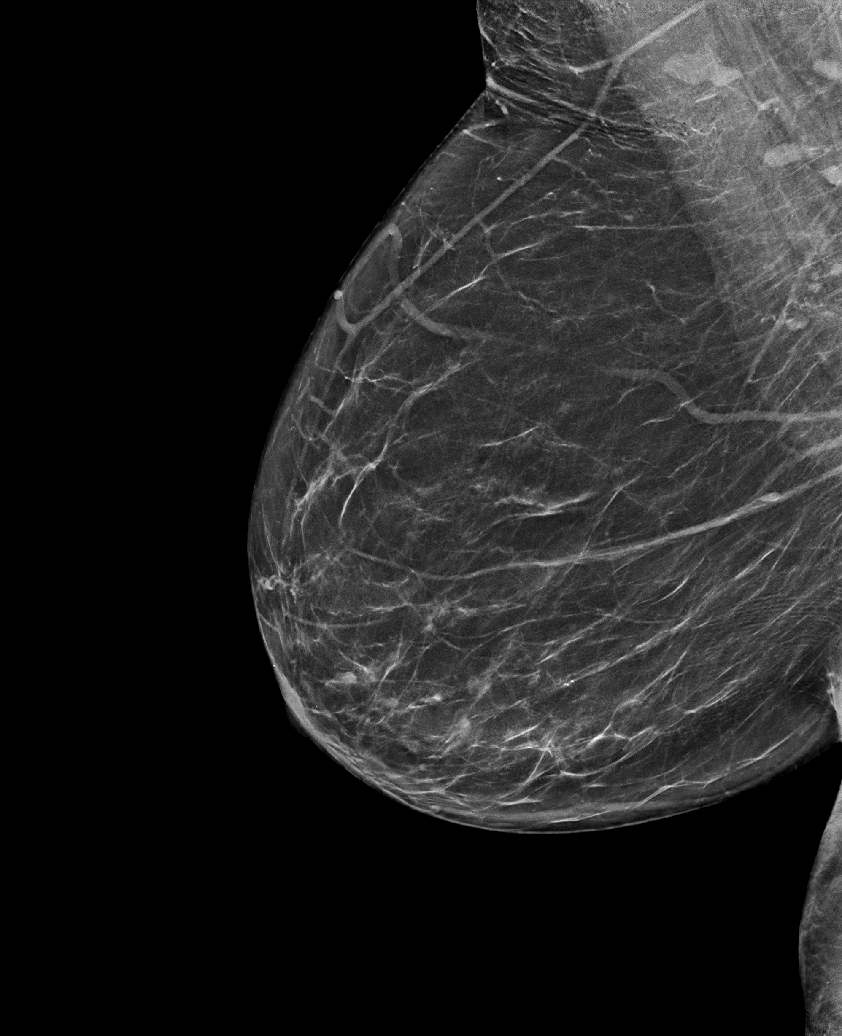

[L CC synth-2D]
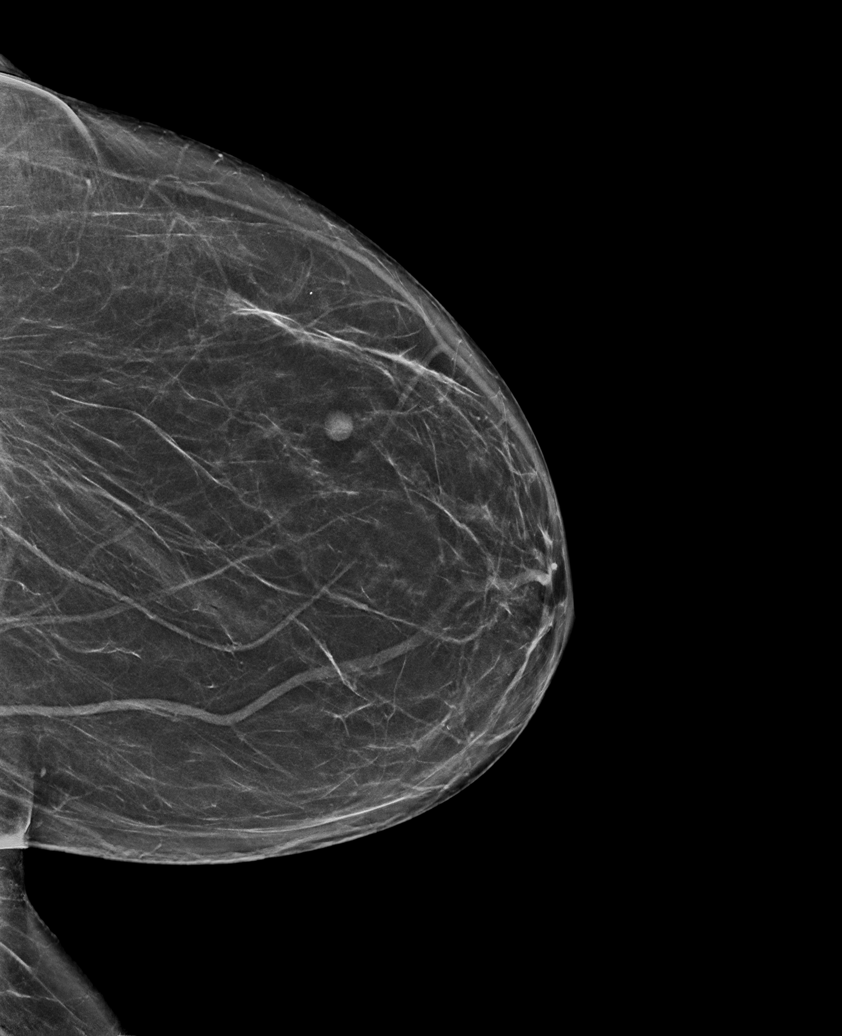

[L MLO synth-2D]
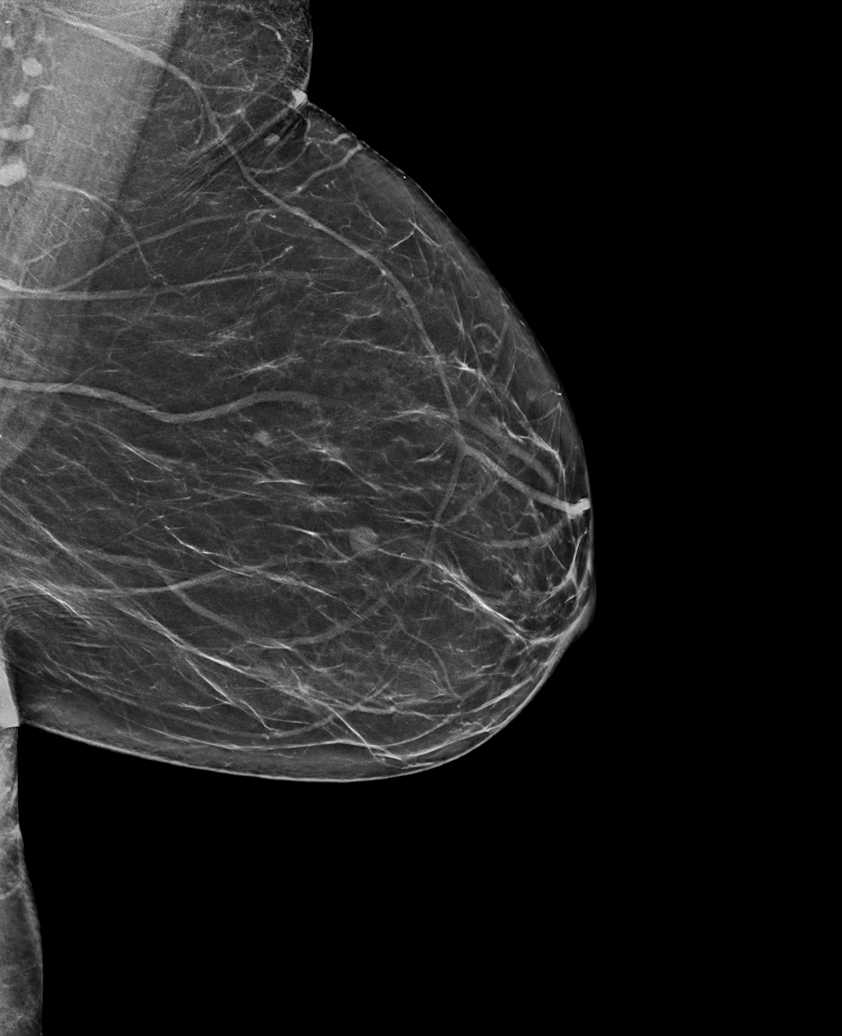

[6 of 36 positions shown; findings below may reference images not displayed]

ACR Breast Density Category b: There are scattered areas of
fibroglandular density.
FINDINGS: There are no findings suspicious for malignancy. Images were
processed with CAD.
IMPRESSION: No mammographic evidence of malignancy. A result letter of this
screening mammogram will be mailed directly to the patient.

RECOMMENDATION:
Screening mammogram in one year. (Code:[id number])

BI-RADS CATEGORY  1: Negative.

## 2020-12-20 ENCOUNTER — Other Ambulatory Visit: Payer: Self-pay

## 2020-12-20 ENCOUNTER — Ambulatory Visit (INDEPENDENT_AMBULATORY_CARE_PROVIDER_SITE_OTHER): Payer: Medicare HMO

## 2020-12-20 DIAGNOSIS — Z1231 Encounter for screening mammogram for malignant neoplasm of breast: Secondary | ICD-10-CM | POA: Diagnosis not present

## 2021-01-01 ENCOUNTER — Encounter: Payer: Self-pay | Admitting: Medical-Surgical

## 2021-01-01 DIAGNOSIS — E039 Hypothyroidism, unspecified: Secondary | ICD-10-CM

## 2021-01-04 DIAGNOSIS — E039 Hypothyroidism, unspecified: Secondary | ICD-10-CM | POA: Diagnosis not present

## 2021-01-05 ENCOUNTER — Other Ambulatory Visit: Payer: Self-pay | Admitting: Medical-Surgical

## 2021-01-05 LAB — TSH: TSH: 3.51 mIU/L (ref 0.40–4.50)

## 2021-01-05 MED ORDER — LEVOTHYROXINE SODIUM 75 MCG PO TABS: ORAL_TABLET | ORAL | 1 refills | Status: DC

## 2021-01-24 ENCOUNTER — Encounter: Payer: Self-pay | Admitting: Medical-Surgical

## 2021-01-24 ENCOUNTER — Ambulatory Visit (INDEPENDENT_AMBULATORY_CARE_PROVIDER_SITE_OTHER): Payer: Medicare HMO | Admitting: Medical-Surgical

## 2021-01-24 DIAGNOSIS — F418 Other specified anxiety disorders: Secondary | ICD-10-CM | POA: Diagnosis not present

## 2021-01-24 MED ORDER — SERTRALINE HCL 50 MG PO TABS: 50.0000 mg | ORAL_TABLET | Freq: Every day | ORAL | 3 refills | Status: DC

## 2021-01-24 NOTE — Progress Notes (Signed)
Virtual Visit via Video Note  I connected with Natasha Wilson on 01/24/21 at  2:40 PM EST by a video enabled telemedicine application and verified that I am speaking with the correct person using two identifiers.   I discussed the limitations of evaluation and management by telemedicine and the availability of in person appointments. The patient expressed understanding and agreed to proceed.  Patient location: home Provider locations: office  Subjective:    CC: mood follow up  HPI: Pleasant 70 year old female presenting via MyChart video visit for mood follow up. About 4 weeks ago, we started her back on Celexa that she had taken for a while many years ago. She has been tolerating the medication overall but has started having thoughts that are out of character and disturbing. Notes an episode of debilitating hopelessness as well as one of thoughts of suicide being her destiny. She has someone that she has been talking to about her stress/anxiety and that person urged her to tell her PCP about the thoughts to see if a medication change was indicated. Has taken Lexapro in the past but only for a week or two. Unsure why she stopped taking it. No other antidepressants in the past. Denies current SI/HI and has no plan in place for self-harm.   Past medical history, Surgical history, Family history not pertinant except as noted below, Social history, Allergies, and medications have been entered into the medical record, reviewed, and corrections made.   Review of Systems: See HPI for pertinent positives and negatives.   Depression screen [facility] 2/9 01/24/2021 12/11/2020 03/17/2020 09/24/2019 02/22/2019  Decreased Interest 3 3 1 1  0  Down, Depressed, Hopeless 2 3 1  0 0  PHQ - 2 Score 5 6 2 1  0  Altered sleeping 3 2 0 - -  Tired, decreased energy 3 2 0 - -  Change in appetite 3 3 0 - -  Feeling bad or failure about yourself  1 3 1  - -  Trouble concentrating 1 2 0 - -  Moving slowly or fidgety/restless 3  2 0 - -  Suicidal thoughts 1 1 0 - -  PHQ-9 Score 20 21 3  - -  Difficult doing work/chores Somewhat difficult Very difficult Somewhat difficult - -   GAD 7 : Generalized Anxiety Score 01/24/2021 12/11/2020 09/24/2019 01/02/2018  Nervous, Anxious, on Edge 3 3 2 1   Control/stop worrying 1 2 1 1   Worry too much - different things 0 2 1 0  Trouble relaxing 0 3 0 0  Restless 0 2 0 0  Easily annoyed or irritable 0 3 1 0  Afraid - awful might happen 1 3 0 0  Total GAD 7 Score 5 18 5 2   Anxiety Difficulty Not difficult at all Very difficult Somewhat difficult Not difficult at all   Objective:    General: Speaking clearly in complete sentences without any shortness of breath.  Alert and oriented x3.  Normal judgment. No apparent acute distress.  Impression and Recommendations:    1. Anxiety with depression Discontinue Celexa. Switching to Sertraline 50mg  daily. Advised patient that there is no need for a taper and she can start the new medication tomorrow. Advised that she should report any thoughts that are out of character, especially suicidal or homicidal ideation, immediately. She has agreed to reach out to our office for any further issues like this. I will send her a message in 1-2 weeks to check in and see how she is doing with the medication  switch and then plan to follow up in person in around 4-5 weeks.  I discussed the assessment and treatment plan with the patient. The patient was provided an opportunity to ask questions and all were answered. The patient agreed with the plan and demonstrated an understanding of the instructions.   The patient was advised to call back or seek an in-person evaluation if the symptoms worsen or if the condition fails to improve as anticipated.  25 minutes of non-face-to-face time was provided during this encounter.  Return in about 5 weeks (around 02/28/2021) for mood follow up.  Clearnce Sorrel, DNP, APRN, FNP-BC [place] [facility] and [facility]

## 2021-02-05 ENCOUNTER — Encounter: Payer: Self-pay | Admitting: Medical-Surgical

## 2021-02-13 ENCOUNTER — Other Ambulatory Visit: Payer: Self-pay | Admitting: Medical-Surgical

## 2021-03-01 ENCOUNTER — Encounter: Payer: Self-pay | Admitting: Medical-Surgical

## 2021-03-01 ENCOUNTER — Ambulatory Visit (INDEPENDENT_AMBULATORY_CARE_PROVIDER_SITE_OTHER): Payer: Medicare HMO | Admitting: Medical-Surgical

## 2021-03-01 ENCOUNTER — Other Ambulatory Visit: Payer: Self-pay

## 2021-03-01 VITALS — BP 111/63 | HR 68 | Resp 20 | Ht 63.0 in | Wt 157.8 lb

## 2021-03-01 DIAGNOSIS — M79604 Pain in right leg: Secondary | ICD-10-CM

## 2021-03-01 DIAGNOSIS — M79605 Pain in left leg: Secondary | ICD-10-CM | POA: Diagnosis not present

## 2021-03-01 DIAGNOSIS — R5383 Other fatigue: Secondary | ICD-10-CM

## 2021-03-01 DIAGNOSIS — F32A Depression, unspecified: Secondary | ICD-10-CM | POA: Diagnosis not present

## 2021-03-01 DIAGNOSIS — F418 Other specified anxiety disorders: Secondary | ICD-10-CM | POA: Diagnosis not present

## 2021-03-01 DIAGNOSIS — I10 Essential (primary) hypertension: Secondary | ICD-10-CM

## 2021-03-01 DIAGNOSIS — Z8669 Personal history of other diseases of the nervous system and sense organs: Secondary | ICD-10-CM | POA: Diagnosis not present

## 2021-03-01 MED ORDER — BUPROPION HCL ER (XL) 150 MG PO TB24: 150.0000 mg | ORAL_TABLET | ORAL | 0 refills | Status: DC

## 2021-03-01 MED ORDER — PRIMIDONE 50 MG PO TABS: 25.0000 mg | ORAL_TABLET | Freq: Every day | ORAL | 1 refills | Status: DC

## 2021-03-01 NOTE — Progress Notes (Signed)
°  HPI with pertinent ROS:   CC: mood follow up  HPI: Pleasant 70 year old female presenting today for mood follow up. Taking Sertraline 50mg  daily, tolerating well. Feels that it is working very well for her mood. Is handling family stress better and notes that the holidays were good. Still tired and ends up napping at least a couple of hours every day. Exercising 3 x weekly doing an aerobics class and stays busy with house work but if she stops and rests, notes she is just done for the day. Volunteers once a week at [employer]. Denies SI/HI.   Has had difficulty with bilateral hand tremors with intentional movement. Worse with anxiety. Interfering with daily activities. No tremor at rest. Has not taken medication for this before but interested in trying something.   Last week, noted two nights where she had severe BLE pain from the knee to the ankles, front and back. No injury or predisposing activity. Described as a throbbing. Was better in the morning. Has had no further episodes of pain since then. Treated by  putting on thick pajamas and getting under her blankets to stay warm.  I reviewed the past medical history, family history, social history, surgical history, and allergies today and no changes were needed.  Please see the problem list section below in epic for further details.   Physical exam:   General: Well Developed, well nourished, and in no acute distress.  Neuro: Alert and oriented x3.  HEENT: Normocephalic, atraumatic.  Skin: Warm and dry. Cardiac: Regular rate and rhythm, no murmurs rubs or gallops, no lower extremity edema.  Respiratory: Clear to auscultation bilaterally. Not using accessory muscles, speaking in full sentences.  Impression and Recommendations:    1. Anxiety with depression 2. Fatigue due to depression Symptoms well controlled outside of fatigue. Continue Zoloft 50mg  daily. Adding Wellbutrin 150mg  daily in the morning. Continue exercise and regular  activities.   3. Essential hypertension BP elevated on arrival but looks good on recheck. Continue Amlodipine as prescribed.   4. Pain in both lower extremities Unclear etiology. No symptoms today. Advised to monitor for return of the pain and any possible contributing factors.   5. History of benign essential tremor Starting primidone 25mg  at bedtime.  Return in about 4 weeks (around 03/29/2021) for mood/tremor follow up. ___________________________________________ Clearnce Sorrel, DNP, APRN, FNP-BC [facility] [place]

## 2021-03-12 ENCOUNTER — Other Ambulatory Visit: Payer: Self-pay | Admitting: Medical-Surgical

## 2021-04-03 ENCOUNTER — Ambulatory Visit (INDEPENDENT_AMBULATORY_CARE_PROVIDER_SITE_OTHER): Payer: Medicare HMO | Admitting: Medical-Surgical

## 2021-04-03 ENCOUNTER — Other Ambulatory Visit: Payer: Self-pay

## 2021-04-03 ENCOUNTER — Encounter: Payer: Self-pay | Admitting: Medical-Surgical

## 2021-04-03 VITALS — BP 113/65 | HR 65 | Resp 20 | Ht 68.0 in | Wt 158.0 lb

## 2021-04-03 DIAGNOSIS — Z8669 Personal history of other diseases of the nervous system and sense organs: Secondary | ICD-10-CM

## 2021-04-03 DIAGNOSIS — F418 Other specified anxiety disorders: Secondary | ICD-10-CM | POA: Diagnosis not present

## 2021-04-03 NOTE — Progress Notes (Signed)
Medical screening examination/treatment was performed by qualified clinical staff member and as supervising physician I was immediately available for consultation/collaboration. I have reviewed documentation and agree with assessment and plan.  Lower extremity rash appears to be self-resolving so no further intervention necessary today. Recommend monitoring for worsening or recurrence of the rash. Evaluate for potential sources of allergic reaction or irritation.   Stopping Wellbutrin due to intolerance. Patient still concerned with fatigue. Will touch base with my supervising MD for possible therapies to boost energy level without negatively affecting tremor.   Clearnce Sorrel, DNP, APRN, FNP-BC [facility]

## 2021-04-03 NOTE — Progress Notes (Signed)
°  HPI with pertinent ROS:   CC: anxiety, depression, tremors follow up  HPI: pleasant 71 y.o. female here for follow up for anxiety, depression, and tremors. States that her tremors are a "110". They have gotten worse to the point of not being able to write or use her computer without holding her hand still. She states that the primidone helped with her tremors for a few days but then worsened after starting Wellbutrin. Would like to stop this medication.   Mood is doing better. States she has not been having thoughts of hopelessness and denies SI or HI.She  believes the sertraline 50 mg dose is working well and does not believe she is having side effects. She is sleeping all night and "resting better". Going to counseling which she says is going well. Exercising 3x/week.  Rash on both calves. States she noticed the rash the other day that covered both posterior/lateral calves. The rash on right calf is mostly gone and the rash on her left calf has gone down a lot. Has not put anything on it. Did not change soaps or detergents. Denies itching, burning, discharge.   I reviewed the past medical history, family history, social history, surgical history, and allergies today and no changes were needed.  Please see the problem list section below in epic for further details.   Physical exam:   General: Well Developed, well nourished, and in no acute distress.  Neuro: Alert and oriented x3 HEENT: Normocephalic, atraumatic Skin: Warm and dry, irregular 3in patch on left lateral calf, no d/c or pustule.  Cardiac: Regular rate and rhythm, no murmurs rubs or gallops Respiratory: Clear to auscultation bilaterally. Not using accessory muscles, speaking in full sentences.  Impression and Recommendations:    1. Anxiety with depression -Doing well, no complaints -continue sertraline at current dose.  -Continue counseling  2. History of benign essential tremor -Continue Primidone  daily -Pt would like  to stop Wellbutrin, d/c medication.    Return for Follow up as needed pending further management recommendation. ___________________________________________ Jeanann Lewandowsky, Student NP

## 2021-04-30 ENCOUNTER — Other Ambulatory Visit: Payer: Self-pay | Admitting: Medical-Surgical

## 2021-05-21 ENCOUNTER — Other Ambulatory Visit: Payer: Self-pay | Admitting: Medical-Surgical

## 2021-06-05 ENCOUNTER — Other Ambulatory Visit: Payer: Self-pay | Admitting: Osteopathic Medicine

## 2021-06-05 DIAGNOSIS — E785 Hyperlipidemia, unspecified: Secondary | ICD-10-CM

## 2021-06-18 ENCOUNTER — Other Ambulatory Visit: Payer: Self-pay | Admitting: Medical-Surgical

## 2021-06-23 ENCOUNTER — Encounter: Payer: Self-pay | Admitting: Medical-Surgical

## 2021-06-25 ENCOUNTER — Encounter: Payer: Self-pay | Admitting: Medical-Surgical

## 2021-06-25 ENCOUNTER — Ambulatory Visit (INDEPENDENT_AMBULATORY_CARE_PROVIDER_SITE_OTHER): Payer: Medicare HMO | Admitting: Medical-Surgical

## 2021-06-25 VITALS — BP 127/72 | HR 71 | Resp 20 | Ht 68.0 in | Wt 162.3 lb

## 2021-06-25 DIAGNOSIS — R8290 Unspecified abnormal findings in urine: Secondary | ICD-10-CM | POA: Diagnosis not present

## 2021-06-25 DIAGNOSIS — N898 Other specified noninflammatory disorders of vagina: Secondary | ICD-10-CM | POA: Diagnosis not present

## 2021-06-25 DIAGNOSIS — J302 Other seasonal allergic rhinitis: Secondary | ICD-10-CM

## 2021-06-25 LAB — POCT URINALYSIS DIP (CLINITEK)
Bilirubin, UA: NEGATIVE
Blood, UA: NEGATIVE
Glucose, UA: NEGATIVE mg/dL
Ketones, POC UA: NEGATIVE mg/dL
Nitrite, UA: NEGATIVE
POC PROTEIN,UA: NEGATIVE
Spec Grav, UA: 1.025 (ref 1.010–1.025)
Urobilinogen, UA: 0.2 E.U./dL
pH, UA: 6 (ref 5.0–8.0)

## 2021-06-25 LAB — WET PREP FOR TRICH, YEAST, CLUE
MICRO NUMBER:: 13302057
Specimen Quality: ADEQUATE

## 2021-06-25 MED ORDER — PREMARIN 0.625 MG/GM VA CREA: 1.0000 g | TOPICAL_CREAM | Freq: Every day | VAGINAL | 12 refills | Status: DC

## 2021-06-25 MED ORDER — CLOTRIMAZOLE-BETAMETHASONE 1-0.05 % EX CREA: 1.0000 "application " | TOPICAL_CREAM | Freq: Two times a day (BID) | CUTANEOUS | 0 refills | Status: AC

## 2021-06-25 MED ORDER — IPRATROPIUM BROMIDE 0.03 % NA SOLN
2.0000 | Freq: Two times a day (BID) | NASAL | 0 refills | Status: DC
Start: 2021-06-25 — End: 2022-04-16

## 2021-06-25 NOTE — Progress Notes (Signed)
?  HPI with pertinent ROS:  ? ?CC:  ? ?HPI: ?Pleasant 71 year old female presenting today for: ? ?Sore throat- over the past couple of weeks. Has PND and a cough periodically. Outside a lot and keeps the door open at night. Woke up yesterday morning with sore throat that is raw.  ? ?Vaginal irritation- burning in the vaginal area for the last couple of weeks, progressively worsening. Burns when touching the area with toilet paper. Tried with Burnell Blanks wipes but still burned. Burning lasts for at least 30 minutes. No urinary symptoms.  ? ?I reviewed the past medical history, family history, social history, surgical history, and allergies today and no changes were needed.  Please see the problem list section below in epic for further details. ? ?Physical exam:  ? ?General: Well Developed, well nourished, and in no acute distress.  ?Neuro: Alert and oriented x3, extra-ocular muscles intact, sensation grossly intact.  ?HEENT: Normocephalic, atraumatic, pupils equal round reactive to light, neck supple, no masses, no lymphadenopathy, thyroid nonpalpable.  ?Skin: Warm and dry, no rashes. ?Cardiac: Regular rate and rhythm, no murmurs rubs or gallops, no lower extremity edema.  ?Respiratory: Clear to auscultation bilaterally. Not using accessory muscles, speaking in full sentences. ? ?Impression and Recommendations:   ? ?1. Vaginal irritation ?Wet prep stat.  POCT urinalysis positive for trace leukocytes but otherwise negative.  Sending for culture.  We will go ahead and treat topically for significant irritation with Lotrisone twice daily for 7 days.  Consider yeast versus BV versus atrophic vaginitis. ?- WET PREP FOR TRICH, YEAST, CLUE ?- POCT URINALYSIS DIP (CLINITEK) ? ?2. Seasonal allergic rhinitis, unspecified trigger ?Recommend switching from Zyrtec to either Xyzal, Claritin, or Allegra.  Adding Atrovent nasal spray to help with postnasal drip. ? ?Return if symptoms worsen or fail to  improve. ?___________________________________________ ?Clearnce Sorrel, DNP, APRN, FNP-BC ?[facility] ?[place] ?

## 2021-06-25 NOTE — Addendum Note (Signed)
Addended bySamuel Bouche on: 06/25/2021 01:26 PM ? ? Modules accepted: Orders ? ?

## 2021-06-25 NOTE — Addendum Note (Signed)
Addended by: Beverlee Nims on: 06/25/2021 11:39 AM ? ? Modules accepted: Orders ? ?

## 2021-06-27 LAB — URINE CULTURE
MICRO NUMBER:: 13307433
Result:: NO GROWTH
SPECIMEN QUALITY:: ADEQUATE

## 2021-06-27 MED ORDER — ESTRADIOL 0.1 MG/GM VA CREA
TOPICAL_CREAM | VAGINAL | 12 refills | Status: DC
Start: 2021-06-27 — End: 2021-09-25

## 2021-07-10 ENCOUNTER — Other Ambulatory Visit: Payer: Self-pay | Admitting: Medical-Surgical

## 2021-07-16 ENCOUNTER — Ambulatory Visit: Payer: Medicare HMO | Admitting: Medical-Surgical

## 2021-07-18 ENCOUNTER — Other Ambulatory Visit: Payer: Self-pay | Admitting: Medical-Surgical

## 2021-07-23 ENCOUNTER — Ambulatory Visit (INDEPENDENT_AMBULATORY_CARE_PROVIDER_SITE_OTHER): Payer: Medicare HMO | Admitting: Medical-Surgical

## 2021-07-23 DIAGNOSIS — Z0000 Encounter for general adult medical examination without abnormal findings: Secondary | ICD-10-CM

## 2021-07-23 DIAGNOSIS — Z780 Asymptomatic menopausal state: Secondary | ICD-10-CM

## 2021-07-23 NOTE — Progress Notes (Signed)
MEDICARE ANNUAL WELLNESS VISIT  07/23/2021  Telephone Visit Disclaimer This Medicare AWV was conducted by telephone due to national recommendations for restrictions regarding the COVID-19 Pandemic (e.g. social distancing).  I verified, using two identifiers, that I am speaking with Natasha Wilson or their authorized healthcare agent. I discussed the limitations, risks, security, and privacy concerns of performing an evaluation and management service by telephone and the potential availability of an in-person appointment in the future. The patient expressed understanding and agreed to proceed.  Location of Patient: Home Location of Provider (nurse):  In the office.  Subjective:    Natasha Wilson is a 71 y.o. female patient of Samuel Bouche, NP who had a Medicare Annual Wellness Visit today via telephone. Prim is Retired and lives alone. she has 2 children. she reports that she is socially active and does interact with friends/family regularly. she is moderately physically active and enjoys watching television and going to aerobics.  Patient Care Team: Samuel Bouche, NP as PCP - General (Nurse Practitioner)     07/23/2021    2:05 PM 03/17/2020    9:00 AM 02/22/2019   11:09 AM 02/11/2019    3:28 AM 02/09/2019    3:47 PM 02/08/2019    9:34 AM 02/16/2018   11:33 AM  Advanced Directives  Does Patient Have a Medical Advance Directive? Yes Yes Yes Yes Yes Yes Yes  Type of Advance Directive Living will Living will;Healthcare Power of [place];Living will [place];Living will Healthcare Power of [place];Living will  Does patient want to make changes to medical advance directive? No - Patient declined No - Patient declined No - Patient declined No - Patient declined No - Patient declined  Yes (MAU/Ambulatory/Procedural Areas - Information given)  Copy of [place] in Chart?  No -  copy requested Yes - validated most recent copy scanned in chart (See row information) No - copy requested Yes - validated most recent copy scanned in chart (See row information) Yes - validated most recent copy scanned in chart (See row information) No - copy requested  Would patient like information on creating a medical advance directive?  No - Patient declined No - Patient declined No - Patient declined       Hospital Utilization Over the Past 12 Months: # of hospitalizations or ER visits: 0 # of surgeries: 0  Review of Systems    Patient reports that her overall health is worse compared to last year.  History obtained from chart review and the patient  Patient Reported Readings (BP, Pulse, CBG, Weight, etc) none  Pain Assessment Pain : No/denies pain     Current Medications & Allergies (verified) Allergies as of 07/23/2021       Reactions   Latex Rash   Cefuroxime Nausea And Vomiting   Red splotches on face.   Valsartan Rash        Medication List        Accurate as of Jul 23, 2021  2:18 PM. If you have any questions, ask your nurse or doctor.          acetaminophen 500 MG tablet Commonly known as: TYLENOL Take 1,000 mg by mouth every 6 (six) hours as needed for moderate pain.   amLODipine 5 MG tablet Commonly known as: NORVASC Take 1 tablet by mouth once daily   buPROPion 150 MG 24 hr tablet Commonly known as: Wellbutrin  XL Take 1 tablet (150 mg total) by mouth every morning.   cetirizine 10 MG tablet Commonly known as: ZYRTEC Take 10 mg by mouth daily.   ciclopirox 8 % solution Commonly known as: PENLAC Apply topically at bedtime. Apply over nail and surrounding skin. Apply daily over previous coat. After seven (7) days, may remove with alcohol and continue cycle.   clobetasol cream 0.05 % Commonly known as: TEMOVATE Apply topically.   diphenhydrAMINE 25 MG tablet Commonly known as: BENADRYL Take 25 mg by mouth daily as needed for  allergies.   estradiol 0.1 MG/GM vaginal cream Commonly known as: ESTRACE VAGINAL Apply 0.5g topically to the vaginal area at bedtime daily for 14 days then reduce to twice weekly use.   ipratropium 0.03 % nasal spray Commonly known as: ATROVENT Place 2 sprays into both nostrils every 12 (twelve) hours.   levothyroxine 75 MCG tablet Commonly known as: SYNTHROID TAKE 1 TABLET BY MOUTH ONCE DAILY BEFORE BREAKFAST   primidone 50 MG tablet Commonly known as: MYSOLINE TAKE 1/2 (ONE-HALF) TABLET BY MOUTH AT BEDTIME   rosuvastatin 20 MG tablet Commonly known as: CRESTOR Take 1 tablet by mouth once daily   sertraline 50 MG tablet Commonly known as: ZOLOFT Take 1 tablet by mouth once daily        History (reviewed): Past Medical History:  Diagnosis Date   Allergy 6years   eczema   Depression    sometime   Generalized anxiety disorder 05/24/2016   GERD (gastroesophageal reflux disease)    History of benign essential tremor 10/24/2015   Panic attacks 03/15/2020   Panic attacks    Skin cancer    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   Thyroid activity decreased 11/10/2015   Past Surgical History:  Procedure Laterality Date   ABDOMINAL HYSTERECTOMY     ANTERIOR AND POSTERIOR REPAIR N/A 02/09/2019   Procedure: ANTERIOR (CYSTOCELE) AND POSTERIOR REPAIR (RECTOCELE);  Surgeon: Bjorn Loser, MD;  Location: [facility];  Service: Urology;  Laterality: N/A;   bladder tack     BREAST CYST EXCISION     CHOLECYSTECTOMY     CYSTOSCOPY N/A 02/09/2019   Procedure: CYSTOSCOPY;  Surgeon: Bjorn Loser, MD;  Location: [facility];  Service: Urology;  Laterality: N/A;   SKIN CANCER EXCISION N/A    from back   Family History  Problem Relation Age of Onset   Alcohol abuse Father    Heart attack Father    Cancer Sister        LUNG   Depression Paternal Grandfather    Dementia Mother    Stroke Mother    Alcohol abuse Son    Cancer Maternal Aunt    Social History   Socioeconomic History   Marital  status: Single    Spouse name: Not on file   Number of children: 2   Years of education: 14   Highest education level: Associate degree: academic program  Occupational History   Occupation: retired    Comment: [employer]  Tobacco Use   Smoking status: Former    Packs/day: 1.00    Years: 15.00    Pack years: 15.00    Types: Cigarettes    Quit date: 07/03/1993    Years since quitting: 28.0   Smokeless tobacco: Never  Vaping Use   Vaping Use: Never used  Substance and Sexual Activity   Alcohol use: Yes    Alcohol/week: 7.0 standard drinks    Types: 7 Glasses of wine per week    Comment:  4 ounces a night   Drug use: No   Sexual activity: Not Currently    Birth control/protection: None  Other Topics Concern   Not on file  Social History Narrative   Lives alone. Her grandson is staying for a few weeks. Runs errands. Watch TV. Reads a lot and plays games on the computer. Enjoys going to aerobics class three times a week. She also enjoys reading.   Social Determinants of Health   Financial Resource Strain: Low Risk    Difficulty of Paying Living Expenses: Not hard at all  Food Insecurity: No Food Insecurity   Worried About Charity fundraiser in the Last Year: Never true   [place] in the Last Year: Never true  Transportation Needs: No Transportation Needs   Lack of Transportation (Medical): No   Lack of Transportation (Non-Medical): No  Physical Activity: Sufficiently Active   Days of Exercise per Week: 3 days   Minutes of Exercise per Session: 60 min  Stress: No Stress Concern Present   Feeling of Stress : Not at all  Social Connections: Moderately Isolated   Frequency of Communication with Friends and Family: Never   Frequency of Social Gatherings with Friends and Family: Once a week   Attends Religious Services: More than 4 times per year   Active Member of [employer] or Organizations: Yes   Attends Archivist Meetings: More than 4 times per year   Marital  Status: Separated    Activities of Daily Living    07/19/2021    6:50 PM  In your present state of health, do you have any difficulty performing the following activities:  Hearing? 0  Vision? 0  Difficulty concentrating or making decisions? 0  Walking or climbing stairs? 0  Dressing or bathing? 0  Doing errands, shopping? 0  Preparing Food and eating ? N  Using the Toilet? N  In the past six months, have you accidently leaked urine? Y  Do you have problems with loss of bowel control? N  Managing your Medications? N  Managing your Finances? N  Housekeeping or managing your Housekeeping? N    Patient Education/ Literacy How often do you need to have someone help you when you read instructions, pamphlets, or other written materials from your doctor or pharmacy?: 1 - Never What is the last grade level you completed in school?: two year business degree  Exercise Current Exercise Habits: Structured exercise class, Time (Minutes): 60, Frequency (Times/Week): 3, Weekly Exercise (Minutes/Week): 180, Intensity: Moderate, Exercise limited by: None identified  Diet Patient reports consuming 2 meals a day and 1 snack(s) a day Patient reports that her primary diet is: Regular Patient reports that she does have regular access to food.   Depression Screen    07/23/2021    2:05 PM 06/25/2021   10:43 AM 04/03/2021    3:31 PM 03/01/2021    1:06 PM 01/24/2021    2:49 PM 12/11/2020    1:24 PM 03/17/2020    9:06 AM  PHQ 2/9 Scores  PHQ - 2 Score 0 '1 2 1 5 6 2  '$ PHQ- 9 Score   '16 5 20 21 3     '$ Fall Risk    07/23/2021    2:05 PM 07/19/2021    6:50 PM 06/25/2021   10:43 AM 04/03/2021    3:29 PM 03/01/2021   11:24 AM  Fall Risk   Falls in the past year? '1 1 1 1 1  '$ Number  falls in past yr: 0 0 0 1 1  Injury with Fall? 0 0 1 0 1  Comment     bruises  Risk for fall due to : History of fall(s);No Fall Risks  History of fall(s) History of fall(s) History of fall(s)  Follow up Falls evaluation  completed  Falls evaluation completed Falls evaluation completed Falls evaluation completed;Falls prevention discussed     Objective:  Natasha Wilson seemed alert and oriented and she participated appropriately during our telephone visit.  Blood Pressure Weight BMI  BP Readings from Last 3 Encounters:  06/25/21 127/72  04/03/21 113/65  03/01/21 111/63   Wt Readings from Last 3 Encounters:  06/25/21 162 lb 4.8 oz (73.6 kg)  04/03/21 158 lb (71.7 kg)  03/01/21 157 lb 12.8 oz (71.6 kg)   BMI Readings from Last 1 Encounters:  06/25/21 24.68 kg/m    *Unable to obtain current vital signs, weight, and BMI due to telephone visit type  Hearing/Vision  Natasha Wilson did not seem to have difficulty with hearing/understanding during the telephone conversation Reports that she has had a formal eye exam by an eye care professional within the past year Reports that she has not had a formal hearing evaluation within the past year *Unable to fully assess hearing and vision during telephone visit type  Cognitive Function:    07/23/2021    2:10 PM 03/17/2020    9:04 AM 02/22/2019   11:15 AM 02/16/2018   11:09 AM  6CIT Screen  What Year? 0 points 0 points 0 points 0 points  What month? 0 points 0 points 0 points 0 points  What time? 0 points 0 points 0 points 0 points  Count back from 20 0 points 0 points 0 points 0 points  Months in reverse  0 points 0 points 0 points  Repeat phrase  0 points 2 points 0 points  Total Score  0 points 2 points 0 points   (Normal:0-7, Significant for Dysfunction: >8)  Normal Cognitive Function Screening: Yes   Immunization & Health Maintenance Record Immunization History  Administered Date(s) Administered   Moderna Covid-19 Vaccine Bivalent Booster 36yr & up 03/26/2021   Moderna Sars-Covid-2 Vaccination 05/03/2019, 06/01/2019, 03/08/2020   Tdap 02/08/2006, 11/01/2016    Health Maintenance  Topic Date Due   Zoster Vaccines- Shingrix (1 of 2) 10/23/2021  (Originally 10/17/1969)   Pneumonia Vaccine 71 Years old (1 - PCV) 04/03/2022 (Originally 10/18/2015)   INFLUENZA VACCINE  10/02/2021   MAMMOGRAM  12/21/2022   COLONOSCOPY (Pts [phone] coverage will need to be confirmed)  08/10/2023   TETANUS/TDAP  11/02/2026   DEXA SCAN  Completed   COVID-19 Vaccine  Completed   Hepatitis C Screening  Completed   HPV VACCINES  Aged Out       Assessment  This is a routine wellness examination for [employer] Health Maintenance: Due or Overdue There are no preventive care reminders to display for this patient.   Natasha Wilson does not need a referral for [employer] Companyssistance: Care Management:   no Social Work:    no Prescription Assistance:  no Nutrition/Diabetes Education:  no   Plan:  Personalized Goals  Goals Addressed             This Visit's Progress    Patient Stated       Patient stated she would like to loose 10 lbs.       Personalized Health Maintenance & Screening Recommendations  Pneumococcal vaccine  Bone densitometry screening Shingrix vaccine   Patient declined the vaccines at this time.   Lung Cancer Screening Recommended: no (Low Dose CT Chest recommended if Age 35-80 years, 30 pack-year currently smoking OR have quit w/in past 15 years) Hepatitis C Screening recommended: no HIV Screening recommended: no  Advanced Directives: Written information was not prepared per patient's request.  Referrals & Orders Orders Placed This Encounter  Procedures   [place]    Follow-up Plan Follow-up with Samuel Bouche, NP as planned Bone density scan is due in December. Medicare wellness visit in one year.  Patient will access AVS on my chart.   I have personally reviewed and noted the following in the patient's chart:   Medical and social history Use of alcohol, tobacco or illicit drugs  Current medications and supplements Functional ability and status Nutritional status Physical activity Advanced  directives List of other physicians Hospitalizations, surgeries, and ER visits in previous 12 months Vitals Screenings to include cognitive, depression, and falls Referrals and appointments  In addition, I have reviewed and discussed with Natasha Wilson certain preventive protocols, quality metrics, and best practice recommendations. A written personalized care plan for preventive services as well as general preventive health recommendations is available and can be mailed to the patient at her request.      Tinnie Gens, RN  07/23/2021

## 2021-07-23 NOTE — Patient Instructions (Signed)
[place] Maintenance Summary and Written Plan of Care  Natasha Wilson ,  Thank you for allowing me to perform your Medicare Annual Wellness Visit and for your ongoing commitment to your health.   Health Maintenance & Immunization History Health Maintenance  Topic Date Due   Zoster Vaccines- Shingrix (1 of 2) 10/23/2021 (Originally 10/17/1969)   Pneumonia Vaccine 56+ Years old (1 - PCV) 04/03/2022 (Originally 10/18/2015)   INFLUENZA VACCINE  10/02/2021   MAMMOGRAM  12/21/2022   COLONOSCOPY (Pts 45-24yr Insurance coverage will need to be confirmed)  08/10/2023   TETANUS/TDAP  11/02/2026   DEXA SCAN  Completed   COVID-19 Vaccine  Completed   Hepatitis C Screening  Completed   HPV VACCINES  Aged Out   Immunization History  Administered Date(s) Administered   Moderna Covid-19 Vaccine Bivalent Booster 138yr& up 03/26/2021   Moderna Sars-Covid-2 Vaccination 05/03/2019, 06/01/2019, 03/08/2020   Tdap 02/08/2006, 11/01/2016    These are the patient goals that we discussed:  Goals Addressed             This Visit's Progress    Patient Stated       Patient stated she would like to loose 10 lbs.         This is a list of Health Maintenance Items that are overdue or due now: Pneumococcal vaccine  Bone densitometry screening Shingrix vaccine   Patient declined the vaccines at this time.   Orders/Referrals Placed Today: Orders Placed This Encounter  Procedures   DEXAScan    Standing Status:   Future    Standing Expiration Date:   07/24/2022    Scheduling Instructions:     Please call patient to schedule.    Order Specific Question:   Reason for exam:    Answer:   Post-menopausal    Order Specific Question:   Preferred imaging location?    Answer:   [facility]  (Contact our referral department at [phone] you have not spoken with someone about your referral appointment within the next 5 days)    Follow-up Plan Follow-up  with JeSamuel BoucheNP as planned Bone density scan is due in December. Medicare wellness visit in one year.  Patient will access AVS on my chart.    Health Maintenance, Female Adopting a healthy lifestyle and getting preventive care are important in promoting health and wellness. Ask your health care provider about: The right schedule for you to have regular tests and exams. Things you can do on your own to prevent diseases and keep yourself healthy. What should I know about diet, weight, and exercise? Eat a healthy diet  Eat a diet that includes plenty of vegetables, fruits, low-fat dairy products, and lean protein. Do not eat a lot of foods that are high in solid fats, added sugars, or sodium. Maintain a healthy weight Body mass index (BMI) is used to identify weight problems. It estimates body fat based on height and weight. Your health care provider can help determine your BMI and help you achieve or maintain a healthy weight. Get regular exercise Get regular exercise. This is one of the most important things you can do for your health. Most adults should: Exercise for at least 150 minutes each week. The exercise should increase your heart rate and make you sweat (moderate-intensity exercise). Do strengthening exercises at least twice a week. This is in addition to the moderate-intensity exercise. Spend less time sitting. Even light physical activity can be  beneficial. Watch cholesterol and blood lipids Have your blood tested for lipids and cholesterol at 71 years of age, then have this test every 5 years. Have your cholesterol levels checked more often if: Your lipid or cholesterol levels are high. You are older than 71 years of age. You are at high risk for heart disease. What should I know about cancer screening? Depending on your health history and family history, you may need to have cancer screening at various ages. This may include screening for: Breast cancer. Cervical  cancer. Colorectal cancer. Skin cancer. Lung cancer. What should I know about heart disease, diabetes, and high blood pressure? Blood pressure and heart disease High blood pressure causes heart disease and increases the risk of stroke. This is more likely to develop in people who have high blood pressure readings or are overweight. Have your blood pressure checked: Every 3-5 years if you are 75-73 years of age. Every year if you are 28 years old or older. Diabetes Have regular diabetes screenings. This checks your fasting blood sugar level. Have the screening done: Once every three years after age 89 if you are at a normal weight and have a low risk for diabetes. More often and at a younger age if you are overweight or have a high risk for diabetes. What should I know about preventing infection? Hepatitis B If you have a higher risk for hepatitis B, you should be screened for this virus. Talk with your health care provider to find out if you are at risk for hepatitis B infection. Hepatitis C Testing is recommended for: Everyone born from [birth year] through 1965. Anyone with known risk factors for hepatitis C. Sexually transmitted infections (STIs) Get screened for STIs, including gonorrhea and chlamydia, if: You are sexually active and are younger than 71 years of age. You are older than 71 years of age and your health care provider tells you that you are at risk for this type of infection. Your sexual activity has changed since you were last screened, and you are at increased risk for chlamydia or gonorrhea. Ask your health care provider if you are at risk. Ask your health care provider about whether you are at high risk for HIV. Your health care provider may recommend a prescription medicine to help prevent HIV infection. If you choose to take medicine to prevent HIV, you should first get tested for HIV. You should then be tested every 3 months for as long as you are taking the  medicine. Pregnancy If you are about to stop having your period (premenopausal) and you may become pregnant, seek counseling before you get pregnant. Take 400 to 800 micrograms (mcg) of folic acid every day if you become pregnant. Ask for birth control (contraception) if you want to prevent pregnancy. Osteoporosis and menopause Osteoporosis is a disease in which the bones lose minerals and strength with aging. This can result in bone fractures. If you are 42 years old or older, or if you are at risk for osteoporosis and fractures, ask your health care provider if you should: Be screened for bone loss. Take a calcium or vitamin D supplement to lower your risk of fractures. Be given hormone replacement therapy (HRT) to treat symptoms of menopause. Follow these instructions at home: Alcohol use Do not drink alcohol if: Your health care provider tells you not to drink. You are pregnant, may be pregnant, or are planning to become pregnant. If you drink alcohol: Limit how much you have to: 0-1 drink  a day. Know how much alcohol is in your drink. In the [place] one drink equals one 12 oz bottle of beer (355 mL), one 5 oz glass of wine (148 mL), or one 1 oz glass of hard liquor (44 mL). Lifestyle Do not use any products that contain nicotine or tobacco. These products include cigarettes, chewing tobacco, and vaping devices, such as e-cigarettes. If you need help quitting, ask your health care provider. Do not use street drugs. Do not share needles. Ask your health care provider for help if you need support or information about quitting drugs. General instructions Schedule regular health, dental, and eye exams. Stay current with your vaccines. Tell your health care provider if: You often feel depressed. You have ever been abused or do not feel safe at home. Summary Adopting a healthy lifestyle and getting preventive care are important in promoting health and wellness. Follow your health care  provider's instructions about healthy diet, exercising, and getting tested or screened for diseases. Follow your health care provider's instructions on monitoring your cholesterol and blood pressure. This information is not intended to replace advice given to you by your health care provider. Make sure you discuss any questions you have with your health care provider. Document Revised: 07/10/2020 Document Reviewed: 07/10/2020 Elsevier Patient Education  [employer].

## 2021-07-23 NOTE — Progress Notes (Unsigned)
Complete physical exam  Patient: Natasha Wilson   DOB: [birth year]   71 y.o. Female  MRN: [id number]  Subjective:    No chief complaint on file.   Natasha Wilson is a 71 y.o. female who presents today for a complete physical exam. She reports consuming a {diet types:17450} diet. {types:19826} She generally feels {DESC; WELL/FAIRLY WELL/POORLY:18703}. She reports sleeping {DESC; WELL/FAIRLY WELL/POORLY:18703}. She {does/does not:200015} have additional problems to discuss today.    Most recent fall risk assessment:    07/19/2021    6:50 PM  Fall Risk   Falls in the past year? 1  Number falls in past yr: 0  Injury with Fall? 0     Most recent depression screenings:    06/25/2021   10:43 AM 04/03/2021    3:31 PM  PHQ 2/9 Scores  PHQ - 2 Score 1 2  PHQ- 9 Score  16    {VISON DENTAL STD PSA (Optional):27386}  {History (Optional):23778}  Patient Care Team: Samuel Bouche, NP as PCP - General (Nurse Practitioner)   Outpatient Medications Prior to Visit  Medication Sig   sertraline (ZOLOFT) 50 MG tablet Take 1 tablet by mouth once daily   acetaminophen (TYLENOL) 500 MG tablet Take 1,000 mg by mouth every 6 (six) hours as needed for moderate pain.   amLODipine (NORVASC) 5 MG tablet Take 1 tablet by mouth once daily   buPROPion (WELLBUTRIN XL) 150 MG 24 hr tablet Take 1 tablet (150 mg total) by mouth every morning.   cetirizine (ZYRTEC) 10 MG tablet Take 10 mg by mouth daily.   ciclopirox (PENLAC) 8 % solution Apply topically at bedtime. Apply over nail and surrounding skin. Apply daily over previous coat. After seven (7) days, may remove with alcohol and continue cycle.   diphenhydrAMINE (BENADRYL) 25 MG tablet Take 25 mg by mouth daily as needed for allergies.   estradiol (ESTRACE VAGINAL) 0.1 MG/GM vaginal cream Apply 0.5g topically to the vaginal area at bedtime daily for 14 days then reduce to twice weekly use.   ipratropium (ATROVENT) 0.03 % nasal spray Place 2 sprays into both  nostrils every 12 (twelve) hours.   levothyroxine (SYNTHROID) 75 MCG tablet TAKE 1 TABLET BY MOUTH ONCE DAILY BEFORE BREAKFAST   primidone (MYSOLINE) 50 MG tablet TAKE 1/2 (ONE-HALF) TABLET BY MOUTH AT BEDTIME   rosuvastatin (CRESTOR) 20 MG tablet Take 1 tablet by mouth once daily   No facility-administered medications prior to visit.    ROS        Objective:     There were no vitals taken for this visit. {Vitals History (Optional):23777}  Physical Exam   No results found for any visits on 07/24/21. {Show previous labs (optional):23779}    Assessment & Plan:    Routine Health Maintenance and Physical Exam  Immunization History  Administered Date(s) Administered   Moderna Sars-Covid-2 Vaccination 05/03/2019, 06/01/2019, 03/08/2020   Tdap 02/08/2006, 11/01/2016    Health Maintenance  Topic Date Due   Zoster Vaccines- Shingrix (1 of 2) Never done   COVID-19 Vaccine (4 - Booster for Moderna series) 05/03/2020   Pneumonia Vaccine 76+ Years old (1 - PCV) 04/03/2022 (Originally 10/18/2015)   INFLUENZA VACCINE  10/02/2021   MAMMOGRAM  12/21/2022   COLONOSCOPY (Pts 45-80yr Insurance coverage will need to be confirmed)  08/10/2023   TETANUS/TDAP  11/02/2026   DEXA SCAN  Completed   Hepatitis C Screening  Completed   HPV VACCINES  Aged Out    Discussed health  benefits of physical activity, and encouraged her to engage in regular exercise appropriate for her age and condition.  Problem List Items Addressed This Visit       Endocrine   Thyroid activity decreased     Other   Annual physical exam - Primary   No follow-ups on file.     Samuel Bouche, NP

## 2021-07-24 ENCOUNTER — Encounter: Payer: Medicare HMO | Admitting: Medical-Surgical

## 2021-07-24 DIAGNOSIS — E039 Hypothyroidism, unspecified: Secondary | ICD-10-CM

## 2021-07-24 DIAGNOSIS — Z0000 Encounter for general adult medical examination without abnormal findings: Secondary | ICD-10-CM

## 2021-07-24 DIAGNOSIS — Z23 Encounter for immunization: Secondary | ICD-10-CM

## 2021-08-08 ENCOUNTER — Ambulatory Visit (INDEPENDENT_AMBULATORY_CARE_PROVIDER_SITE_OTHER): Payer: Medicare HMO

## 2021-08-08 DIAGNOSIS — Z0000 Encounter for general adult medical examination without abnormal findings: Secondary | ICD-10-CM

## 2021-08-08 DIAGNOSIS — Z780 Asymptomatic menopausal state: Secondary | ICD-10-CM

## 2021-08-08 DIAGNOSIS — M8589 Other specified disorders of bone density and structure, multiple sites: Secondary | ICD-10-CM | POA: Diagnosis not present

## 2021-08-10 ENCOUNTER — Other Ambulatory Visit: Payer: Self-pay | Admitting: Medical-Surgical

## 2021-08-13 ENCOUNTER — Other Ambulatory Visit: Payer: Self-pay | Admitting: Medical-Surgical

## 2021-08-16 ENCOUNTER — Other Ambulatory Visit: Payer: Self-pay | Admitting: Medical-Surgical

## 2021-08-27 ENCOUNTER — Other Ambulatory Visit: Payer: Self-pay | Admitting: Medical-Surgical

## 2021-08-27 DIAGNOSIS — E785 Hyperlipidemia, unspecified: Secondary | ICD-10-CM

## 2021-09-05 ENCOUNTER — Other Ambulatory Visit: Payer: Self-pay | Admitting: Medical-Surgical

## 2021-09-25 ENCOUNTER — Encounter: Payer: Self-pay | Admitting: Medical-Surgical

## 2021-09-25 ENCOUNTER — Ambulatory Visit (INDEPENDENT_AMBULATORY_CARE_PROVIDER_SITE_OTHER): Payer: Medicare HMO | Admitting: Medical-Surgical

## 2021-09-25 VITALS — BP 113/63 | HR 65 | Ht 62.0 in | Wt 158.0 lb

## 2021-09-25 DIAGNOSIS — Z0000 Encounter for general adult medical examination without abnormal findings: Secondary | ICD-10-CM | POA: Diagnosis not present

## 2021-09-25 DIAGNOSIS — E039 Hypothyroidism, unspecified: Secondary | ICD-10-CM | POA: Diagnosis not present

## 2021-09-25 DIAGNOSIS — N898 Other specified noninflammatory disorders of vagina: Secondary | ICD-10-CM | POA: Diagnosis not present

## 2021-09-25 NOTE — Progress Notes (Signed)
Complete physical exam  Patient: Natasha Wilson   DOB: February 22, 1951   71 y.o. Female  MRN: [id number]  Subjective:    Chief Complaint  Patient presents with   Annual Exam   Natasha Wilson is a 71 y.o. female who presents today for a complete physical exam. She reports consuming a general diet.  Stays physically active with aerobics and such.  She generally feels fairly well. She reports sleeping well. She does not have additional problems to discuss today.    Most recent fall risk assessment:    07/23/2021    2:05 PM  [place] in the past year? 1  Number falls in past yr: 0  Injury with Fall? 0  Risk for fall due to : History of fall(s);No Fall Risks  Follow up Falls evaluation completed     Most recent depression screenings:    07/23/2021    2:05 PM 06/25/2021   10:43 AM  PHQ 2/9 Scores  PHQ - 2 Score 0 1    Vision:Within last year, Dental: No current dental problems and No regular dental care , and STD: The patient denies history of sexually transmitted disease.    Patient Care Team: Samuel Bouche, NP as PCP - General (Nurse Practitioner)   Outpatient Medications Prior to Visit  Medication Sig   acetaminophen (TYLENOL) 500 MG tablet Take 1,000 mg by mouth every 6 (six) hours as needed for moderate pain.   amLODipine (NORVASC) 5 MG tablet Take 1 tablet by mouth once daily   buPROPion (WELLBUTRIN XL) 150 MG 24 hr tablet Take 1 tablet (150 mg total) by mouth every morning.   cetirizine (ZYRTEC) 10 MG tablet Take 10 mg by mouth daily.   ciclopirox (PENLAC) 8 % solution Apply topically at bedtime. Apply over nail and surrounding skin. Apply daily over previous coat. After seven (7) days, may remove with alcohol and continue cycle. (Patient not taking: Reported on 07/23/2021)   clobetasol cream (TEMOVATE) 0.05 % Apply topically.   diphenhydrAMINE (BENADRYL) 25 MG tablet Take 25 mg by mouth daily as needed for allergies.   ipratropium (ATROVENT) 0.03 % nasal spray Place 2  sprays into both nostrils every 12 (twelve) hours.   levothyroxine (SYNTHROID) 75 MCG tablet TAKE 1 TABLET BY MOUTH ONCE DAILY BEFORE BREAKFAST   primidone (MYSOLINE) 50 MG tablet TAKE 1/2 (ONE-HALF) TABLET BY MOUTH AT BEDTIME   rosuvastatin (CRESTOR) 20 MG tablet Take 1 tablet (20 mg total) by mouth daily. Needs appointment.   sertraline (ZOLOFT) 50 MG tablet Take 1 tablet (50 mg total) by mouth daily. Needs appointment.   [DISCONTINUED] estradiol (ESTRACE VAGINAL) 0.1 MG/GM vaginal cream Apply 0.5g topically to the vaginal area at bedtime daily for 14 days then reduce to twice weekly use. (Patient not taking: Reported on 07/23/2021)   No facility-administered medications prior to visit.   Review of Systems  Constitutional:  Positive for malaise/fatigue. Negative for chills, diaphoresis, fever and weight loss.  HENT: Negative.    Eyes: Negative.   Respiratory:  Negative for cough, sputum production, shortness of breath and wheezing.   Cardiovascular:  Negative for chest pain, palpitations and leg swelling.  Gastrointestinal:  Positive for constipation. Negative for abdominal pain, blood in stool, diarrhea, heartburn, melena, nausea and vomiting.  Genitourinary: Negative.   Musculoskeletal:  Positive for falls, joint pain and myalgias.  Neurological:  Positive for dizziness. Negative for tingling, seizures, loss of consciousness, weakness and headaches.  Endo/Heme/Allergies:  Negative for polydipsia.  Bruises/bleeds easily.  Psychiatric/Behavioral:  Negative for depression and suicidal ideas. The patient is not nervous/anxious and does not have insomnia.      Objective:    BP 113/63   Pulse 65   Ht '5\' 2"'$  (1.575 m)   Wt 158 lb (71.7 kg)   SpO2 99%   BMI 28.90 kg/m    Physical Exam Constitutional:      General: She is not in acute distress.    Appearance: Normal appearance. She is not ill-appearing.  HENT:     Head: Normocephalic and atraumatic.     Right Ear: Tympanic membrane,  ear canal and external ear normal. There is no impacted cerumen.     Left Ear: Tympanic membrane, ear canal and external ear normal. There is no impacted cerumen.     Nose: Nose normal.     Mouth/Throat:     Mouth: Mucous membranes are moist.     Pharynx: No oropharyngeal exudate or posterior oropharyngeal erythema.  Eyes:     Extraocular Movements: Extraocular movements intact.     Conjunctiva/sclera: Conjunctivae normal.     Pupils: Pupils are equal, round, and reactive to light.  Neck:     Thyroid: No thyromegaly.     Vascular: No carotid bruit or JVD.     Trachea: Trachea normal.  Cardiovascular:     Rate and Rhythm: Normal rate and regular rhythm.     Pulses: Normal pulses.     Heart sounds: Normal heart sounds. No murmur heard.    No friction rub. No gallop.  Pulmonary:     Effort: Pulmonary effort is normal. No respiratory distress.     Breath sounds: Normal breath sounds. No wheezing.  Abdominal:     General: Bowel sounds are normal. There is no distension.     Palpations: Abdomen is soft.     Tenderness: There is no abdominal tenderness. There is no guarding.  Musculoskeletal:        General: Normal range of motion.     Cervical back: Normal range of motion and neck supple.  Skin:    General: Skin is warm and dry.  Neurological:     Mental Status: She is alert and oriented to person, place, and time.     Cranial Nerves: No cranial nerve deficit.  Psychiatric:        Mood and Affect: Mood normal.        Behavior: Behavior normal.        Thought Content: Thought content normal.        Judgment: Judgment normal.   No results found for any visits on 09/25/21.     Assessment & Plan:    Routine Health Maintenance and Physical Exam  Immunization History  Administered Date(s) Administered   Moderna Covid-19 Vaccine Bivalent Booster 45yr & up 03/26/2021   Moderna Sars-Covid-2 Vaccination 05/03/2019, 06/01/2019, 03/08/2020   Tdap 02/08/2006, 11/01/2016    Health  Maintenance  Topic Date Due   Zoster Vaccines- Shingrix (1 of 2) 10/23/2021 (Originally 10/17/1969)   Pneumonia Vaccine 71 Years old (1 - PCV) 04/03/2022 (Originally 10/18/2015)   INFLUENZA VACCINE  10/02/2021   MAMMOGRAM  12/21/2022   COLONOSCOPY (Pts 45-444yrInsurance coverage will need to be confirmed)  08/10/2023   TETANUS/TDAP  11/02/2026   DEXA SCAN  Completed   COVID-19 Vaccine  Completed   Hepatitis C Screening  Completed   HPV VACCINES  Aged Out    Discussed health benefits of physical activity, and encouraged her  to engage in regular exercise appropriate for her age and condition.  1. Annual physical exam Checking labs as below. UTD on preventative care. Wellness information provided with AVS.  - CBC with Differential/Platelet - COMPLETE METABOLIC PANEL WITH GFR - Lipid panel  2. Vaginal irritation Discussed OTC options for vaginal dryness since prescription creams were not financially feasible.   3. Hypothyroidism, unspecified type Rechecking labs. If TSH is on the higher end of normal, may benefit from starting  a low dose levothyroxine to see if it helps with her overwhelming fatigue.  - TSH - T4, free  Return in about 6 months (around 03/28/2022) for chronic disease follow up.   Samuel Bouche, NP

## 2021-09-26 DIAGNOSIS — Z0000 Encounter for general adult medical examination without abnormal findings: Secondary | ICD-10-CM | POA: Diagnosis not present

## 2021-09-26 DIAGNOSIS — E039 Hypothyroidism, unspecified: Secondary | ICD-10-CM | POA: Diagnosis not present

## 2021-09-27 LAB — CBC WITH DIFFERENTIAL/PLATELET
Absolute Monocytes: 384 cells/uL (ref 200–950)
Basophils Absolute: 38 cells/uL (ref 0–200)
Basophils Relative: 0.6 %
Eosinophils Absolute: 132 cells/uL (ref 15–500)
Eosinophils Relative: 2.1 %
HCT: 39.6 % (ref 35.0–45.0)
Hemoglobin: 12.9 g/dL (ref 11.7–15.5)
Lymphs Abs: 995 cells/uL (ref 850–3900)
MCH: 27.1 pg (ref 27.0–33.0)
MCHC: 32.6 g/dL (ref 32.0–36.0)
MCV: 83.2 fL (ref 80.0–100.0)
MPV: 9.6 fL (ref 7.5–12.5)
Monocytes Relative: 6.1 %
Neutro Abs: 4750 cells/uL (ref 1500–7800)
Neutrophils Relative %: 75.4 %
Platelets: 185 10*3/uL (ref 140–400)
RBC: 4.76 10*6/uL (ref 3.80–5.10)
RDW: 13.4 % (ref 11.0–15.0)
Total Lymphocyte: 15.8 %
WBC: 6.3 10*3/uL (ref 3.8–10.8)

## 2021-09-27 LAB — COMPLETE METABOLIC PANEL WITH GFR
AG Ratio: 1.8 (calc) (ref 1.0–2.5)
ALT: 26 U/L (ref 6–29)
AST: 41 U/L — ABNORMAL HIGH (ref 10–35)
Albumin: 4.4 g/dL (ref 3.6–5.1)
Alkaline phosphatase (APISO): 73 U/L (ref 37–153)
BUN/Creatinine Ratio: 21 (calc) (ref 6–22)
BUN: 12 mg/dL (ref 7–25)
CO2: 25 mmol/L (ref 20–32)
Calcium: 9.4 mg/dL (ref 8.6–10.4)
Chloride: 102 mmol/L (ref 98–110)
Creat: 0.56 mg/dL — ABNORMAL LOW (ref 0.60–1.00)
Globulin: 2.5 g/dL (calc) (ref 1.9–3.7)
Glucose, Bld: 108 mg/dL — ABNORMAL HIGH (ref 65–99)
Potassium: 4.1 mmol/L (ref 3.5–5.3)
Sodium: 137 mmol/L (ref 135–146)
Total Bilirubin: 0.8 mg/dL (ref 0.2–1.2)
Total Protein: 6.9 g/dL (ref 6.1–8.1)
eGFR: 98 mL/min/{1.73_m2} (ref >=60)

## 2021-09-27 LAB — LIPID PANEL
Cholesterol: 146 mg/dL (ref <200)
HDL: 64 mg/dL (ref >=50)
LDL Cholesterol (Calc): 62 mg/dL (calc)
Non-HDL Cholesterol (Calc): 82 mg/dL (calc) (ref <130)
Total CHOL/HDL Ratio: 2.3 (calc) (ref <5.0)
Triglycerides: 118 mg/dL (ref <150)

## 2021-09-27 LAB — TSH: TSH: 3.35 mIU/L (ref 0.40–4.50)

## 2021-09-27 LAB — T4, FREE: Free T4: 0.9 ng/dL (ref 0.8–1.8)

## 2021-09-27 MED ORDER — LEVOTHYROXINE SODIUM 88 MCG PO TABS
88.0000 ug | ORAL_TABLET | Freq: Every day | ORAL | 1 refills | Status: DC
Start: 2021-09-27 — End: 2021-11-15

## 2021-09-27 NOTE — Addendum Note (Signed)
Addended bySamuel Bouche on: 09/27/2021 07:09 AM   Modules accepted: Orders

## 2021-10-01 ENCOUNTER — Other Ambulatory Visit: Payer: Self-pay | Admitting: Medical-Surgical

## 2021-10-01 DIAGNOSIS — E785 Hyperlipidemia, unspecified: Secondary | ICD-10-CM

## 2021-10-09 ENCOUNTER — Other Ambulatory Visit: Payer: Self-pay | Admitting: Medical-Surgical

## 2021-10-15 ENCOUNTER — Other Ambulatory Visit: Payer: Self-pay | Admitting: Medical-Surgical

## 2021-10-22 ENCOUNTER — Other Ambulatory Visit: Payer: Self-pay | Admitting: Medical-Surgical

## 2021-10-22 DIAGNOSIS — E785 Hyperlipidemia, unspecified: Secondary | ICD-10-CM

## 2021-10-23 ENCOUNTER — Encounter: Payer: Self-pay | Admitting: Medical-Surgical

## 2021-10-23 ENCOUNTER — Ambulatory Visit (INDEPENDENT_AMBULATORY_CARE_PROVIDER_SITE_OTHER): Payer: Medicare HMO | Admitting: Medical-Surgical

## 2021-10-23 VITALS — BP 129/69 | HR 65 | Resp 20 | Ht 62.0 in | Wt 159.5 lb

## 2021-10-23 DIAGNOSIS — N3001 Acute cystitis with hematuria: Secondary | ICD-10-CM | POA: Diagnosis not present

## 2021-10-23 DIAGNOSIS — R300 Dysuria: Secondary | ICD-10-CM

## 2021-10-23 DIAGNOSIS — Z62820 Parent-biological child conflict: Secondary | ICD-10-CM

## 2021-10-23 DIAGNOSIS — R21 Rash and other nonspecific skin eruption: Secondary | ICD-10-CM

## 2021-10-23 DIAGNOSIS — R197 Diarrhea, unspecified: Secondary | ICD-10-CM

## 2021-10-23 LAB — POCT URINALYSIS DIP (CLINITEK)
Bilirubin, UA: NEGATIVE
Glucose, UA: 100 mg/dL — AB
Ketones, POC UA: NEGATIVE mg/dL
Nitrite, UA: POSITIVE — AB
POC PROTEIN,UA: NEGATIVE
Spec Grav, UA: 1.01 (ref 1.010–1.025)
Urobilinogen, UA: 0.2 E.U./dL
pH, UA: 5.5 (ref 5.0–8.0)

## 2021-10-23 MED ORDER — TRIAMCINOLONE ACETONIDE 0.1 % EX CREA: 1.0000 | TOPICAL_CREAM | Freq: Two times a day (BID) | CUTANEOUS | 0 refills | Status: DC

## 2021-10-23 MED ORDER — SULFAMETHOXAZOLE-TRIMETHOPRIM 800-160 MG PO TABS: 1.0000 | ORAL_TABLET | Freq: Two times a day (BID) | ORAL | 0 refills | Status: DC

## 2021-10-23 MED ORDER — PHENAZOPYRIDINE HCL 200 MG PO TABS: 200.0000 mg | ORAL_TABLET | Freq: Three times a day (TID) | ORAL | 0 refills | Status: DC | PRN

## 2021-10-23 MED ORDER — PRIMIDONE 50 MG PO TABS: ORAL_TABLET | ORAL | 1 refills | Status: DC

## 2021-10-23 NOTE — Patient Instructions (Signed)
[facility] [address] [place], [place] [address] Hours: Outpatient: Mon-Fri 8AM to 5PM [facility] Unit: 24/7 Phone: [phone] Fax: [address] [place], [place] [address] Call us [phone]  [place] [address] [place], [place] [address] [phone]  Other options: Call 911 for wellness check and report that he is a danger to himself Take him to the nearest ED for behavioral health concerns

## 2021-10-23 NOTE — Progress Notes (Signed)
Established Patient Office Visit  Subjective   Patient ID: Natasha Wilson, female   DOB: [birth year] Age: 71 y.o. MRN: [id number]   Chief Complaint  Patient presents with   Cystitis   HPI Pleasant 71 year old female presenting today for the following:  Dysuria: Reports that she woke up yesterday with urinary frequency and burning.  Has had some chills and lower abdominal pain.  No fevers, vomiting, nausea, or gross hematuria.  Diarrhea: Notes that she has had intermittent diarrhea over the weekend but it has been severe today with 4 episodes.  No abnormal smells, black tarry stools, hematochezia, recent antibiotics or hospitalizations.  No new exposures to foods, medications, or restaurants.  Mood: She is doing well overall on sertraline and Wellbutrin.  Unfortunately, her son has turned back up and has been coming to her house reporting that he is starving.  He is homeless at this point and still struggling with alcoholism.  He does have a history of drugs but she is not 100% sure if he has stayed away from those or if he has gone back to it.  She is very worried as mother and is terrified that he is going to die if something is not done.  She is asking for resources to see if there is something she can do to help.  Facial rash: Notes that she had a facial rash come up after spending some time in the sun.  She thought it was related to heat since the temperatures were very warm.  The heat rash part of it went away however she has been left with redness on her upper lip going down around her chin and on both sides.  It is not itchy but it does feel very dry and chapped.  She has not put only anything outside of her typical facial cream.  She did try a new facial wash recently but no other new exposures.  Objective:    Vitals:   10/23/21 0921  BP: 129/69  Pulse: 65  Resp: 20  Height: '5\' 2"'$  (1.575 m)  Weight: 159 lb 8 oz (72.3 kg)  SpO2: 97%  BMI (Calculated): 29.17   Physical  Exam Vitals and nursing note reviewed.  Constitutional:      General: She is not in acute distress.    Appearance: Normal appearance. She is not ill-appearing.  HENT:     Head: Normocephalic and atraumatic.  Cardiovascular:     Rate and Rhythm: Normal rate and regular rhythm.     Pulses: Normal pulses.     Heart sounds: Normal heart sounds.  Pulmonary:     Effort: Pulmonary effort is normal. No respiratory distress.     Breath sounds: Normal breath sounds. No wheezing, rhonchi or rales.  Skin:    General: Skin is warm and dry.  Neurological:     Mental Status: She is alert and oriented to person, place, and time.  Psychiatric:        Mood and Affect: Mood normal.        Behavior: Behavior normal.        Thought Content: Thought content normal.        Judgment: Judgment normal.    Results for orders placed or performed in visit on 10/23/21 (from the past 24 hour(s))  POCT URINALYSIS DIP (CLINITEK)     Status: Abnormal   Collection Time: 10/23/21  9:55 AM  Result Value Ref Range   Color, UA yellow yellow   Clarity, UA clear  clear   Glucose, UA =100 (A) negative mg/dL   Bilirubin, UA negative negative   Ketones, POC UA negative negative mg/dL   Spec Grav, UA 1.010 1.010 - 1.025   Blood, UA moderate (A) negative   pH, UA 5.5 5.0 - 8.0   POC PROTEIN,UA negative negative, trace   Urobilinogen, UA 0.2 0.2 or 1.0 E.U./dL   Nitrite, UA Positive (A) Negative   Leukocytes, UA Small (1+) (A) Negative       The 10-year ASCVD risk score (Arnett DK, et al., 2019) is: 12.8%   Values used to calculate the score:     Age: 53 years     Sex: Female     Is Non-Hispanic African American: No     Diabetic: No     Tobacco smoker: No     Systolic Blood Pressure: 818 mmHg     Is BP treated: Yes     HDL Cholesterol: 64 mg/dL     Total Cholesterol: 146 mg/dL   Assessment & Plan:   1. Dysuria POCT urinalysis positive for blood, small leukocytes, and nitrites.  Sending for culture. -  POCT URINALYSIS DIP (CLINITEK) - Urine Culture  2. Acute cystitis with hematuria Given above results, treating for UTI with Bactrim twice daily x3 days.  Refilling Pyridium for as needed use.  Push fluids and practice good bladder hygiene.  Advised patient that we may need to change her antibiotic depending on culture results.  Patient verbalized understanding is agreeable to the plan.  3. Diarrhea, unspecified type Unclear etiology.  Potentially related to current UTI and the inflammation in the area however could be related to a viral illness.  Recommend pushing fluids and following the brat diet.  Advised to monitor symptoms for improvement as her UTI resolves.  If symptoms worsen or fail to improve, return for further evaluation.  4. Facial rash Unclear etiology.  Skin does appear chapped on exam.  Recommend adding an emollient twice daily such as Aquaphor or Eucerin.  She has this at home already and will try this.  Sending in triamcinolone cream twice daily as needed to the area for up to 14 days if no benefit from emollient use.  5. Relationship problem between parent and child Community resources regarding mental health and alcoholism added to AVS and discussed with patient.  Return if symptoms worsen or fail to improve.  ___________________________________________ Clearnce Sorrel, DNP, APRN, FNP-BC Primary Care and [place]

## 2021-10-25 LAB — URINE CULTURE
MICRO NUMBER:: 13818425
SPECIMEN QUALITY:: ADEQUATE

## 2021-10-29 DIAGNOSIS — L570 Actinic keratosis: Secondary | ICD-10-CM | POA: Diagnosis not present

## 2021-10-29 DIAGNOSIS — L209 Atopic dermatitis, unspecified: Secondary | ICD-10-CM | POA: Diagnosis not present

## 2021-10-29 DIAGNOSIS — L814 Other melanin hyperpigmentation: Secondary | ICD-10-CM | POA: Diagnosis not present

## 2021-10-29 DIAGNOSIS — D2239 Melanocytic nevi of other parts of face: Secondary | ICD-10-CM | POA: Diagnosis not present

## 2021-10-29 DIAGNOSIS — L578 Other skin changes due to chronic exposure to nonionizing radiation: Secondary | ICD-10-CM | POA: Diagnosis not present

## 2021-11-01 DIAGNOSIS — H5213 Myopia, bilateral: Secondary | ICD-10-CM | POA: Diagnosis not present

## 2021-11-01 DIAGNOSIS — H2513 Age-related nuclear cataract, bilateral: Secondary | ICD-10-CM | POA: Diagnosis not present

## 2021-11-04 ENCOUNTER — Other Ambulatory Visit: Payer: Self-pay | Admitting: Medical-Surgical

## 2021-11-06 ENCOUNTER — Other Ambulatory Visit: Payer: Self-pay | Admitting: Medical-Surgical

## 2021-11-06 DIAGNOSIS — Z1231 Encounter for screening mammogram for malignant neoplasm of breast: Secondary | ICD-10-CM

## 2021-11-07 ENCOUNTER — Other Ambulatory Visit: Payer: Self-pay | Admitting: Medical-Surgical

## 2021-11-13 DIAGNOSIS — E039 Hypothyroidism, unspecified: Secondary | ICD-10-CM | POA: Diagnosis not present

## 2021-11-14 ENCOUNTER — Encounter: Payer: Self-pay | Admitting: Medical-Surgical

## 2021-11-14 LAB — TSH: TSH: 5.42 mIU/L — ABNORMAL HIGH (ref 0.40–4.50)

## 2021-11-14 LAB — T4, FREE: Free T4: 0.9 ng/dL (ref 0.8–1.8)

## 2021-11-15 ENCOUNTER — Other Ambulatory Visit: Payer: Self-pay | Admitting: Medical-Surgical

## 2021-11-15 MED ORDER — LEVOTHYROXINE SODIUM 100 MCG PO TABS
100.0000 ug | ORAL_TABLET | Freq: Every day | ORAL | 1 refills | Status: DC
Start: 2021-11-15 — End: 2022-05-07

## 2021-11-20 ENCOUNTER — Ambulatory Visit
Admission: RE | Admit: 2021-11-20 | Discharge: 2021-11-20 | Disposition: A | Discharge status: Home / Self Care | Payer: Medicare HMO | Source: Ambulatory Visit | Attending: Family Medicine | Admitting: Family Medicine

## 2021-11-20 VITALS — BP 157/73 | HR 66 | Temp 97.7°F | Resp 14

## 2021-11-20 DIAGNOSIS — N3001 Acute cystitis with hematuria: Secondary | ICD-10-CM

## 2021-11-20 LAB — POCT URINALYSIS DIP (MANUAL ENTRY)
Bilirubin, UA: NEGATIVE
Glucose, UA: 100 mg/dL — AB
Ketones, POC UA: NEGATIVE mg/dL
Nitrite, UA: POSITIVE — AB
Protein Ur, POC: 30 mg/dL — AB
Spec Grav, UA: <=1.005 — AB (ref 1.010–1.025)
Urobilinogen, UA: 2 E.U./dL — AB
pH, UA: 5 (ref 5.0–8.0)

## 2021-11-20 MED ORDER — CIPROFLOXACIN HCL 250 MG PO TABS: 250.0000 mg | ORAL_TABLET | Freq: Two times a day (BID) | ORAL | 0 refills | Status: AC

## 2021-11-20 NOTE — Discharge Instructions (Addendum)
Advised patient to take medication as directed with food to completion.  Encouraged patient increase daily water intake while taking this medication.  Advised patient we will follow-up with urine culture results once received.  Advised patient if symptoms worsen and/or unresolved please follow-up with PCP or here for further evaluation.

## 2021-11-20 NOTE — ED Provider Notes (Signed)
[facility]    CSN: [id number] Arrival date & time: 11/20/21  1736      History   Chief Complaint Chief Complaint  Patient presents with   Dysuria    HPI MARKEISHA MANCIAS is a 71 y.o. female.   HPI Very pleasant 71 year old female presents with dysuria since this afternoon.  Patient endorses history of UTI.  PMH significant for atopic dermatitis, essential tremor, and cystocele with prolapse.  Past Medical History:  Diagnosis Date   Allergy 6years   eczema   Depression    sometime   Generalized anxiety disorder 05/24/2016   GERD (gastroesophageal reflux disease)    History of benign essential tremor 10/24/2015   Panic attacks 03/15/2020   Panic attacks    Skin cancer    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   Thyroid activity decreased 11/10/2015    Patient Active Problem List   Diagnosis Date Noted   Atopic dermatitis 10/18/2020   Lumbar degenerative disc disease 09/19/2020   Cystocele with prolapse 02/09/2019   Postmenopausal atrophic vaginitis 08/11/2018   Prolapse of anterior vaginal wall 08/11/2018   Posterior vaginal wall prolapse 08/11/2018   Onychomycosis 11/01/2016   History of chronic eczema 11/01/2016   Generalized anxiety disorder 05/24/2016   S/P colonoscopy 12/20/2015   Thyroid activity decreased 11/10/2015   Relationship problem between parent and child 10/24/2015   Annual physical exam 10/24/2015   History of benign essential tremor 10/24/2015   History of hysterectomy for benign disease 10/24/2015    Past Surgical History:  Procedure Laterality Date   ABDOMINAL HYSTERECTOMY     ANTERIOR AND POSTERIOR REPAIR N/A 02/09/2019   Procedure: ANTERIOR (CYSTOCELE) AND POSTERIOR REPAIR (RECTOCELE);  Surgeon: Bjorn Loser, MD;  Location: [facility];  Service: Urology;  Laterality: N/A;   bladder tack     BREAST CYST EXCISION     CHOLECYSTECTOMY     CYSTOSCOPY N/A 02/09/2019   Procedure: CYSTOSCOPY;  Surgeon: Bjorn Loser, MD;  Location: [facility];   Service: Urology;  Laterality: N/A;   SKIN CANCER EXCISION N/A    from back    OB History     Gravida  2   Para  2   Term  2   Preterm      AB      Living  2      SAB      IAB      Ectopic      Multiple      Live Births               Home Medications    Prior to Admission medications   Medication Sig Start Date End Date Taking? Authorizing Provider  ciprofloxacin (CIPRO) 250 MG tablet Take 1 tablet (250 mg total) by mouth 2 (two) times daily for 7 days. 11/20/21 11/27/21 Yes Eliezer Lofts, FNP  acetaminophen (TYLENOL) 500 MG tablet Take 1,000 mg by mouth every 6 (six) hours as needed for moderate pain.    [provider]  amLODipine (NORVASC) 5 MG tablet Take 1 tablet by mouth once daily 11/08/21   Samuel Bouche, NP  buPROPion (WELLBUTRIN XL) 150 MG 24 hr tablet Take 1 tablet (150 mg total) by mouth every morning. 03/01/21   Samuel Bouche, NP  cetirizine (ZYRTEC) 10 MG tablet Take 10 mg by mouth daily.    [provider]  ciclopirox (PENLAC) 8 % solution Apply topically at bedtime. Apply over nail and surrounding skin. Apply daily over previous coat. After seven (  7) days, may remove with alcohol and continue cycle. Patient not taking: Reported on 07/23/2021 09/12/20   Emeterio Reeve, DO  clobetasol cream (TEMOVATE) 0.05 % Apply topically. 07/18/21   [provider]  diphenhydrAMINE (BENADRYL) 25 MG tablet Take 25 mg by mouth daily as needed for allergies.    [provider]  ipratropium (ATROVENT) 0.03 % nasal spray Place 2 sprays into both nostrils every 12 (twelve) hours. 06/25/21   Samuel Bouche, NP  levothyroxine (SYNTHROID) 100 MCG tablet Take 1 tablet (100 mcg total) by mouth daily before breakfast. 11/15/21   Samuel Bouche, NP  phenazopyridine (PYRIDIUM) 200 MG tablet Take 1 tablet (200 mg total) by mouth 3 (three) times daily as needed for pain. 10/23/21   Samuel Bouche, NP  primidone (MYSOLINE) 50 MG tablet TAKE 1/2 (ONE-HALF) TABLET  BY MOUTH AT BEDTIME 10/23/21   Samuel Bouche, NP  rosuvastatin (CRESTOR) 20 MG tablet Take 1 tablet by mouth once daily 10/25/21   Samuel Bouche, NP  sertraline (ZOLOFT) 50 MG tablet Take 1 tablet by mouth once daily 11/08/21   Samuel Bouche, NP  sulfamethoxazole-trimethoprim (BACTRIM DS) 800-160 MG tablet Take 1 tablet by mouth 2 (two) times daily. 10/23/21   Samuel Bouche, NP  triamcinolone cream (KENALOG) 0.1 % Apply 1 Application topically 2 (two) times daily. Use for up to 14 days. 10/23/21   Samuel Bouche, NP    Family History Family History  Problem Relation Age of Onset   Alcohol abuse Father    Heart attack Father    Cancer Sister        LUNG   Depression Paternal Grandfather    Dementia Mother    Stroke Mother    Alcohol abuse Son    Cancer Maternal Aunt     Social History Social History   Tobacco Use   Smoking status: Former    Packs/day: 1.00    Years: 15.00    Total pack years: 15.00    Types: Cigarettes    Quit date: 07/03/1993    Years since quitting: 28.4   Smokeless tobacco: Never  Vaping Use   Vaping Use: Never used  Substance Use Topics   Alcohol use: Yes    Alcohol/week: 7.0 standard drinks of alcohol    Types: 7 Glasses of wine per week    Comment: 4 ounces a night   Drug use: No     Allergies   Latex, Cefuroxime, and Valsartan   Review of Systems Review of Systems  Genitourinary:  Positive for dysuria and frequency.  All other systems reviewed and are negative.    Physical Exam Triage Vital Signs ED Triage Vitals  Enc Vitals Group     BP 11/20/21 1747 (!) 157/73     Pulse Rate 11/20/21 1747 66     Resp 11/20/21 1747 14     Temp 11/20/21 1747 97.7 F (36.5 C)     Temp Source 11/20/21 1747 Oral     SpO2 11/20/21 1747 94 %     Weight --      Height --      Head Circumference --      Peak Flow --      Pain Score 11/20/21 1746 10     Pain Loc --      Pain Edu? --      Excl. in [place]? --    No data found.  Updated Vital Signs BP (!) 157/73 (BP  Location: Right Arm)   Pulse 66  Temp 97.7 F (36.5 C) (Oral)   Resp 14   SpO2 94%      Physical Exam Vitals and nursing note reviewed.  Constitutional:      Appearance: Normal appearance.  HENT:     Head: Normocephalic and atraumatic.     Mouth/Throat:     Mouth: Mucous membranes are moist.     Pharynx: Oropharynx is clear.  Eyes:     Extraocular Movements: Extraocular movements intact.     Conjunctiva/sclera: Conjunctivae normal.     Pupils: Pupils are equal, round, and reactive to light.  Cardiovascular:     Rate and Rhythm: Normal rate and regular rhythm.     Pulses: Normal pulses.     Heart sounds: Normal heart sounds. No murmur heard. Pulmonary:     Effort: Pulmonary effort is normal.     Breath sounds: Normal breath sounds. No wheezing, rhonchi or rales.  Abdominal:     Tenderness: There is no right CVA tenderness or left CVA tenderness.  Musculoskeletal:        General: Normal range of motion.     Cervical back: Normal range of motion and neck supple.  Skin:    General: Skin is warm and dry.  Neurological:     General: No focal deficit present.     Mental Status: She is alert and oriented to person, place, and time.      UC Treatments / Results  Labs (all labs ordered are listed, but only abnormal results are displayed) Labs Reviewed  POCT URINALYSIS DIP (MANUAL ENTRY) - Abnormal; Notable for the following components:      Result Value   Color, UA orange (*)    Glucose, UA =100 (*)    Spec Grav, UA <=1.005 (*)    Blood, UA large (*)    Protein Ur, POC =30 (*)    Urobilinogen, UA 2.0 (*)    Nitrite, UA Positive (*)    Leukocytes, UA Large (3+) (*)    All other components within normal limits  URINE CULTURE    EKG   Radiology No results found.  Procedures Procedures (including critical care time)  Medications Ordered in UC Medications - No data to display  Initial Impression / Assessment and Plan / UC Course  I have reviewed the triage  vital signs and the nursing notes.  Pertinent labs & imaging results that were available during my care of the patient were reviewed by me and considered in my medical decision making (see chart for details).     MDM: 1.  Acute cystitis with hematuria-Rx'd Cipro, urine culture ordered. Advised patient to take medication as directed with food to completion.  Encouraged patient increase daily water intake while taking this medication.  Advised patient we will follow-up with urine culture results once received. Advised patient if symptoms worsen and/or unresolved please follow-up with PCP or here for further evaluation.  Patient discharged home, hemodynamically stable. Final Clinical Impressions(s) / UC Diagnoses   Final diagnoses:  Acute cystitis with hematuria     Discharge Instructions      Advised patient to take medication as directed with food to completion.  Encouraged patient increase daily water intake while taking this medication.  Advised patient we will follow-up with urine culture results once received.  Advised patient if symptoms worsen and/or unresolved please follow-up with PCP or here for further evaluation.     ED Prescriptions     Medication Sig Dispense Auth. Provider   ciprofloxacin (CIPRO)  250 MG tablet Take 1 tablet (250 mg total) by mouth 2 (two) times daily for 7 days. 14 tablet Eliezer Lofts, FNP      PDMP not reviewed this encounter.   Eliezer Lofts, [place] 11/20/21 [phone]

## 2021-11-20 NOTE — ED Triage Notes (Signed)
Pt presents with complaints of dysuria that began this afternoon. Pt endorses hx of UTI.

## 2021-11-21 ENCOUNTER — Ambulatory Visit: Payer: Medicare HMO | Admitting: Family Medicine

## 2021-11-21 NOTE — Progress Notes (Deleted)
   Acute Office Visit  Subjective:     Patient ID: Natasha Wilson, female    DOB: Jun 06, 1950, 71 y.o.   MRN: [id number]  No chief complaint on file.   HPI Patient is in today for dysuria.   ROS      Objective:    There were no vitals taken for this visit. {Vitals History (Optional):23777}  Physical Exam  No results found for any visits on 11/21/21.      Assessment & Plan:   Problem List Items Addressed This Visit   None   No orders of the defined types were placed in this encounter.   No follow-ups on file.  Owens Loffler, DO

## 2021-11-23 LAB — URINE CULTURE: Culture: >=100000 — AB

## 2021-11-25 ENCOUNTER — Other Ambulatory Visit: Payer: Self-pay | Admitting: Medical-Surgical

## 2021-11-25 DIAGNOSIS — E785 Hyperlipidemia, unspecified: Secondary | ICD-10-CM

## 2021-12-04 ENCOUNTER — Other Ambulatory Visit: Payer: Self-pay | Admitting: Medical-Surgical

## 2021-12-12 ENCOUNTER — Other Ambulatory Visit: Payer: Self-pay | Admitting: Medical-Surgical

## 2021-12-21 ENCOUNTER — Other Ambulatory Visit: Payer: Self-pay | Admitting: Medical-Surgical

## 2021-12-21 ENCOUNTER — Encounter: Payer: Self-pay | Admitting: Medical-Surgical

## 2021-12-27 ENCOUNTER — Ambulatory Visit (INDEPENDENT_AMBULATORY_CARE_PROVIDER_SITE_OTHER): Payer: Medicare HMO

## 2021-12-27 DIAGNOSIS — Z1231 Encounter for screening mammogram for malignant neoplasm of breast: Secondary | ICD-10-CM | POA: Diagnosis not present

## 2022-01-01 ENCOUNTER — Other Ambulatory Visit: Payer: Self-pay | Admitting: Medical-Surgical

## 2022-01-01 DIAGNOSIS — R946 Abnormal results of thyroid function studies: Secondary | ICD-10-CM | POA: Diagnosis not present

## 2022-01-02 LAB — TSH+FREE T4: TSH W/REFLEX TO FT4: 2.7 mIU/L (ref 0.40–4.50)

## 2022-01-05 ENCOUNTER — Other Ambulatory Visit: Payer: Self-pay | Admitting: Medical-Surgical

## 2022-01-05 DIAGNOSIS — E785 Hyperlipidemia, unspecified: Secondary | ICD-10-CM

## 2022-01-08 DIAGNOSIS — H527 Unspecified disorder of refraction: Secondary | ICD-10-CM | POA: Diagnosis not present

## 2022-01-08 DIAGNOSIS — H18413 Arcus senilis, bilateral: Secondary | ICD-10-CM | POA: Diagnosis not present

## 2022-01-08 DIAGNOSIS — H25811 Combined forms of age-related cataract, right eye: Secondary | ICD-10-CM | POA: Diagnosis not present

## 2022-01-08 DIAGNOSIS — H43812 Vitreous degeneration, left eye: Secondary | ICD-10-CM | POA: Diagnosis not present

## 2022-01-08 DIAGNOSIS — H43821 Vitreomacular adhesion, right eye: Secondary | ICD-10-CM | POA: Diagnosis not present

## 2022-01-08 DIAGNOSIS — H2512 Age-related nuclear cataract, left eye: Secondary | ICD-10-CM | POA: Diagnosis not present

## 2022-01-15 ENCOUNTER — Other Ambulatory Visit: Payer: Self-pay | Admitting: Medical-Surgical

## 2022-01-15 NOTE — Telephone Encounter (Signed)
Called patient to schedule for a 6 month follow up. No answer and no answering machine. Tvt

## 2022-01-21 DIAGNOSIS — H2512 Age-related nuclear cataract, left eye: Secondary | ICD-10-CM | POA: Diagnosis not present

## 2022-01-21 DIAGNOSIS — H25811 Combined forms of age-related cataract, right eye: Secondary | ICD-10-CM | POA: Diagnosis not present

## 2022-01-29 DIAGNOSIS — H2512 Age-related nuclear cataract, left eye: Secondary | ICD-10-CM | POA: Diagnosis not present

## 2022-01-30 ENCOUNTER — Other Ambulatory Visit: Payer: Self-pay | Admitting: Medical-Surgical

## 2022-02-04 DIAGNOSIS — H2512 Age-related nuclear cataract, left eye: Secondary | ICD-10-CM | POA: Diagnosis not present

## 2022-02-04 DIAGNOSIS — H25811 Combined forms of age-related cataract, right eye: Secondary | ICD-10-CM | POA: Diagnosis not present

## 2022-02-04 DIAGNOSIS — H25812 Combined forms of age-related cataract, left eye: Secondary | ICD-10-CM | POA: Diagnosis not present

## 2022-03-07 DIAGNOSIS — H524 Presbyopia: Secondary | ICD-10-CM | POA: Diagnosis not present

## 2022-03-09 ENCOUNTER — Other Ambulatory Visit: Payer: Self-pay | Admitting: Medical-Surgical

## 2022-04-01 ENCOUNTER — Ambulatory Visit: Payer: Medicare HMO | Admitting: Medical-Surgical

## 2022-04-05 ENCOUNTER — Other Ambulatory Visit: Payer: Self-pay | Admitting: Medical-Surgical

## 2022-04-05 DIAGNOSIS — E785 Hyperlipidemia, unspecified: Secondary | ICD-10-CM

## 2022-04-08 ENCOUNTER — Other Ambulatory Visit: Payer: Self-pay | Admitting: Medical-Surgical

## 2022-04-12 ENCOUNTER — Other Ambulatory Visit: Payer: Self-pay | Admitting: Medical-Surgical

## 2022-04-16 ENCOUNTER — Encounter: Payer: Self-pay | Admitting: Medical-Surgical

## 2022-04-16 ENCOUNTER — Ambulatory Visit (INDEPENDENT_AMBULATORY_CARE_PROVIDER_SITE_OTHER): Payer: Medicare HMO

## 2022-04-16 ENCOUNTER — Ambulatory Visit (INDEPENDENT_AMBULATORY_CARE_PROVIDER_SITE_OTHER): Payer: Medicare HMO | Admitting: Medical-Surgical

## 2022-04-16 VITALS — BP 112/67 | HR 60 | Resp 20 | Ht 62.0 in | Wt 159.4 lb

## 2022-04-16 DIAGNOSIS — Z8669 Personal history of other diseases of the nervous system and sense organs: Secondary | ICD-10-CM

## 2022-04-16 DIAGNOSIS — F411 Generalized anxiety disorder: Secondary | ICD-10-CM | POA: Diagnosis not present

## 2022-04-16 DIAGNOSIS — Z62820 Parent-biological child conflict: Secondary | ICD-10-CM

## 2022-04-16 DIAGNOSIS — M79671 Pain in right foot: Secondary | ICD-10-CM

## 2022-04-16 DIAGNOSIS — E663 Overweight: Secondary | ICD-10-CM

## 2022-04-16 DIAGNOSIS — S40812A Abrasion of left upper arm, initial encounter: Secondary | ICD-10-CM

## 2022-04-16 NOTE — Progress Notes (Signed)
Established Patient Office Visit  Subjective   Patient ID: Natasha Wilson, female   DOB: [birth year] Age: 72 y.o. MRN: [id number]   Chief Complaint  Patient presents with   Follow-up   HPI Pleasant 72 year old female presenting today for the following:  Mood: Notes that she is taking Zoloft 50 mg daily, tolerating well without side effects.  Feels that her overall symptoms are not too bad right now and is happy with the medication effects.  She does still have quite a bit of anxiety and admits that if she does something wrong or something that she perceives is wrong, she tends to have difficulty with managing this and would "beat herself up about it" for several days before realizing that it was really not a big deal after all.  This has been a lifelong behavior and is difficult to manage sometimes.  That she is still having issues with her son which contributes to her mood concerns.  Denies SI/HI.  Essential tremor: Taking primidone 25 mg daily but reports that this is not very helpful.  She still has tremors of her hands as well as her head/neck.  Has been having right heel pain for a while.  Has a knot on the back of her right heel and notes that there are times when it hurts bad enough that taking her shoes off to walk is extremely painful.  Notes that she does not wear shoes at home and if it is nice outside, she will walk around the yard barefoot.  Has not had any imaging completed and has not found anything that makes the pain better outside of rest.  Reports that she had an injury to her left forearm a couple of weeks ago.  She was walking and talking to her friend when she struck her arm on a wooden post.  There was a large area of broken skin and quite a bit of bleeding but this has been healing very well.  She notes that it is still swollen in the area and wonders if this is normal.  Weight concerns: Notes that up until 10 years ago, she weighed about 132 pounds.  She is now at a stage  where she is wanting to try and lose weight.  She does exercise regularly doing cardiovascular and strength training activities.  She also does some volunteer work.  She is unsure where to start to help with weight loss at this point.  Her goal weight is 140-145 pounds.   Objective:    Vitals:   04/16/22 1355  BP: 112/67  Pulse: 60  Resp: 20  Height: 5' 2"$  (1.575 m)  Weight: 159 lb 6.4 oz (72.3 kg)  SpO2: 97%  BMI (Calculated): 29.15    Physical Exam Vitals reviewed.  Constitutional:      General: She is not in acute distress.    Appearance: Normal appearance. She is not ill-appearing.  HENT:     Head: Normocephalic and atraumatic.  Cardiovascular:     Rate and Rhythm: Normal rate and regular rhythm.     Pulses: Normal pulses.     Heart sounds: Normal heart sounds.  Pulmonary:     Effort: Pulmonary effort is normal. No respiratory distress.     Breath sounds: Normal breath sounds. No wheezing, rhonchi or rales.  Skin:    General: Skin is warm and dry.       Neurological:     Mental Status: She is alert and oriented to person, place, and  time.  Psychiatric:        Mood and Affect: Mood normal.        Behavior: Behavior normal.        Thought Content: Thought content normal.        Judgment: Judgment normal.   No results found for this or any previous visit (from the past 24 hour(s)).     The 10-year ASCVD risk score (Arnett DK, et al., 2019) is: 9.8%   Values used to calculate the score:     Age: 68 years     Sex: Female     Is Non-Hispanic African American: No     Diabetic: No     Tobacco smoker: No     Systolic Blood Pressure: [id number] mmHg     Is BP treated: Yes     HDL Cholesterol: 64 mg/dL     Total Cholesterol: 146 mg/dL   Assessment & Plan:   1. Pain of right heel Getting x-rays of the right foot today for further evaluation.  Suspect possible heel spur versus plantar fasciitis.  Recommend wearing supportive shoes when up and moving around even when  inside.  Consider topical treatments with lidocaine patches, Voltaren gel, or over-the-counter analgesics. - DG Foot Complete Right; Future  2. Generalized anxiety disorder 3. Relationship problem between parent and child Unfortunately her relationship with her son does play a large role in her overall mood.  For now, she feels the symptoms are fairly well-managed so continue sertraline 50 mg daily.  4. History of benign essential tremor Symptoms not well-managed on primidone 25 mg daily.  Increasing dose to 50 mg daily.  5. Abrasion of left upper extremity, initial encounter Healing well.  Suspect subcutaneous edema is related to blunt trauma and will self resolve over the next couple weeks.  No signs of infection.  6. Overweight Discussed recommendations to help with weight loss.  Would like for her to get a food scale and work on weight and her portions.  Recommend logging her portions and all food intake into an app to keep track of calories as well as macros.  Continue regular intentional exercise.  Return in about 6 months (around 10/15/2022) for chronic disease follow up.  ___________________________________________ Clearnce Sorrel, DNP, APRN, FNP-BC Primary Care and [place]

## 2022-04-18 ENCOUNTER — Encounter: Payer: Self-pay | Admitting: Medical-Surgical

## 2022-04-18 MED ORDER — PRIMIDONE 50 MG PO TABS: ORAL_TABLET | ORAL | 1 refills | Status: DC

## 2022-04-18 NOTE — Telephone Encounter (Signed)
I have called [facility] and changed the x-ray to a stat order.

## 2022-04-19 ENCOUNTER — Encounter: Payer: Self-pay | Admitting: Medical-Surgical

## 2022-04-22 ENCOUNTER — Encounter: Payer: Medicare HMO | Admitting: Medical-Surgical

## 2022-04-22 NOTE — Progress Notes (Unsigned)
   Established Patient Office Visit  Subjective   Patient ID: Natasha Wilson, female   DOB: [birth year] Age: 72 y.o. MRN: [id number]   No chief complaint on file.   HPI    Objective:    There were no vitals filed for this visit.  Physical Exam   No results found for this or any previous visit (from the past 24 hour(s)).   {Labs (Optional):23779}  The 10-year ASCVD risk score (Arnett DK, et al., 2019) is: 9.8%   Values used to calculate the score:     Age: 89 years     Sex: Female     Is Non-Hispanic African American: No     Diabetic: No     Tobacco smoker: No     Systolic Blood Pressure: [id number] mmHg     Is BP treated: Yes     HDL Cholesterol: 64 mg/dL     Total Cholesterol: 146 mg/dL   Assessment & Plan:   No problem-specific Assessment & Plan notes found for this encounter.   No follow-ups on file.  ___________________________________________ Clearnce Sorrel, DNP, APRN, FNP-BC Primary Care and [place]

## 2022-04-29 DIAGNOSIS — C44319 Basal cell carcinoma of skin of other parts of face: Secondary | ICD-10-CM | POA: Diagnosis not present

## 2022-05-01 ENCOUNTER — Other Ambulatory Visit: Payer: Self-pay | Admitting: Medical-Surgical

## 2022-05-01 ENCOUNTER — Encounter: Payer: Self-pay | Admitting: Medical-Surgical

## 2022-05-07 ENCOUNTER — Other Ambulatory Visit: Payer: Self-pay | Admitting: Medical-Surgical

## 2022-05-09 ENCOUNTER — Other Ambulatory Visit: Payer: Self-pay | Admitting: Medical-Surgical

## 2022-05-28 ENCOUNTER — Ambulatory Visit (INDEPENDENT_AMBULATORY_CARE_PROVIDER_SITE_OTHER): Payer: Medicare HMO | Admitting: Sports Medicine

## 2022-05-28 DIAGNOSIS — M7661 Achilles tendinitis, right leg: Secondary | ICD-10-CM | POA: Diagnosis not present

## 2022-05-28 MED ORDER — NITROGLYCERIN 0.2 MG/HR TD PT24: MEDICATED_PATCH | TRANSDERMAL | 11 refills | Status: DC

## 2022-05-28 NOTE — Assessment & Plan Note (Signed)
Pleasant 72 year old female, she has had several weeks of increasing pain at right posterior heel at the Achilles insertion. No trauma. On exam she has severe tenderness to palpation at the Achilles insertion and the right calcaneal bursa, negative Thompson's test. As she is in such pain we will immobilize her in a cam boot for 2 weeks with a heel lift. Adding topical nitroglycerin, formal physical therapy. Return to see me in 6 weeks.

## 2022-05-28 NOTE — Progress Notes (Signed)
    Procedures performed today:    None.  Independent interpretation of notes and tests performed by another provider:   None.  Brief History, Exam, Impression, and Recommendations:    Achilles tendinitis, insertional, right with Haglund's deformity Pleasant 72 year old female, she has had several weeks of increasing pain at right posterior heel at the Achilles insertion. No trauma. On exam she has severe tenderness to palpation at the Achilles insertion and the right calcaneal bursa, negative Thompson's test. As she is in such pain we will immobilize her in a cam boot for 2 weeks with a heel lift. Adding topical nitroglycerin, formal physical therapy. Return to see me in 6 weeks.    ____________________________________________ Gwen Her. Dianah Field, M.D., ABFM., CAQSM., AME. Primary Care and Sports Medicine [facility]  Adjunct Professor of [employer]  Risk manager

## 2022-06-02 ENCOUNTER — Other Ambulatory Visit: Payer: Self-pay | Admitting: Medical-Surgical

## 2022-06-05 ENCOUNTER — Encounter: Payer: Self-pay | Admitting: Medical-Surgical

## 2022-06-05 ENCOUNTER — Other Ambulatory Visit: Payer: Self-pay | Admitting: Medical-Surgical

## 2022-06-05 MED ORDER — PRIMIDONE 50 MG PO TABS: 50.0000 mg | ORAL_TABLET | Freq: Every day | ORAL | 1 refills | Status: DC

## 2022-06-07 ENCOUNTER — Other Ambulatory Visit: Payer: Self-pay | Admitting: Medical-Surgical

## 2022-06-11 ENCOUNTER — Other Ambulatory Visit: Payer: Self-pay

## 2022-06-11 ENCOUNTER — Ambulatory Visit: Payer: Medicare HMO | Attending: Sports Medicine | Admitting: Physical Therapy

## 2022-06-11 ENCOUNTER — Encounter: Payer: Self-pay | Admitting: Physical Therapy

## 2022-06-11 DIAGNOSIS — M6281 Muscle weakness (generalized): Secondary | ICD-10-CM | POA: Diagnosis present

## 2022-06-11 DIAGNOSIS — M25571 Pain in right ankle and joints of right foot: Secondary | ICD-10-CM | POA: Diagnosis present

## 2022-06-11 DIAGNOSIS — M7661 Achilles tendinitis, right leg: Secondary | ICD-10-CM | POA: Diagnosis not present

## 2022-06-11 DIAGNOSIS — R262 Difficulty in walking, not elsewhere classified: Secondary | ICD-10-CM | POA: Diagnosis present

## 2022-06-11 NOTE — Therapy (Signed)
OUTPATIENT PHYSICAL THERAPY LOWER EXTREMITY EVALUATION   Patient Name: Natasha Wilson MRN: [id number] DOB:11-Sep-1950, 72 y.o., female Today's Date: 06/11/2022  END OF SESSION:  PT End of Session - 06/11/22 1444     Visit Number 1    Number of Visits 12    Date for PT Re-Evaluation 07/23/22    Authorization Type [employer]    Authorization - Visit Number 1    Progress Note Due on Visit 10    PT Start Time 1400    PT Stop Time 1440    PT Time Calculation (min) 40 min    Activity Tolerance Patient tolerated treatment well    Behavior During Therapy [facility] for tasks assessed/performed             Past Medical History:  Diagnosis Date   Allergy 6years   eczema   Depression    sometime   Generalized anxiety disorder 05/24/2016   GERD (gastroesophageal reflux disease)    History of benign essential tremor 10/24/2015   Panic attacks 03/15/2020   Panic attacks    Skin cancer    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   Thyroid activity decreased 11/10/2015   Past Surgical History:  Procedure Laterality Date   ABDOMINAL HYSTERECTOMY     ANTERIOR AND POSTERIOR REPAIR N/A 02/09/2019   Procedure: ANTERIOR (CYSTOCELE) AND POSTERIOR REPAIR (RECTOCELE);  Surgeon: Alfredo Martinez, MD;  Location: [facility];  Service: Urology;  Laterality: N/A;   bladder tack     BREAST CYST EXCISION     CHOLECYSTECTOMY     CYSTOSCOPY N/A 02/09/2019   Procedure: CYSTOSCOPY;  Surgeon: Alfredo Martinez, MD;  Location: [facility];  Service: Urology;  Laterality: N/A;   SKIN CANCER EXCISION N/A    from back   Patient Active Problem List   Diagnosis Date Noted   Achilles tendinitis, insertional, right with Haglund's deformity 05/28/2022   Atopic dermatitis 10/18/2020   Lumbar degenerative disc disease 09/19/2020   Cystocele with prolapse 02/09/2019   Postmenopausal atrophic vaginitis 08/11/2018   Prolapse of anterior vaginal wall 08/11/2018   Posterior vaginal wall prolapse 08/11/2018   Onychomycosis 11/01/2016    History of chronic eczema 11/01/2016   Generalized anxiety disorder 05/24/2016   S/P colonoscopy 12/20/2015   Thyroid activity decreased 11/10/2015   Relationship problem between parent and child 10/24/2015   Annual physical exam 10/24/2015   History of benign essential tremor 10/24/2015   History of hysterectomy for benign disease 10/24/2015    PCP: Larinda Buttery  REFERRING PROVIDER: Velva Harman  REFERRING DIAG: achilles tendonitis  THERAPY DIAG:  Pain in right ankle and joints of right foot  Muscle weakness (generalized)  Difficulty in walking, not elsewhere classified  Rationale for Evaluation and Treatment: Rehabilitation  ONSET DATE: 05/2022  SUBJECTIVE:   SUBJECTIVE STATEMENT: Pt states that her Rt ankle in the achilles are has been bothering her for "about a month". She states it gradually got worse and she went to the MD about 2 weeks ago and prescribed a boot which she has been wearing for 2 weeks. She states MD told her to only wear boot for 2 weeks so she is back in regular shoes. Ankle hurts with prolonged standing and with stair negotiation, achilles is tender to touch. Pt states pain decreased with nitro patches.   PERTINENT HISTORY: None reported PAIN:  Are you having pain? Yes: NPRS scale: 1/10 at rest, 6/10 with walking and standing/10 Pain location: Rt achilles Pain description: tender, sore Aggravating factors: prolonged standing or walking  Relieving factors: nitro patches, elevation/rest  PRECAUTIONS: None  WEIGHT BEARING RESTRICTIONS: No  FALLS:  Has patient fallen in last 6 months? No  LIVING ENVIRONMENT:  Lives in: House/apartment Stairs: Yes: External: 12 steps; on right going up   OCCUPATION: retired, enjoys silver sneakers aerobics and volunteering with her church  PLOF: Independent  PATIENT GOALS: decrease pain and be able to "walk more normally", return to silver sneakers exercise class  NEXT MD VISIT: May 2024  OBJECTIVE:     PATIENT SURVEYS:  FOTO 34   EDEMA:  Increased edema Rt achilles insertion   PALPATION: TTP Rt achilles insertion Rt ankle and foot jt mobility WFL Pain with PROM into DF on Rt  LOWER EXTREMITY ROM:  Active ROM Right eval Left eval  Hip flexion    Hip extension    Hip abduction    Hip adduction    Hip internal rotation    Hip external rotation    Knee flexion    Knee extension    Ankle dorsiflexion 1 10  Ankle plantarflexion 65 pain 65  Ankle inversion 45 50  Ankle eversion 15 pain 15   (Blank rows = not tested)  LOWER EXTREMITY MMT:  MMT Right eval Left eval  Hip flexion    Hip extension    Hip abduction    Hip adduction    Hip internal rotation    Hip external rotation    Knee flexion    Knee extension    Ankle dorsiflexion 4  4+  Ankle plantarflexion 4 pain 4+  Ankle inversion 4 4+  Ankle eversion 4- 4   (Blank rows = not tested)   FUNCTIONAL TESTS:  SLS Rt 7 sec, Lt 8 sec no c/o pain  GAIT: Distance walked: 110' Comments: antalgic gait with decreased stance time on Rt   TODAY'S TREATMENT:                                                                                                                              [facility] Adult PT Treatment:                                                DATE: 06/11/22 Therapeutic Exercise: See HEP  Modalities: Ionto 1mL dexamethasone 4 hour patch      PATIENT EDUCATION:  Education details: PT POC and goals, HEP, ionto Person educated: Patient Education method: Explanation, Demonstration, and Handouts Education comprehension: verbalized understanding and returned demonstration  HOME EXERCISE PROGRAM: Access Code: [email or web address] URL: [email or web address] Date: 06/11/2022 Prepared by: Reggy EyeKaren Eliyah Bazzi  Exercises - Long Sitting Ankle Dorsiflexion with Anchored Resistance  - 1 x daily - 7 x weekly - 3 sets - 10 reps - Long Sitting Ankle Plantar Flexion with Resistance  - 1 x daily - 7 x weekly  - 3 sets -  10 reps - Long Sitting Ankle Eversion with Resistance  - 1 x daily - 7 x weekly - 3 sets - 10 reps - Long Sitting Ankle Inversion with Resistance  - 1 x daily - 7 x weekly - 3 sets - 10 reps - Standing Single Leg Stance with Counter Support  - 1 x daily - 7 x weekly - 1 sets - 3 reps - 20-30 seconds hold  ASSESSMENT:  CLINICAL IMPRESSION: Patient is a 72 y.o. female who was seen today for physical therapy evaluation and treatment for Rt achilles tendonitis. She presents with difficulty walking, decreased strength, ROM and balance, decreased functional activity tolerance and increased pain. She will benefit from skilled PT to address deficits and improve functional mobility with decreased pain.   OBJECTIVE IMPAIRMENTS: decreased balance, difficulty walking, decreased ROM, decreased strength, increased edema, and pain.   ACTIVITY LIMITATIONS: standing, stairs, and locomotion level  PARTICIPATION LIMITATIONS: community activity  PERSONAL FACTORS: Time since onset of injury/illness/exacerbation are also affecting patient's functional outcome.   REHAB POTENTIAL: Good  CLINICAL DECISION MAKING: Evolving/moderate complexity  EVALUATION COMPLEXITY: Moderate   GOALS: Goals reviewed with patient? Yes  SHORT TERM GOALS: Target date: 06/25/2022   Pt will be independent with initial HEP Baseline: Goal status: INITIAL    LONG TERM GOALS: Target date: 07/23/2022    Pt will be independent with advanced HEP Baseline:  Goal status: INITIAL  2.  Pt will improve FOTO to >= 61 to demo improved functional mobility Baseline:  Goal status: INITIAL  3.  Pt will improve Rt ankle strength to 4+/5 to tolerate standing and walking with decreased pain Baseline:  Goal status: INITIAL  4.  Pt will return to full silver sneakers routine with pain <= 2/10 Baseline:  Goal status: INITIAL    PLAN:  PT FREQUENCY: 2x/week  PT DURATION: 6 weeks  PLANNED INTERVENTIONS: Therapeutic  exercises, Therapeutic activity, Neuromuscular re-education, Balance training, Gait training, Patient/Family education, Self Care, Joint mobilization, Aquatic Therapy, Dry Needling, Electrical stimulation, Cryotherapy, Moist heat, Taping, Vasopneumatic device, Ultrasound, Ionotophoresis 4mg /ml Dexamethasone, Manual therapy, and Re-evaluation  PLAN FOR NEXT SESSION: assess response to HEP, ankle strength and mobility, pain control   Maura Braaten, PT 06/11/2022, 2:44 PM

## 2022-06-14 ENCOUNTER — Ambulatory Visit (INDEPENDENT_AMBULATORY_CARE_PROVIDER_SITE_OTHER): Payer: Medicare HMO | Admitting: Medical-Surgical

## 2022-06-14 ENCOUNTER — Other Ambulatory Visit: Payer: Self-pay | Admitting: Medical-Surgical

## 2022-06-14 ENCOUNTER — Encounter: Payer: Self-pay | Admitting: Medical-Surgical

## 2022-06-14 VITALS — BP 133/71 | HR 64 | Resp 20 | Ht 62.0 in | Wt 161.6 lb

## 2022-06-14 DIAGNOSIS — B001 Herpesviral vesicular dermatitis: Secondary | ICD-10-CM

## 2022-06-14 MED ORDER — VALACYCLOVIR HCL 1 G PO TABS: 2000.0000 mg | ORAL_TABLET | Freq: Two times a day (BID) | ORAL | 2 refills | Status: DC | PRN

## 2022-06-14 MED ORDER — SERTRALINE HCL 50 MG PO TABS: 50.0000 mg | ORAL_TABLET | Freq: Every day | ORAL | 3 refills | Status: DC

## 2022-06-14 NOTE — Progress Notes (Signed)
        Established patient visit  History, exam, impression, and plan:  1. Cold sore History of oral cold sores.  Reports 1 popped up on her right upper lip in March and she has been treating with Abreva, lip balm, and Polysporin.  The area has healed for the most part and is now currently scabbed.  Unfortunately, she cannot get it to completely go away.  Whenever she eats, the scab gets broken and the area will start to bleed again.  On exam, a small 2 mm black scab noted to the right upper lip without significant erythema, swelling, or drainage.  Suspect the sore is still in place due to repeated trauma with meals.  Advised to discontinue Polysporin and Abreva.  We will go ahead and treat with Valtrex 2000 mg twice daily as needed as she is still having some tingling in the area periodically.  Okay to use lip balm to keep the area moisturized.  Discussed possibly using a small dab of liquid Band-Aid to help protect the scab from dislodgment but will leave this to her judgment.  Procedures performed this visit: None.  Return if symptoms worsen or fail to improve.  __________________________________ Thayer Ohm, DNP, APRN, FNP-BC Primary Care and Sports Medicine [facility] [place]

## 2022-06-19 ENCOUNTER — Encounter: Payer: Self-pay | Admitting: Physical Therapy

## 2022-06-19 ENCOUNTER — Ambulatory Visit: Payer: Medicare HMO | Admitting: Physical Therapy

## 2022-06-19 DIAGNOSIS — M25571 Pain in right ankle and joints of right foot: Secondary | ICD-10-CM

## 2022-06-19 DIAGNOSIS — M6281 Muscle weakness (generalized): Secondary | ICD-10-CM

## 2022-06-19 DIAGNOSIS — R262 Difficulty in walking, not elsewhere classified: Secondary | ICD-10-CM | POA: Diagnosis not present

## 2022-06-19 DIAGNOSIS — M7661 Achilles tendinitis, right leg: Secondary | ICD-10-CM | POA: Diagnosis not present

## 2022-06-19 NOTE — Therapy (Signed)
OUTPATIENT PHYSICAL THERAPY   Patient Name: Natasha Wilson MRN: [id number] DOB:1950-09-16, 72 y.o., female Today's Date: 06/19/2022  END OF SESSION:  PT End of Session - 06/19/22 1607     Visit Number 2    Number of Visits 12    Date for PT Re-Evaluation 07/23/22    Authorization Type [employer]    Authorization - Visit Number 2    Progress Note Due on Visit 10    PT Start Time 1530    PT Stop Time 1608    PT Time Calculation (min) 38 min    Activity Tolerance Patient tolerated treatment well    Behavior During Therapy [facility] for tasks assessed/performed              Past Medical History:  Diagnosis Date   Allergy 6years   eczema   Depression    sometime   Generalized anxiety disorder 05/24/2016   GERD (gastroesophageal reflux disease)    History of benign essential tremor 10/24/2015   Panic attacks 03/15/2020   Panic attacks    Skin cancer    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   Thyroid activity decreased 11/10/2015   Past Surgical History:  Procedure Laterality Date   ABDOMINAL HYSTERECTOMY     ANTERIOR AND POSTERIOR REPAIR N/A 02/09/2019   Procedure: ANTERIOR (CYSTOCELE) AND POSTERIOR REPAIR (RECTOCELE);  Surgeon: Alfredo Martinez, MD;  Location: [facility];  Service: Urology;  Laterality: N/A;   bladder tack     BREAST CYST EXCISION     CHOLECYSTECTOMY     CYSTOSCOPY N/A 02/09/2019   Procedure: CYSTOSCOPY;  Surgeon: Alfredo Martinez, MD;  Location: [facility];  Service: Urology;  Laterality: N/A;   SKIN CANCER EXCISION N/A    from back   Patient Active Problem List   Diagnosis Date Noted   Achilles tendinitis, insertional, right with Haglund's deformity 05/28/2022   Atopic dermatitis 10/18/2020   Lumbar degenerative disc disease 09/19/2020   Cystocele with prolapse 02/09/2019   Postmenopausal atrophic vaginitis 08/11/2018   Prolapse of anterior vaginal wall 08/11/2018   Posterior vaginal wall prolapse 08/11/2018   Onychomycosis 11/01/2016   History of chronic eczema  11/01/2016   Generalized anxiety disorder 05/24/2016   S/P colonoscopy 12/20/2015   Thyroid activity decreased 11/10/2015   Relationship problem between parent and child 10/24/2015   Annual physical exam 10/24/2015   History of benign essential tremor 10/24/2015   History of hysterectomy for benign disease 10/24/2015    PCP: Larinda Buttery  REFERRING PROVIDER: Velva Harman  REFERRING DIAG: achilles tendonitis  THERAPY DIAG:  Pain in right ankle and joints of right foot  Muscle weakness (generalized)  Difficulty in walking, not elsewhere classified  Rationale for Evaluation and Treatment: Rehabilitation  ONSET DATE: 05/2022  SUBJECTIVE:   SUBJECTIVE STATEMENT: Pt was able to go to silver sneakers class this morning with minimal increase in pain. She states she was able to do all but the aerobic portion of class and elevated her leg after class to reduce soreness  PERTINENT HISTORY: None reported PAIN:  Are you having pain? Yes: NPRS scale: 1/10 at rest, 6/10 with walking and standing/10 Pain location: Rt achilles Pain description: tender, sore Aggravating factors: prolonged standing or walking Relieving factors: nitro patches, elevation/rest  PRECAUTIONS: None  WEIGHT BEARING RESTRICTIONS: No  FALLS:  Has patient fallen in last 6 months? No  LIVING ENVIRONMENT:  Lives in: House/apartment Stairs: Yes: External: 12 steps; on right going up   OCCUPATION: retired, enjoys silver sneakers aerobics and volunteering  with her church  PLOF: Independent  PATIENT GOALS: decrease pain and be able to "walk more normally", return to silver sneakers exercise class  NEXT MD VISIT: May 2024  OBJECTIVE:    PALPATION: TTP Rt achilles insertion Rt ankle and foot jt mobility WFL Pain with PROM into DF on Rt  LOWER EXTREMITY ROM:  Active ROM Right eval Left eval  Hip flexion    Hip extension    Hip abduction    Hip adduction    Hip internal rotation    Hip external  rotation    Knee flexion    Knee extension    Ankle dorsiflexion 1 10  Ankle plantarflexion 65 pain 65  Ankle inversion 45 50  Ankle eversion 15 pain 15   (Blank rows = not tested)  LOWER EXTREMITY MMT:  MMT Right eval Left eval  Hip flexion    Hip extension    Hip abduction    Hip adduction    Hip internal rotation    Hip external rotation    Knee flexion    Knee extension    Ankle dorsiflexion 4  4+  Ankle plantarflexion 4 pain 4+  Ankle inversion 4 4+  Ankle eversion 4- 4   (Blank rows = not tested)   FUNCTIONAL TESTS:  SLS Rt 7 sec, Lt 8 sec no c/o pain  GAIT: Distance walked: 110' Comments: antalgic gait with decreased stance time on Rt   TODAY'S TREATMENT:                                                                                                                              [facility] Adult PT Treatment:                                                DATE: 06/19/22 Therapeutic Exercise: Recumbent bike L2 x 5 min warm up Gastroc stretch off step x 30 sec Soleus stretch x 30 sec SLS intermittent UE support 2 x 30 sec Tandem stance 2 x 30 sec on foam Ankle 4 way green TB x 20 BAPS level 2 x 1 min each CW/CCW Heel raise with eccentric lowering 2 x 10   Modalities: Ionto 1mL dexamethasone 4 hour patch    [facility] Adult PT Treatment:                                                DATE: 06/11/22 Therapeutic Exercise: See HEP  Modalities: Ionto 1mL dexamethasone 4 hour patch      PATIENT EDUCATION:  Education details: PT POC and goals, HEP, ionto Person educated: Patient Education method: Explanation, Demonstration, and Handouts Education comprehension: verbalized understanding and returned demonstration  HOME EXERCISE PROGRAM: Access Code: [email or web address] URL: [email or web address] Date: 06/11/2022 Prepared by: Reggy Eye  Exercises - Long Sitting Ankle Dorsiflexion with Anchored Resistance  - 1 x daily - 7 x weekly - 3 sets - 10  reps - Long Sitting Ankle Plantar Flexion with Resistance  - 1 x daily - 7 x weekly - 3 sets - 10 reps - Long Sitting Ankle Eversion with Resistance  - 1 x daily - 7 x weekly - 3 sets - 10 reps - Long Sitting Ankle Inversion with Resistance  - 1 x daily - 7 x weekly - 3 sets - 10 reps - Standing Single Leg Stance with Counter Support  - 1 x daily - 7 x weekly - 1 sets - 3 reps - 20-30 seconds hold  ASSESSMENT:  CLINICAL IMPRESSION: Pt with good response to progression of exercises. Challenged by balance on foam. Continued ionto as pt reports good response to first patch  OBJECTIVE IMPAIRMENTS: decreased balance, difficulty walking, decreased ROM, decreased strength, increased edema, and pain.     GOALS: Goals reviewed with patient? Yes  SHORT TERM GOALS: Target date: 06/25/2022   Pt will be independent with initial HEP Baseline: Goal status: INITIAL    LONG TERM GOALS: Target date: 07/23/2022    Pt will be independent with advanced HEP Baseline:  Goal status: INITIAL  2.  Pt will improve FOTO to >= 61 to demo improved functional mobility Baseline:  Goal status: INITIAL  3.  Pt will improve Rt ankle strength to 4+/5 to tolerate standing and walking with decreased pain Baseline:  Goal status: INITIAL  4.  Pt will return to full silver sneakers routine with pain <= 2/10 Baseline:  Goal status: INITIAL    PLAN:  PT FREQUENCY: 2x/week  PT DURATION: 6 weeks  PLANNED INTERVENTIONS: Therapeutic exercises, Therapeutic activity, Neuromuscular re-education, Balance training, Gait training, Patient/Family education, Self Care, Joint mobilization, Aquatic Therapy, Dry Needling, Electrical stimulation, Cryotherapy, Moist heat, Taping, Vasopneumatic device, Ultrasound, Ionotophoresis /ml Dexamethasone, Manual therapy, and Re-evaluation  PLAN FOR NEXT SESSION: progress standing heel raises, ionto, ankle strength and mobility, pain control   Telly Jawad, PT 06/19/2022,  4:07 PM

## 2022-06-26 ENCOUNTER — Ambulatory Visit: Payer: Medicare HMO | Admitting: Physical Therapy

## 2022-06-26 ENCOUNTER — Encounter: Payer: Self-pay | Admitting: Physical Therapy

## 2022-06-26 DIAGNOSIS — M25571 Pain in right ankle and joints of right foot: Secondary | ICD-10-CM

## 2022-06-26 DIAGNOSIS — R262 Difficulty in walking, not elsewhere classified: Secondary | ICD-10-CM | POA: Diagnosis not present

## 2022-06-26 DIAGNOSIS — M7661 Achilles tendinitis, right leg: Secondary | ICD-10-CM | POA: Diagnosis not present

## 2022-06-26 DIAGNOSIS — M6281 Muscle weakness (generalized): Secondary | ICD-10-CM | POA: Diagnosis not present

## 2022-06-26 NOTE — Therapy (Signed)
OUTPATIENT PHYSICAL THERAPY   Patient Name: Natasha Wilson MRN: [id number] DOB:11/01/1950, 72 y.o., female Today's Date: 06/26/2022  END OF SESSION:  PT End of Session - 06/26/22 1502     Visit Number 3    Number of Visits 12    Date for PT Re-Evaluation 07/23/22    Authorization Time Period through 5/21    Authorization - Visit Number 3    Progress Note Due on Visit 10    PT Start Time 1445    PT Stop Time 1530    PT Time Calculation (min) 45 min    Activity Tolerance Patient tolerated treatment well    Behavior During Therapy [facility] for tasks assessed/performed               Past Medical History:  Diagnosis Date   Allergy 6years   eczema   Depression    sometime   Generalized anxiety disorder 05/24/2016   GERD (gastroesophageal reflux disease)    History of benign essential tremor 10/24/2015   Panic attacks 03/15/2020   Panic attacks    Skin cancer    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   Thyroid activity decreased 11/10/2015   Past Surgical History:  Procedure Laterality Date   ABDOMINAL HYSTERECTOMY     ANTERIOR AND POSTERIOR REPAIR N/A 02/09/2019   Procedure: ANTERIOR (CYSTOCELE) AND POSTERIOR REPAIR (RECTOCELE);  Surgeon: Alfredo Martinez, MD;  Location: [facility];  Service: Urology;  Laterality: N/A;   bladder tack     BREAST CYST EXCISION     CHOLECYSTECTOMY     CYSTOSCOPY N/A 02/09/2019   Procedure: CYSTOSCOPY;  Surgeon: Alfredo Martinez, MD;  Location: [facility];  Service: Urology;  Laterality: N/A;   SKIN CANCER EXCISION N/A    from back   Patient Active Problem List   Diagnosis Date Noted   Achilles tendinitis, insertional, right with Haglund's deformity 05/28/2022   Atopic dermatitis 10/18/2020   Lumbar degenerative disc disease 09/19/2020   Cystocele with prolapse 02/09/2019   Postmenopausal atrophic vaginitis 08/11/2018   Prolapse of anterior vaginal wall 08/11/2018   Posterior vaginal wall prolapse 08/11/2018   Onychomycosis 11/01/2016   History of chronic  eczema 11/01/2016   Generalized anxiety disorder 05/24/2016   S/P colonoscopy 12/20/2015   Thyroid activity decreased 11/10/2015   Relationship problem between parent and child 10/24/2015   Annual physical exam 10/24/2015   History of benign essential tremor 10/24/2015   History of hysterectomy for benign disease 10/24/2015    PCP: Larinda Buttery  REFERRING PROVIDER: Velva Harman  REFERRING DIAG: achilles tendonitis  THERAPY DIAG:  Pain in right ankle and joints of right foot  Muscle weakness (generalized)  Difficulty in walking, not elsewhere classified  Rationale for Evaluation and Treatment: Rehabilitation  ONSET DATE: 05/2022  SUBJECTIVE:   SUBJECTIVE STATEMENT: Pt reports she had more pain this morning when she woke up. No change of activity that she can think of. She still went to silver sneakers class this morning  PERTINENT HISTORY: None reported PAIN:  Are you having pain? Yes: NPRS scale: 3/10 at rest, 6/10 with walking and standing/10 Pain location: Rt achilles Pain description: tender, sore Aggravating factors: prolonged standing or walking Relieving factors: nitro patches, elevation/rest  PRECAUTIONS: None  WEIGHT BEARING RESTRICTIONS: No  FALLS:  Has patient fallen in last 6 months? No  LIVING ENVIRONMENT:  Lives in: House/apartment Stairs: Yes: External: 12 steps; on right going up   OCCUPATION: retired, enjoys silver sneakers aerobics and volunteering with her church  PLOF: Independent  PATIENT GOALS: decrease pain and be able to "walk more normally", return to silver sneakers exercise class  NEXT MD VISIT: May 2024  OBJECTIVE:    PALPATION: TTP Rt achilles insertion Rt ankle and foot jt mobility WFL Pain with PROM into DF on Rt  LOWER EXTREMITY ROM:  Active ROM Right eval Left eval  Hip flexion    Hip extension    Hip abduction    Hip adduction    Hip internal rotation    Hip external rotation    Knee flexion    Knee extension     Ankle dorsiflexion 1 10  Ankle plantarflexion 65 pain 65  Ankle inversion 45 50  Ankle eversion 15 pain 15   (Blank rows = not tested)  LOWER EXTREMITY MMT:  MMT Right eval Left eval  Hip flexion    Hip extension    Hip abduction    Hip adduction    Hip internal rotation    Hip external rotation    Knee flexion    Knee extension    Ankle dorsiflexion 4  4+  Ankle plantarflexion 4 pain 4+  Ankle inversion 4 4+  Ankle eversion 4- 4   (Blank rows = not tested)   FUNCTIONAL TESTS:  SLS Rt 7 sec, Lt 8 sec no c/o pain  GAIT: Distance walked: 110' Comments: antalgic gait with decreased stance time on Rt   TODAY'S TREATMENT:                                                                                                                              [facility] Adult PT Treatment:                                                DATE: 06/26/22 Therapeutic Exercise: Recumbent bike L3 x 5 min for warm up Slant board soleus stretch x 30 sec Slant board gastroc stretch x 30 sec Eccentric heel raises toes straight, in, out 2 x 10 each Tandem stance on foam 2 x 30 sec Standing on foam tapping 8'' step x 20 Rocker board A/P and laterally x 1 min each BAPS level 2 x 1 min each CW/CCW Seated heel raises 15# KB x 20  Modalities: Ionto 1mL dexamethasone 4 hour patch    [facility] Adult PT Treatment:                                                DATE: 06/19/22 Therapeutic Exercise: Recumbent bike L2 x 5 min warm up Gastroc stretch off step x 30 sec Soleus stretch x 30 sec SLS intermittent UE support 2 x 30 sec Tandem stance 2 x 30 sec on foam  Ankle 4 way green TB x 20 BAPS level 2 x 1 min each CW/CCW Heel raise with eccentric lowering 2 x 10   Modalities: Ionto 1mL dexamethasone 4 hour patch    PATIENT EDUCATION:  Education details: PT POC and goals, HEP, ionto Person educated: Patient Education method: Explanation, Demonstration, and Handouts Education comprehension: verbalized  understanding and returned demonstration  HOME EXERCISE PROGRAM: Access Code: [email or web address] URL: [email or web address] Date: 06/11/2022 Prepared by: Reggy Eye  Exercises - Long Sitting Ankle Dorsiflexion with Anchored Resistance  - 1 x daily - 7 x weekly - 3 sets - 10 reps - Long Sitting Ankle Plantar Flexion with Resistance  - 1 x daily - 7 x weekly - 3 sets - 10 reps - Long Sitting Ankle Eversion with Resistance  - 1 x daily - 7 x weekly - 3 sets - 10 reps - Long Sitting Ankle Inversion with Resistance  - 1 x daily - 7 x weekly - 3 sets - 10 reps - Standing Single Leg Stance with Counter Support  - 1 x daily - 7 x weekly - 1 sets - 3 reps - 20-30 seconds hold  ASSESSMENT:  CLINICAL IMPRESSION: Progressed standing strengthening and balance with pt with good tolerance. Continued ionto to reduce swelling. Plan to continue to progress dynamic balance next visit  OBJECTIVE IMPAIRMENTS: decreased balance, difficulty walking, decreased ROM, decreased strength, increased edema, and pain.     GOALS: Goals reviewed with patient? Yes  SHORT TERM GOALS: Target date: 06/25/2022   Pt will be independent with initial HEP Baseline: Goal status: MET    LONG TERM GOALS: Target date: 07/23/2022    Pt will be independent with advanced HEP Baseline:  Goal status: INITIAL  2.  Pt will improve FOTO to >= 61 to demo improved functional mobility Baseline:  Goal status: INITIAL  3.  Pt will improve Rt ankle strength to 4+/5 to tolerate standing and walking with decreased pain Baseline:  Goal status: INITIAL  4.  Pt will return to full silver sneakers routine with pain <= 2/10 Baseline:  Goal status: INITIAL    PLAN:  PT FREQUENCY: 2x/week  PT DURATION: 6 weeks  PLANNED INTERVENTIONS: Therapeutic exercises, Therapeutic activity, Neuromuscular re-education, Balance training, Gait training, Patient/Family education, Self Care, Joint mobilization, Aquatic Therapy, Dry  Needling, Electrical stimulation, Cryotherapy, Moist heat, Taping, Vasopneumatic device, Ultrasound, Ionotophoresis /ml Dexamethasone, Manual therapy, and Re-evaluation  PLAN FOR NEXT SESSION: SLS with pallof, leg press with calf raise?, ionto, ankle strength and mobility, pain control   Stephani Janak, PT 06/26/2022, 3:22 PM

## 2022-06-29 ENCOUNTER — Other Ambulatory Visit: Payer: Self-pay | Admitting: Medical-Surgical

## 2022-07-03 ENCOUNTER — Other Ambulatory Visit: Payer: Self-pay

## 2022-07-03 ENCOUNTER — Encounter: Payer: Self-pay | Admitting: Physical Therapy

## 2022-07-03 ENCOUNTER — Ambulatory Visit: Payer: Medicare HMO | Attending: Sports Medicine | Admitting: Physical Therapy

## 2022-07-03 DIAGNOSIS — R262 Difficulty in walking, not elsewhere classified: Secondary | ICD-10-CM | POA: Insufficient documentation

## 2022-07-03 DIAGNOSIS — M6281 Muscle weakness (generalized): Secondary | ICD-10-CM | POA: Diagnosis present

## 2022-07-03 DIAGNOSIS — M25571 Pain in right ankle and joints of right foot: Secondary | ICD-10-CM | POA: Insufficient documentation

## 2022-07-03 NOTE — Therapy (Addendum)
OUTPATIENT PHYSICAL THERAPY TREATMENT AND DISCHARGE   Patient Name: Natasha Wilson MRN: [id number] DOB:1950-07-23, 72 y.o., female Today's Date: 07/03/2022  END OF SESSION:  PT End of Session - 07/03/22 1432     Visit Number 4    Number of Visits 12    Date for PT Re-Evaluation 07/23/22    Authorization Type [employer]    Authorization Time Period 10 visits through 5/21    Authorization - Visit Number 4    Authorization - Number of Visits 10    Progress Note Due on Visit 10    PT Start Time 1400    PT Stop Time 1430    PT Time Calculation (min) 30 min    Activity Tolerance Patient limited by pain    Behavior During Therapy [facility] for tasks assessed/performed                Past Medical History:  Diagnosis Date   Allergy 6years   eczema   Depression    sometime   Generalized anxiety disorder 05/24/2016   GERD (gastroesophageal reflux disease)    History of benign essential tremor 10/24/2015   Panic attacks 03/15/2020   Panic attacks    Skin cancer    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   Thyroid activity decreased 11/10/2015   Past Surgical History:  Procedure Laterality Date   ABDOMINAL HYSTERECTOMY     ANTERIOR AND POSTERIOR REPAIR N/A 02/09/2019   Procedure: ANTERIOR (CYSTOCELE) AND POSTERIOR REPAIR (RECTOCELE);  Surgeon: Alfredo Martinez, MD;  Location: [facility];  Service: Urology;  Laterality: N/A;   bladder tack     BREAST CYST EXCISION     CHOLECYSTECTOMY     CYSTOSCOPY N/A 02/09/2019   Procedure: CYSTOSCOPY;  Surgeon: Alfredo Martinez, MD;  Location: [facility];  Service: Urology;  Laterality: N/A;   SKIN CANCER EXCISION N/A    from back   Patient Active Problem List   Diagnosis Date Noted   Achilles tendinitis, insertional, right with Haglund's deformity 05/28/2022   Atopic dermatitis 10/18/2020   Lumbar degenerative disc disease 09/19/2020   Cystocele with prolapse 02/09/2019   Postmenopausal atrophic vaginitis 08/11/2018   Prolapse of anterior vaginal wall  08/11/2018   Posterior vaginal wall prolapse 08/11/2018   Onychomycosis 11/01/2016   History of chronic eczema 11/01/2016   Generalized anxiety disorder 05/24/2016   S/P colonoscopy 12/20/2015   Thyroid activity decreased 11/10/2015   Relationship problem between parent and child 10/24/2015   Annual physical exam 10/24/2015   History of benign essential tremor 10/24/2015   History of hysterectomy for benign disease 10/24/2015    PCP: Larinda Buttery  REFERRING PROVIDER: Velva Harman  REFERRING DIAG: achilles tendonitis  THERAPY DIAG:  Pain in right ankle and joints of right foot  Muscle weakness (generalized)  Difficulty in walking, not elsewhere classified  Rationale for Evaluation and Treatment: Rehabilitation  ONSET DATE: 05/2022  SUBJECTIVE:   SUBJECTIVE STATEMENT: Pt reports the swelling has decreased a lot. She still has a lot of pain and "popping". She states she went to silver sneakers and to the store today and is having much increased pain  PERTINENT HISTORY: None reported PAIN:  Are you having pain? Yes: NPRS scale: 6/10 at rest, 6/10 with walking and standing/10 Pain location: Rt achilles Pain description: tender, sore Aggravating factors: prolonged standing or walking Relieving factors: nitro patches, elevation/rest  PRECAUTIONS: None  WEIGHT BEARING RESTRICTIONS: No  FALLS:  Has patient fallen in last 6 months? No  LIVING ENVIRONMENT:  Lives in:  House/apartment Stairs: Yes: External: 12 steps; on right going up   OCCUPATION: retired, enjoys silver sneakers aerobics and volunteering with her church  PLOF: Independent  PATIENT GOALS: decrease pain and be able to "walk more normally", return to silver sneakers exercise class  NEXT MD VISIT: May 2024  OBJECTIVE:    PALPATION: TTP Rt achilles insertion Rt ankle and foot jt mobility WFL Pain with PROM into DF on Rt  LOWER EXTREMITY ROM:  Active ROM Right eval Left eval Right 07/03/22  Hip  flexion     Hip extension     Hip abduction     Hip adduction     Hip internal rotation     Hip external rotation     Knee flexion     Knee extension     Ankle dorsiflexion 1 10 2   Ankle plantarflexion 65 pain 65 65 no pain  Ankle inversion 45 50 45  Ankle eversion 15 pain 15 15 no pain   (Blank rows = not tested)  LOWER EXTREMITY MMT:  MMT Right eval Left eval Right 07/03/22  Hip flexion     Hip extension     Hip abduction     Hip adduction     Hip internal rotation     Hip external rotation     Knee flexion     Knee extension     Ankle dorsiflexion 4  4+ 4  Ankle plantarflexion 4 pain 4+ 4 no pain  Ankle inversion 4 4+ 4  Ankle eversion 4- 4 4+   (Blank rows = not tested)   FUNCTIONAL TESTS:  SLS Rt 7 sec, Lt 8 sec no c/o pain  GAIT: Distance walked: 110' Comments: antalgic gait with decreased stance time on Rt   TODAY'S TREATMENT:                                                                                                                              [facility] Adult PT Treatment:                                                DATE: 07/03/22 Therapeutic Exercise: ROM and strength measurements - see above Ankle circles CW/CCW x 30 Manual Therapy: STM gastroc, gross friction massage achilles tendon K tape I strip with anchor to reduce swelling Lt achilles    [facility] Adult PT Treatment:                                                DATE: 06/26/22 Therapeutic Exercise: Recumbent bike L3 x 5 min for warm up Slant board soleus stretch x 30 sec Slant board gastroc stretch x 30 sec Eccentric heel  raises toes straight, in, out 2 x 10 each Tandem stance on foam 2 x 30 sec Standing on foam tapping 8'' step x 20 Rocker board A/P and laterally x 1 min each BAPS level 2 x 1 min each CW/CCW Seated heel raises 15# KB x 20  Modalities: Ionto 1mL dexamethasone 4 hour patch    [facility] Adult PT Treatment:                                                DATE:  06/19/22 Therapeutic Exercise: Recumbent bike L2 x 5 min warm up Gastroc stretch off step x 30 sec Soleus stretch x 30 sec SLS intermittent UE support 2 x 30 sec Tandem stance 2 x 30 sec on foam Ankle 4 way green TB x 20 BAPS level 2 x 1 min each CW/CCW Heel raise with eccentric lowering 2 x 10   Modalities: Ionto 1mL dexamethasone 4 hour patch    PATIENT EDUCATION:  Education details: PT POC and goals, HEP, ionto Person educated: Patient Education method: Explanation, Demonstration, and Handouts Education comprehension: verbalized understanding and returned demonstration  HOME EXERCISE PROGRAM: Access Code: [email or web address] URL: [email or web address] Date: 06/11/2022 Prepared by: Reggy Eye  Exercises - Long Sitting Ankle Dorsiflexion with Anchored Resistance  - 1 x daily - 7 x weekly - 3 sets - 10 reps - Long Sitting Ankle Plantar Flexion with Resistance  - 1 x daily - 7 x weekly - 3 sets - 10 reps - Long Sitting Ankle Eversion with Resistance  - 1 x daily - 7 x weekly - 3 sets - 10 reps - Long Sitting Ankle Inversion with Resistance  - 1 x daily - 7 x weekly - 3 sets - 10 reps - Standing Single Leg Stance with Counter Support  - 1 x daily - 7 x weekly - 1 sets - 3 reps - 20-30 seconds hold  ASSESSMENT:  CLINICAL IMPRESSION: Pt with increased pain today. Antalgic gait and unable to bear full weight on Rt LE due to ankle pain. Treatment focused on pain management and reassessment in preparation for MD appointment next week. Pt has improved functional mobility, strength and pain free ROM. She is working towards goals of returning to full daily activities with decreased pain  OBJECTIVE IMPAIRMENTS: decreased balance, difficulty walking, decreased ROM, decreased strength, increased edema, and pain.     GOALS: Goals reviewed with patient? Yes  SHORT TERM GOALS: Target date: 06/25/2022   Pt will be independent with initial HEP Baseline: Goal status:  MET    LONG TERM GOALS: Target date: 07/23/2022    Pt will be independent with advanced HEP Baseline:  Goal status: IN PROGRESS  2.  Pt will improve FOTO to >= 61 to demo improved functional mobility Baseline: 49 (07/03/22) Goal status: IN PROGRESS  3.  Pt will improve Rt ankle strength to 4+/5 to tolerate standing and walking with decreased pain Baseline:  Goal status: IN PROGRESS  4.  Pt will return to full silver sneakers routine with pain <= 2/10 Baseline:  Goal status: IN PROGRESS    PLAN:  PT FREQUENCY: 2x/week  PT DURATION: 6 weeks  PLANNED INTERVENTIONS: Therapeutic exercises, Therapeutic activity, Neuromuscular re-education, Balance training, Gait training, Patient/Family education, Self Care, Joint mobilization, Aquatic Therapy, Dry Needling, Electrical stimulation, Cryotherapy, Moist heat, Taping, Vasopneumatic device, Ultrasound, Ionotophoresis 4mg /ml Dexamethasone, Manual therapy,  and Re-evaluation  PLAN FOR NEXT SESSION: SLS with pallof, leg press with calf raise?, ionto, ankle strength and mobility, pain control PHYSICAL THERAPY DISCHARGE SUMMARY  Visits from Start of Care: 4  Current functional level related to goals / functional outcomes: Improving strength and mobility   Remaining deficits: See above   Education / Equipment: HEP   Patient agrees to discharge. Patient goals were partially met. Patient is being discharged due to not returning since the last visit.  Reggy Eye, PT,DPT01/30/259:58 AM    Haide Klinker, PT 07/03/2022, 2:33 PM

## 2022-07-04 ENCOUNTER — Other Ambulatory Visit: Payer: Self-pay | Admitting: Medical-Surgical

## 2022-07-04 DIAGNOSIS — E785 Hyperlipidemia, unspecified: Secondary | ICD-10-CM

## 2022-07-09 ENCOUNTER — Other Ambulatory Visit (INDEPENDENT_AMBULATORY_CARE_PROVIDER_SITE_OTHER): Payer: Medicare HMO

## 2022-07-09 ENCOUNTER — Ambulatory Visit (INDEPENDENT_AMBULATORY_CARE_PROVIDER_SITE_OTHER): Payer: Medicare HMO | Admitting: Sports Medicine

## 2022-07-09 DIAGNOSIS — M7661 Achilles tendinitis, right leg: Secondary | ICD-10-CM

## 2022-07-09 NOTE — Assessment & Plan Note (Signed)
Very pleasant 72 year old female with right posterior heel pain, I diagnosed her with Haglund's deformity, insertional Achilles tendinosis and retrocalcaneal bursitis, unfortunately she has now failed 6 weeks of conservative treatment in the form of 2 weeks of immobilization in a boot followed by topical nitroglycerin and formal physical therapy. We will do a retrocalcaneal bursa today under ultrasound guidance, she will do a week of boot immobilization, and return to see me in 6 weeks, she can continue her physical therapy after the first week.

## 2022-07-09 NOTE — Progress Notes (Signed)
    Procedures performed today:    Procedure: Real-time Ultrasound Guided injection of the right retrocalcaneal bursa Device: Samsung HS60  Verbal informed consent obtained.  Time-out conducted.  Noted no overlying erythema, induration, or other signs of local infection.  Skin prepped in a sterile fashion.  Local anesthesia: Topical Ethyl chloride.  With sterile technique and under real time ultrasound guidance: Noted inflamed bursa, 1 cc lidocaine, 1 cc kenalog 40 injected easily completed without difficulty  Advised to call if fevers/chills, erythema, induration, drainage, or persistent bleeding.  Images permanently stored and available for review in PACS.  Impression: Technically successful ultrasound guided injection.  Independent interpretation of notes and tests performed by another provider:   None.  Brief History, Exam, Impression, and Recommendations:    Achilles tendinitis, insertional, right with Haglund's deformity Very pleasant 72 year old female with right posterior heel pain, I diagnosed her with Haglund's deformity, insertional Achilles tendinosis and retrocalcaneal bursitis, unfortunately she has now failed 6 weeks of conservative treatment in the form of 2 weeks of immobilization in a boot followed by topical nitroglycerin and formal physical therapy. We will do a retrocalcaneal bursa today under ultrasound guidance, she will do a week of boot immobilization, and return to see me in 6 weeks, she can continue her physical therapy after the first week.    ____________________________________________ Natasha Wilson. Benjamin Stain, M.D., ABFM., CAQSM., AME. Primary Care and Sports Medicine [facility]  Adjunct Professor of Family Medicine  [employer]  Restaurant manager, fast food

## 2022-07-10 ENCOUNTER — Ambulatory Visit: Payer: Medicare HMO | Admitting: Physical Therapy

## 2022-07-30 ENCOUNTER — Ambulatory Visit (INDEPENDENT_AMBULATORY_CARE_PROVIDER_SITE_OTHER): Payer: Medicare HMO | Admitting: Medical-Surgical

## 2022-07-30 ENCOUNTER — Telehealth: Payer: Self-pay | Admitting: General Practice

## 2022-07-30 DIAGNOSIS — Z0000 Encounter for general adult medical examination without abnormal findings: Secondary | ICD-10-CM

## 2022-07-30 NOTE — Telephone Encounter (Signed)
error 

## 2022-07-30 NOTE — Patient Instructions (Signed)
MEDICARE ANNUAL WELLNESS VISIT Health Maintenance Summary and Written Plan of Care  Ms. Natasha Wilson ,  Thank you for allowing me to perform your Medicare Annual Wellness Visit and for your ongoing commitment to your health.   Health Maintenance & Immunization History Health Maintenance  Topic Date Due   COVID-19 Vaccine (5 - 2023-24 season) 08/15/2022 (Originally 11/02/2021)   Pneumonia Vaccine 35+ Years old (1 of 1 - PCV) 07/30/2023 (Originally 10/18/2015)   INFLUENZA VACCINE  10/03/2022   Medicare Annual Wellness (AWV)  07/30/2023   Colonoscopy  08/10/2023   MAMMOGRAM  12/28/2023   DTaP/Tdap/Td (3 - Td or Tdap) 11/02/2026   DEXA SCAN  Completed   Hepatitis C Screening  Completed   Zoster Vaccines- Shingrix  Completed   HPV VACCINES  Aged Out   Immunization History  Administered Date(s) Administered   [employer] Covid-19 Vaccine Bivalent Booster 52yrs & up 03/26/2021   Moderna Sars-Covid-2 Vaccination 05/03/2019, 06/01/2019, 03/08/2020   Tdap 02/08/2006, 11/01/2016   Zoster Recombinat (Shingrix) 04/17/2022, 06/17/2022    These are the patient goals that we discussed:  Goals Addressed               This Visit's Progress     Patient Stated (pt-stated)        Patient stated that she would like to loose some weight and walk more.         This is a list of Health Maintenance Items that are overdue or due now: Pneumococcal vaccine    Orders/Referrals Placed Today: No orders of the defined types were placed in this encounter.  (Contact our referral department at [phone] if you have not spoken with someone about your referral appointment within the next 5 days)    Follow-up Plan Follow-up with Christen Butter, NP as planned Discuss pneumonia vaccine with PCP.  Medicare wellness visit in one year. Patient will access AVS on my chart.      Health Maintenance, Female Adopting a healthy lifestyle and getting preventive care are important in promoting health and wellness.  Ask your health care provider about: The right schedule for you to have regular tests and exams. Things you can do on your own to prevent diseases and keep yourself healthy. What should I know about diet, weight, and exercise? Eat a healthy diet  Eat a diet that includes plenty of vegetables, fruits, low-fat dairy products, and lean protein. Do not eat a lot of foods that are high in solid fats, added sugars, or sodium. Maintain a healthy weight Body mass index (BMI) is used to identify weight problems. It estimates body fat based on height and weight. Your health care provider can help determine your BMI and help you achieve or maintain a healthy weight. Get regular exercise Get regular exercise. This is one of the most important things you can do for your health. Most adults should: Exercise for at least 150 minutes each week. The exercise should increase your heart rate and make you sweat (moderate-intensity exercise). Do strengthening exercises at least twice a week. This is in addition to the moderate-intensity exercise. Spend less time sitting. Even light physical activity can be beneficial. Watch cholesterol and blood lipids Have your blood tested for lipids and cholesterol at 72 years of age, then have this test every 5 years. Have your cholesterol levels checked more often if: Your lipid or cholesterol levels are high. You are older than 72 years of age. You are at high risk for heart disease. What should I  know about cancer screening? Depending on your health history and family history, you may need to have cancer screening at various ages. This may include screening for: Breast cancer. Cervical cancer. Colorectal cancer. Skin cancer. Lung cancer. What should I know about heart disease, diabetes, and high blood pressure? Blood pressure and heart disease High blood pressure causes heart disease and increases the risk of stroke. This is more likely to develop in people who have  high blood pressure readings or are overweight. Have your blood pressure checked: Every 3-5 years if you are 71-78 years of age. Every year if you are 52 years old or older. Diabetes Have regular diabetes screenings. This checks your fasting blood sugar level. Have the screening done: Once every three years after age 21 if you are at a normal weight and have a low risk for diabetes. More often and at a younger age if you are overweight or have a high risk for diabetes. What should I know about preventing infection? Hepatitis B If you have a higher risk for hepatitis B, you should be screened for this virus. Talk with your health care provider to find out if you are at risk for hepatitis B infection. Hepatitis C Testing is recommended for: Everyone born from [birth year] through 1965. Anyone with known risk factors for hepatitis C. Sexually transmitted infections (STIs) Get screened for STIs, including gonorrhea and chlamydia, if: You are sexually active and are younger than 72 years of age. You are older than 72 years of age and your health care provider tells you that you are at risk for this type of infection. Your sexual activity has changed since you were last screened, and you are at increased risk for chlamydia or gonorrhea. Ask your health care provider if you are at risk. Ask your health care provider about whether you are at high risk for HIV. Your health care provider may recommend a prescription medicine to help prevent HIV infection. If you choose to take medicine to prevent HIV, you should first get tested for HIV. You should then be tested every 3 months for as long as you are taking the medicine. Pregnancy If you are about to stop having your period (premenopausal) and you may become pregnant, seek counseling before you get pregnant. Take 400 to 800 micrograms (mcg) of folic acid every day if you become pregnant. Ask for birth control (contraception) if you want to prevent  pregnancy. Osteoporosis and menopause Osteoporosis is a disease in which the bones lose minerals and strength with aging. This can result in bone fractures. If you are 46 years old or older, or if you are at risk for osteoporosis and fractures, ask your health care provider if you should: Be screened for bone loss. Take a calcium or vitamin D supplement to lower your risk of fractures. Be given hormone replacement therapy (HRT) to treat symptoms of menopause. Follow these instructions at home: Alcohol use Do not drink alcohol if: Your health care provider tells you not to drink. You are pregnant, may be pregnant, or are planning to become pregnant. If you drink alcohol: Limit how much you have to: 0-1 drink a day. Know how much alcohol is in your drink. In the [place] one drink equals one 12 oz bottle of beer (355 mL), one 5 oz glass of wine (148 mL), or one 1 oz glass of hard liquor (44 mL). Lifestyle Do not use any products that contain nicotine or tobacco. These products include cigarettes, chewing tobacco, and  vaping devices, such as e-cigarettes. If you need help quitting, ask your health care provider. Do not use street drugs. Do not share needles. Ask your health care provider for help if you need support or information about quitting drugs. General instructions Schedule regular health, dental, and eye exams. Stay current with your vaccines. Tell your health care provider if: You often feel depressed. You have ever been abused or do not feel safe at home. Summary Adopting a healthy lifestyle and getting preventive care are important in promoting health and wellness. Follow your health care provider's instructions about healthy diet, exercising, and getting tested or screened for diseases. Follow your health care provider's instructions on monitoring your cholesterol and blood pressure. This information is not intended to replace advice given to you by your health care provider.  Make sure you discuss any questions you have with your health care provider. Document Revised: 07/10/2020 Document Reviewed: 07/10/2020 Elsevier Patient Education  2024 [employer].

## 2022-07-30 NOTE — Progress Notes (Signed)
MEDICARE ANNUAL WELLNESS VISIT  07/30/2022  Telephone Visit Disclaimer This Medicare AWV was conducted by telephone due to national recommendations for restrictions regarding the COVID-19 Pandemic (e.g. social distancing).  I verified, using two identifiers, that I am speaking with Natasha Wilson or their authorized healthcare agent. I discussed the limitations, risks, security, and privacy concerns of performing an evaluation and management service by telephone and the potential availability of an in-person appointment in the future. The patient expressed understanding and agreed to proceed.  Location of Patient: Home Location of Provider (nurse):  In the office.  Subjective:    Natasha Wilson is a 72 y.o. female patient of Christen Butter, NP who had a Medicare Annual Wellness Visit today via telephone. Natasha Wilson is Retired and lives alone. she has 2 children. she reports that she is socially active and does interact with friends/family regularly. she is moderately physically active and enjoys watching television, reading, playing games on the computer, and going to aerobics.  Patient Care Team: Christen Butter, NP as PCP - General (Nurse Practitioner)     07/30/2022    2:31 PM 07/23/2021    2:05 PM 03/17/2020    9:00 AM 02/22/2019   11:09 AM 02/11/2019    3:28 AM 02/09/2019    3:47 PM 02/08/2019    9:34 AM  Advanced Directives  Does Patient Have a Medical Advance Directive? Yes Yes Yes Yes Yes Yes Yes  Type of Advance Directive Living will Living will Living will;Healthcare Power of [employer] Power of [place];Living will Healthcare Power of [place];Living will Healthcare Power of [employer] Power of Attorney  Does patient want to make changes to medical advance directive? No - Patient declined No - Patient declined No - Patient declined No - Patient declined No - Patient declined No - Patient declined   Copy of Healthcare Power of Attorney in Chart?   No - copy requested Yes -  validated most recent copy scanned in chart (See row information) No - copy requested Yes - validated most recent copy scanned in chart (See row information) Yes - validated most recent copy scanned in chart (See row information)  Would patient like information on creating a medical advance directive?   No - Patient declined No - Patient declined No - Patient declined      Hospital Utilization Over the Past 12 Months: # of hospitalizations or ER visits: 0 # of surgeries: 1  Review of Systems    Patient reports that her overall health is unchanged compared to last year.  History obtained from chart review and the patient  Patient Reported Readings (BP, Pulse, CBG, Weight, etc) none  Pain Assessment Pain : No/denies pain     Current Medications & Allergies (verified) Allergies as of 07/30/2022       Reactions   Latex Rash   Cefuroxime Nausea And Vomiting   Red splotches on face.   Valsartan Rash        Medication List        Accurate as of Jul 30, 2022  2:43 PM. If you have any questions, ask your nurse or doctor.          acetaminophen 500 MG tablet Commonly known as: TYLENOL Take 1,000 mg by mouth every 6 (six) hours as needed for moderate pain.   amLODipine 5 MG tablet Commonly known as: NORVASC Take 1 tablet by mouth once daily   cetirizine 10 MG tablet Commonly known as: ZYRTEC Take 10 mg  by mouth daily.   levothyroxine 100 MCG tablet Commonly known as: SYNTHROID TAKE 1 TABLET BY MOUTH ONCE DAILY BEFORE BREAKFAST NEEDS  A  THYROID  CHECK  FOR  MORE  REFILLS   nitroGLYCERIN 0.2 mg/hr patch Commonly known as: NITRODUR - Dosed in mg/24 hr Cut and apply 1/4 patch to most painful area q24h.   primidone 50 MG tablet Commonly known as: MYSOLINE Take 1 tablet (50 mg total) by mouth at bedtime.   rosuvastatin 20 MG tablet Commonly known as: CRESTOR Take 1 tablet by mouth once daily   sertraline 50 MG tablet Commonly known as: ZOLOFT Take 1 tablet (50  mg total) by mouth daily.   valACYclovir 1000 MG tablet Commonly known as: VALTREX Take 2 tablets (2,000 mg total) by mouth 2 (two) times daily as needed.        History (reviewed): Past Medical History:  Diagnosis Date   Allergy 6years   eczema   Depression    sometime   Generalized anxiety disorder 05/24/2016   GERD (gastroesophageal reflux disease)    History of benign essential tremor 10/24/2015   Hypertension    Panic attacks 03/15/2020   Panic attacks    Skin cancer    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   Thyroid activity decreased 11/10/2015   Past Surgical History:  Procedure Laterality Date   ABDOMINAL HYSTERECTOMY     ANTERIOR AND POSTERIOR REPAIR N/A 02/09/2019   Procedure: ANTERIOR (CYSTOCELE) AND POSTERIOR REPAIR (RECTOCELE);  Surgeon: Alfredo Martinez, MD;  Location: [facility];  Service: Urology;  Laterality: N/A;   bladder tack     BREAST CYST EXCISION     CHOLECYSTECTOMY     CYSTOSCOPY N/A 02/09/2019   Procedure: CYSTOSCOPY;  Surgeon: Alfredo Martinez, MD;  Location: [facility];  Service: Urology;  Laterality: N/A;   SKIN CANCER EXCISION N/A    from back   Family History  Problem Relation Age of Onset   Alcohol abuse Father    Heart attack Father    Cancer Sister        LUNG   Depression Paternal Grandfather    Dementia Mother    Stroke Mother    Alcohol abuse Son    Cancer Maternal Aunt    Social History   Socioeconomic History   Marital status: Single    Spouse name: Not on file   Number of children: 2   Years of education: 14   Highest education level: Associate degree: occupational, Scientist, product/process development, or vocational program  Occupational History   Occupation: retired    Comment: [employer]  Tobacco Use   Smoking status: Former    Packs/day: 1.00    Years: 15.00    Additional pack years: 0.00    Total pack years: 15.00    Types: Cigarettes    Quit date: 07/03/1993    Years since quitting: 29.0   Smokeless tobacco: Never  Vaping Use   Vaping Use: Never used   Substance and Sexual Activity   Alcohol use: Yes    Alcohol/week: 7.0 standard drinks of alcohol    Types: 7 Glasses of wine per week    Comment: 4 ounces a night   Drug use: No   Sexual activity: Not Currently    Birth control/protection: None  Other Topics Concern   Not on file  Social History Narrative   Lives alone. Her grandson is staying for a few weeks. Runs errands. Watch TV. Reads a lot and plays games on the computer. Enjoys going  to aerobics class three times a week. She also enjoys reading.   Social Determinants of Health   Financial Resource Strain: Low Risk  (05/27/2022)   Overall Financial Resource Strain (CARDIA)    Difficulty of Paying Living Expenses: Not hard at all  Food Insecurity: No Food Insecurity (05/27/2022)   Hunger Vital Sign    Worried About Running Out of Food in the Last Year: Never true    Ran Out of Food in the Last Year: Never true  Transportation Needs: No Transportation Needs (05/27/2022)   PRAPARE - Administrator, Civil Service (Medical): No    Lack of Transportation (Non-Medical): No  Physical Activity: Sufficiently Active (05/27/2022)   Exercise Vital Sign    Days of Exercise per Week: 3 days    Minutes of Exercise per Session: 60 min  Stress: No Stress Concern Present (05/27/2022)   [employer] of Occupational Health - Occupational Stress Questionnaire    Feeling of Stress : Not at all  Social Connections: Moderately Integrated (05/27/2022)   Social Connection and Isolation Panel [NHANES]    Frequency of Communication with Friends and Family: More than three times a week    Frequency of Social Gatherings with Friends and Family: Patient declined    Attends Religious Services: More than 4 times per year    Active Member of [employer] or Organizations: Yes    Attends Banker Meetings: More than 4 times per year    Marital Status: Divorced    Activities of Daily Living    07/26/2022   12:47 PM  In your present  state of health, do you have any difficulty performing the following activities:  Hearing? 0  Vision? 0  Difficulty concentrating or making decisions? 0  Walking or climbing stairs? 0  Dressing or bathing? 0  Doing errands, shopping? 0  Preparing Food and eating ? N  Using the Toilet? N  In the past six months, have you accidently leaked urine? Y  Do you have problems with loss of bowel control? N  Managing your Medications? N  Managing your Finances? N  Housekeeping or managing your Housekeeping? N    Patient Education/ Literacy How often do you need to have someone help you when you read instructions, pamphlets, or other written materials from your doctor or pharmacy?: 1 - Never What is the last grade level you completed in school?: associates degree  Exercise Current Exercise Habits: Structured exercise class, Type of exercise: Other - see comments (aerobics), Time (Minutes): 60, Frequency (Times/Week): 3, Weekly Exercise (Minutes/Week): 180, Intensity: Moderate, Exercise limited by: None identified  Diet Patient reports consuming  2-3  meals a day and 1 snack(s) a day Patient reports that her primary diet is: Regular Patient reports that she does have regular access to food.   Depression Screen    07/30/2022    2:32 PM 06/14/2022    1:58 PM 04/16/2022    2:50 PM 07/23/2021    2:05 PM 06/25/2021   10:43 AM 04/03/2021    3:31 PM 03/01/2021    1:06 PM  PHQ 2/9 Scores  PHQ - 2 Score 1 2 2  0 1 2 1   PHQ- 9 Score 6 9 11   16 5      Fall Risk    07/30/2022    2:29 PM 07/26/2022   12:47 PM 06/14/2022    1:58 PM 04/16/2022    1:58 PM 07/23/2021    2:05 PM  Fall Risk  Falls in the past year? 1  0 1 1  Number falls in past yr: 0 0 0 0 0  Injury with Fall? 1 1 0 1 0  Risk for fall due to : History of fall(s)  No Fall Risks History of fall(s) History of fall(s);No Fall Risks  Follow up Falls evaluation completed  Falls evaluation completed Falls evaluation completed Falls  evaluation completed     Objective:  Natasha Wilson seemed alert and oriented and she participated appropriately during our telephone visit.  Blood Pressure Weight BMI  BP Readings from Last 3 Encounters:  06/14/22 133/71  04/16/22 112/67  11/20/21 (!) 157/73   Wt Readings from Last 3 Encounters:  06/14/22 161 lb 9.6 oz (73.3 kg)  04/16/22 159 lb 6.4 oz (72.3 kg)  10/23/21 159 lb 8 oz (72.3 kg)   BMI Readings from Last 1 Encounters:  06/14/22 29.56 kg/m    *Unable to obtain current vital signs, weight, and BMI due to telephone visit type  Hearing/Vision  Natasha Wilson did not seem to have difficulty with hearing/understanding during the telephone conversation Reports that she has had a formal eye exam by an eye care professional within the past year Reports that she has not had a formal hearing evaluation within the past year *Unable to fully assess hearing and vision during telephone visit type  Cognitive Function:    07/30/2022    2:38 PM 07/23/2021    2:10 PM 03/17/2020    9:04 AM 02/22/2019   11:15 AM 02/16/2018   11:09 AM  6CIT Screen  What Year? 0 points 0 points 0 points 0 points 0 points  What month? 0 points 0 points 0 points 0 points 0 points  What time? 0 points 0 points 0 points 0 points 0 points  Count back from 20 0 points 0 points 0 points 0 points 0 points  Months in reverse 0 points  0 points 0 points 0 points  Repeat phrase 0 points  0 points 2 points 0 points  Total Score 0 points  0 points 2 points 0 points   (Normal:0-7, Significant for Dysfunction: >8)  Normal Cognitive Function Screening: Yes   Immunization & Health Maintenance Record Immunization History  Administered Date(s) Administered   Moderna Covid-19 Vaccine Bivalent Booster 32yrs & up 03/26/2021   Moderna Sars-Covid-2 Vaccination 05/03/2019, 06/01/2019, 03/08/2020   Tdap 02/08/2006, 11/01/2016   Zoster Recombinat (Shingrix) 04/17/2022, 06/17/2022    Health Maintenance  Topic Date Due    COVID-19 Vaccine (5 - 2023-24 season) 08/15/2022 (Originally 11/02/2021)   Pneumonia Vaccine 75+ Years old (1 of 1 - PCV) 07/30/2023 (Originally 10/18/2015)   INFLUENZA VACCINE  10/03/2022   Medicare Annual Wellness (AWV)  07/30/2023   Colonoscopy  08/10/2023   MAMMOGRAM  12/28/2023   DTaP/Tdap/Td (3 - Td or Tdap) 11/02/2026   DEXA SCAN  Completed   Hepatitis C Screening  Completed   Zoster Vaccines- Shingrix  Completed   HPV VACCINES  Aged Out       Assessment  This is a routine wellness examination for Natasha Wilson.  Health Maintenance: Due or Overdue There are no preventive care reminders to display for this patient.   Natasha Wilson does not need a referral for [employer] Assistance: Care Management:   no Social Work:    no Prescription Assistance:  no Nutrition/Diabetes Education:  no   Plan:  Personalized Goals  Goals Addressed  This Visit's Progress     Patient Stated (pt-stated)        Patient stated that she would like to loose some weight and walk more.       Personalized Health Maintenance & Screening Recommendations  Pneumococcal vaccine  Lung Cancer Screening Recommended: no (Low Dose CT Chest recommended if Age 4-80 years, 30 pack-year currently smoking OR have quit w/in past 15 years) Hepatitis C Screening recommended: no HIV Screening recommended: no  Advanced Directives: Written information was not prepared per patient's request.  Referrals & Orders No orders of the defined types were placed in this encounter.   Follow-up Plan Follow-up with Christen Butter, NP as planned Discuss pneumonia vaccine with PCP.  Medicare wellness visit in one year. Patient will access AVS on my chart.   I have personally reviewed and noted the following in the patient's chart:   Medical and social history Use of alcohol, tobacco or illicit drugs  Current medications and supplements Functional ability and status Nutritional status Physical  activity Advanced directives List of other physicians Hospitalizations, surgeries, and ER visits in previous 12 months Vitals Screenings to include cognitive, depression, and falls Referrals and appointments  In addition, I have reviewed and discussed with Natasha Wilson certain preventive protocols, quality metrics, and best practice recommendations. A written personalized care plan for preventive services as well as general preventive health recommendations is available and can be mailed to the patient at her request.      Modesto Charon, RN BSN  07/30/2022

## 2022-08-10 ENCOUNTER — Other Ambulatory Visit: Payer: Self-pay | Admitting: Medical-Surgical

## 2022-08-13 ENCOUNTER — Other Ambulatory Visit: Payer: Self-pay | Admitting: Medical-Surgical

## 2022-08-20 ENCOUNTER — Ambulatory Visit (INDEPENDENT_AMBULATORY_CARE_PROVIDER_SITE_OTHER): Payer: Medicare HMO | Admitting: Sports Medicine

## 2022-08-20 DIAGNOSIS — M7661 Achilles tendinitis, right leg: Secondary | ICD-10-CM | POA: Diagnosis not present

## 2022-08-20 NOTE — Assessment & Plan Note (Signed)
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 If this recurs I would like her to return to the boot for 2 to 3 weeks before considering making appointment with me.

## 2022-08-20 NOTE — Progress Notes (Signed)
    Procedures performed today:    None.  Independent interpretation of notes and tests performed by another provider:   None.  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 If this recurs I would like her to return to the boot for 2 to 3 weeks before considering making appointment with me.    ____________________________________________ Ihor Austin. Benjamin Stain, M.D., ABFM., CAQSM., AME. Primary Care and Sports Medicine [facility]  Adjunct Professor of Family Medicine  [place] [employer] [facility]  Restaurant manager, fast food

## 2022-08-23 ENCOUNTER — Ambulatory Visit (INDEPENDENT_AMBULATORY_CARE_PROVIDER_SITE_OTHER): Payer: Medicare HMO | Admitting: Physician Assistant

## 2022-08-23 ENCOUNTER — Encounter: Payer: Self-pay | Admitting: Physician Assistant

## 2022-08-23 VITALS — BP 114/53 | HR 68 | Wt 162.0 lb

## 2022-08-23 DIAGNOSIS — N3289 Other specified disorders of bladder: Secondary | ICD-10-CM | POA: Diagnosis not present

## 2022-08-23 DIAGNOSIS — N3000 Acute cystitis without hematuria: Secondary | ICD-10-CM | POA: Diagnosis not present

## 2022-08-23 DIAGNOSIS — R198 Other specified symptoms and signs involving the digestive system and abdomen: Secondary | ICD-10-CM | POA: Diagnosis not present

## 2022-08-23 LAB — POCT URINALYSIS DIP (CLINITEK)
Bilirubin, UA: NEGATIVE
Blood, UA: NEGATIVE
Glucose, UA: 100 mg/dL — AB
Ketones, POC UA: NEGATIVE mg/dL
Leukocytes, UA: NEGATIVE
Nitrite, UA: POSITIVE — AB
POC PROTEIN,UA: NEGATIVE
Spec Grav, UA: 1.015 (ref 1.010–1.025)
Urobilinogen, UA: 2 E.U./dL — AB
pH, UA: 6.5 (ref 5.0–8.0)

## 2022-08-23 MED ORDER — PHENAZOPYRIDINE HCL 200 MG PO TABS
200.0000 mg | ORAL_TABLET | Freq: Three times a day (TID) | ORAL | 0 refills | Status: AC
Start: 2022-08-23 — End: 2022-08-25

## 2022-08-23 MED ORDER — SULFAMETHOXAZOLE-TRIMETHOPRIM 800-160 MG PO TABS
1.0000 | ORAL_TABLET | Freq: Two times a day (BID) | ORAL | 0 refills | Status: DC
Start: 2022-08-23 — End: 2022-10-15

## 2022-08-23 NOTE — Progress Notes (Signed)
Sxs began on Wednesday. She reports that it felt like someone was "squeezing" on her bladder. She had some diarrhea on Wednesday also. Denies any f/s/c/n/v. No blood in her urine.   She took something for pain on Thursday

## 2022-08-23 NOTE — Patient Instructions (Signed)
Urinary Tract Infection, Adult A urinary tract infection (UTI) is an infection of any part of the urinary tract. The urinary tract includes: The kidneys. The ureters. The bladder. The urethra. These organs make, store, and get rid of pee (urine) in the body. What are the causes? This infection is caused by germs (bacteria) in your genital area. These germs grow and cause swelling (inflammation) of your urinary tract. What increases the risk? The following factors may make you more likely to develop this condition: Using a small, thin tube (catheter) to drain pee. Not being able to control when you pee or poop (incontinence). Being female. If you are female, these things can increase the risk: Using these methods to prevent pregnancy: A medicine that kills sperm (spermicide). A device that blocks sperm (diaphragm). Having low levels of a female hormone (estrogen). Being pregnant. You are more likely to develop this condition if: You have genes that add to your risk. You are sexually active. You take antibiotic medicines. You have trouble peeing because of: A prostate that is bigger than normal, if you are female. A blockage in the part of your body that drains pee from the bladder. A kidney stone. A nerve condition that affects your bladder. Not getting enough to drink. Not peeing often enough. You have other conditions, such as: Diabetes. A weak disease-fighting system (immune system). Sickle cell disease. Gout. Injury of the spine. What are the signs or symptoms? Symptoms of this condition include: Needing to pee right away. Peeing small amounts often. Pain or burning when peeing. Blood in the pee. Pee that smells bad or not like normal. Trouble peeing. Pee that is cloudy. Fluid coming from the vagina, if you are female. Pain in the belly or lower back. Other symptoms include: Vomiting. Not feeling hungry. Feeling mixed up (confused). This may be the first symptom in  older adults. Being tired and grouchy (irritable). A fever. Watery poop (diarrhea). How is this treated? Taking antibiotic medicine. Taking other medicines. Drinking enough water. In some cases, you may need to see a specialist. Follow these instructions at home:  Medicines Take over-the-counter and prescription medicines only as told by your doctor. If you were prescribed an antibiotic medicine, take it as told by your doctor. Do not stop taking it even if you start to feel better. General instructions Make sure you: Pee until your bladder is empty. Do not hold pee for a long time. Empty your bladder after sex. Wipe from front to back after peeing or pooping if you are a female. Use each tissue one time when you wipe. Drink enough fluid to keep your pee pale yellow. Keep all follow-up visits. Contact a doctor if: You do not get better after 1-2 days. Your symptoms go away and then come back. Get help right away if: You have very bad back pain. You have very bad pain in your lower belly. You have a fever. You have chills. You feeling like you will vomit or you vomit. Summary A urinary tract infection (UTI) is an infection of any part of the urinary tract. This condition is caused by germs in your genital area. There are many risk factors for a UTI. Treatment includes antibiotic medicines. Drink enough fluid to keep your pee pale yellow. This information is not intended to replace advice given to you by your health care provider. Make sure you discuss any questions you have with your health care provider. Document Revised: 09/26/2019 Document Reviewed: 10/01/2019 Elsevier Patient Education    2024 Elsevier Inc.  

## 2022-08-23 NOTE — Progress Notes (Signed)
Acute Office Visit  Subjective:     Patient ID: Natasha Wilson, female    DOB: 09/22/1950, 72 y.o.   MRN: [id number]  Chief Complaint  Patient presents with   Urinary Tract Infection    Patient presents with lower abdominal cramping and pain that began Wednesday night (2 days ago). At that time it was responsive to Azo and a heating pad. When she awoke the next morning, she took azo again empirically. At that point, the pain was no longer present but she said her lower abdominal region and pelvis feel "like jello" as though her insides are shaking. States she "just doesn't feel right." Today that feeling persists and is associated with pressure and difficulty voiding. She again took azo empirically. She has frequent UTIs that start with severe pain and cramping similar to what she experienced Wednesday night, but this shaking feeling is new. She denies fever, urinary retention, flank pain.     Review of Systems  Constitutional:  Negative for chills and fever.  Gastrointestinal:  Positive for abdominal pain. Negative for constipation, diarrhea, heartburn, nausea and vomiting.  Genitourinary:  Negative for dysuria, flank pain, frequency, hematuria and urgency.  Musculoskeletal:  Negative for back pain and myalgias.  Skin:  Negative for itching.       Objective:    BP (!) 114/53   Pulse 68   Wt 162 lb (73.5 kg)   SpO2 94%   BMI 29.63 kg/m     Physical Exam Constitutional:      Appearance: Normal appearance. She is obese.  HENT:     Head: Normocephalic.  Cardiovascular:     Rate and Rhythm: Normal rate and regular rhythm.  Pulmonary:     Effort: Pulmonary effort is normal.     Breath sounds: Normal breath sounds.  Abdominal:     General: There is no distension.     Palpations: Abdomen is soft. There is no mass.     Tenderness: There is no abdominal tenderness. There is no right CVA tenderness, left CVA tenderness or rebound.     Hernia: No hernia is present.      Comments: Deep palpation of suprapubic and left inguinal areas elicit guarding. Patient states it does not hurt but feels like pressure. Bowel sounds are hyperactive.  Musculoskeletal:     Right lower leg: No edema.     Left lower leg: No edema.  Skin:    General: Skin is warm.     Coloration: Skin is not jaundiced.     Findings: No bruising.  Neurological:     General: No focal deficit present.     Mental Status: She is alert and oriented to person, place, and time.  Psychiatric:        Mood and Affect: Mood normal.     Results for orders placed or performed in visit on 08/23/22  POCT URINALYSIS DIP (CLINITEK)  Result Value Ref Range   Color, UA orange (A) yellow   Clarity, UA clear clear   Glucose, UA =100 (A) negative mg/dL   Bilirubin, UA negative negative   Ketones, POC UA negative negative mg/dL   Spec Grav, UA 4.098 1.191 - 1.025   Blood, UA negative negative   pH, UA 6.5 5.0 - 8.0   POC PROTEIN,UA negative negative, trace   Urobilinogen, UA 2.0 (A) 0.2 or 1.0 E.U./dL   Nitrite, UA Positive (A) Negative   Leukocytes, UA Negative Negative        Assessment &  Plan:  ..Camdyn was seen today for urinary tract infection.  Diagnoses and all orders for this visit:  Acute cystitis without hematuria -     sulfamethoxazole-trimethoprim (BACTRIM DS) 800-160 MG tablet; Take 1 tablet by mouth 2 (two) times daily. -     phenazopyridine (PYRIDIUM) 200 MG tablet; Take 1 tablet (200 mg total) by mouth 3 (three) times daily for 2 days.  Bladder spasms -     POCT URINALYSIS DIP (CLINITEK) -     sulfamethoxazole-trimethoprim (BACTRIM DS) 800-160 MG tablet; Take 1 tablet by mouth 2 (two) times daily. -     phenazopyridine (PYRIDIUM) 200 MG tablet; Take 1 tablet (200 mg total) by mouth 3 (three) times daily for 2 days.  Abdominal fullness -     Urine Culture -     sulfamethoxazole-trimethoprim (BACTRIM DS) 800-160 MG tablet; Take 1 tablet by mouth 2 (two) times daily. -      phenazopyridine (PYRIDIUM) 200 MG tablet; Take 1 tablet (200 mg total) by mouth 3 (three) times daily for 2 days.  Left lower quadrant guarding -     sulfamethoxazole-trimethoprim (BACTRIM DS) 800-160 MG tablet; Take 1 tablet by mouth 2 (two) times daily. -     phenazopyridine (PYRIDIUM) 200 MG tablet; Take 1 tablet (200 mg total) by mouth 3 (three) times daily for 2 days.   UA positive for nitrites Will send out for culture Patient to take pyridium for bladder inflammation and pain Patient to take Bactrim x3 days for uncomplicated UTI If sxs do not improve or worsen, she needs to return for abdominopelvic imaging to rule out insidious mechanisms  Return if symptoms worsen or fail to improve.  Tandy Gaw, PA-C

## 2022-08-24 LAB — URINE CULTURE
MICRO NUMBER:: 15112612
Result:: NO GROWTH
SPECIMEN QUALITY:: ADEQUATE

## 2022-08-26 NOTE — Progress Notes (Signed)
No bacteria growth on urine culture. Are you still having symptoms?

## 2022-09-07 ENCOUNTER — Other Ambulatory Visit: Payer: Self-pay | Admitting: Medical-Surgical

## 2022-09-08 ENCOUNTER — Encounter: Payer: Self-pay | Admitting: Emergency Medicine

## 2022-09-08 ENCOUNTER — Ambulatory Visit
Admission: EM | Admit: 2022-09-08 | Discharge: 2022-09-08 | Disposition: A | Discharge status: Home / Self Care | Payer: Medicare HMO | Attending: Family Medicine | Admitting: Family Medicine

## 2022-09-08 DIAGNOSIS — S61412A Laceration without foreign body of left hand, initial encounter: Secondary | ICD-10-CM | POA: Diagnosis not present

## 2022-09-08 NOTE — ED Notes (Signed)
Dry dressing with coban applied to left hand.

## 2022-09-08 NOTE — ED Triage Notes (Signed)
Patient states that she was using a knife to get wax out of a wax burner and cut her left thumb.  The area does have some bleeding.  Patient unsure of her last Tdap.

## 2022-09-08 NOTE — Discharge Instructions (Addendum)
Protect area for 5 - 7 days  May soak off glue  when healed

## 2022-09-08 NOTE — ED Provider Notes (Signed)
[facility]    CSN: [id number] Arrival date & time: 09/08/22  1104      History   Chief Complaint Chief Complaint  Patient presents with   Laceration    HPI Natasha Wilson is a 72 y.o. female.   HPI  Patient is here for a laceration to her left hand.  She accidentally cut herself.  Tetanus is up-to-date.  Past Medical History:  Diagnosis Date   Allergy 6years   eczema   Depression    sometime   Generalized anxiety disorder 05/24/2016   GERD (gastroesophageal reflux disease)    History of benign essential tremor 10/24/2015   Hypertension    Panic attacks 03/15/2020   Panic attacks    Skin cancer    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   Thyroid activity decreased 11/10/2015    Patient Active Problem List   Diagnosis Date Noted   Abdominal fullness 08/23/2022   Bladder spasms 08/23/2022   Achilles tendinitis, insertional, right with Haglund's deformity 05/28/2022   Atopic dermatitis 10/18/2020   Lumbar degenerative disc disease 09/19/2020   Cystocele with prolapse 02/09/2019   Postmenopausal atrophic vaginitis 08/11/2018   Prolapse of anterior vaginal wall 08/11/2018   Posterior vaginal wall prolapse 08/11/2018   Onychomycosis 11/01/2016   History of chronic eczema 11/01/2016   Generalized anxiety disorder 05/24/2016   S/P colonoscopy 12/20/2015   Thyroid activity decreased 11/10/2015   Relationship problem between parent and child 10/24/2015   Annual physical exam 10/24/2015   History of benign essential tremor 10/24/2015   History of hysterectomy for benign disease 10/24/2015    Past Surgical History:  Procedure Laterality Date   ABDOMINAL HYSTERECTOMY     ANTERIOR AND POSTERIOR REPAIR N/A 02/09/2019   Procedure: ANTERIOR (CYSTOCELE) AND POSTERIOR REPAIR (RECTOCELE);  Surgeon: Alfredo Martinez, MD;  Location: [facility];  Service: Urology;  Laterality: N/A;   bladder tack     BREAST CYST EXCISION     CHOLECYSTECTOMY     CYSTOSCOPY N/A 02/09/2019   Procedure:  CYSTOSCOPY;  Surgeon: Alfredo Martinez, MD;  Location: [facility];  Service: Urology;  Laterality: N/A;   SKIN CANCER EXCISION N/A    from back    OB History     Gravida  2   Para  2   Term  2   Preterm      AB      Living  2      SAB      IAB      Ectopic      Multiple      Live Births               Home Medications    Prior to Admission medications   Medication Sig Start Date End Date Taking? Authorizing Provider  acetaminophen (TYLENOL) 500 MG tablet Take 1,000 mg by mouth every 6 (six) hours as needed for moderate pain.   Yes [provider]  amLODipine (NORVASC) 5 MG tablet Take 1 tablet by mouth once daily 08/12/22  Yes Jessup, Joy, NP  cetirizine (ZYRTEC) 10 MG tablet Take 10 mg by mouth daily.   Yes [provider]  levothyroxine (SYNTHROID) 100 MCG tablet TAKE 1 TABLET BY MOUTH ONCE DAILY BEFORE BREAKFAST . APPOINTMENT REQUIRED FOR FUTURE REFILLS 08/12/22  Yes Christen Butter, NP  primidone (MYSOLINE) 50 MG tablet Take 1 tablet (50 mg total) by mouth at bedtime. 06/05/22  Yes Christen Butter, NP  rosuvastatin (CRESTOR) 20 MG tablet Take 1 tablet by  mouth once daily 07/04/22  Yes Christen Butter, NP  sertraline (ZOLOFT) 50 MG tablet Take 1 tablet (50 mg total) by mouth daily. 06/14/22  Yes Christen Butter, NP  sulfamethoxazole-trimethoprim (BACTRIM DS) 800-160 MG tablet Take 1 tablet by mouth 2 (two) times daily. 08/23/22  Yes Breeback, Jade L, PA-C  valACYclovir (VALTREX) 1000 MG tablet Take 2 tablets (2,000 mg total) by mouth 2 (two) times daily as needed. 06/14/22  Yes Christen Butter, NP    Family History Family History  Problem Relation Age of Onset   Dementia Mother    Stroke Mother    Alcohol abuse Father    Heart attack Father    Cancer Sister        LUNG   Depression Paternal Grandfather    Alcohol abuse Son    Cancer Maternal Aunt     Social History Social History   Tobacco Use   Smoking status: Former    Packs/day: 1.00    Years: 15.00     Additional pack years: 0.00    Total pack years: 15.00    Types: Cigarettes    Quit date: 07/03/1993    Years since quitting: 29.2   Smokeless tobacco: Never  Vaping Use   Vaping Use: Never used  Substance Use Topics   Alcohol use: Yes    Alcohol/week: 7.0 standard drinks of alcohol    Types: 7 Glasses of wine per week    Comment: 4 ounces a night   Drug use: No     Allergies   Latex, Cefuroxime, and Valsartan   Review of Systems Review of Systems  See HPI Physical Exam Triage Vital Signs ED Triage Vitals  Enc Vitals Group     BP 09/08/22 1130 137/68     Pulse Rate 09/08/22 1130 68     Resp 09/08/22 1130 16     Temp 09/08/22 1130 98.1 F (36.7 C)     Temp Source 09/08/22 1130 Oral     SpO2 09/08/22 1130 96 %     Weight 09/08/22 1132 158 lb (71.7 kg)     Height 09/08/22 1132 5\' 2"  (1.575 m)     Head Circumference --      Peak Flow --      Pain Score 09/08/22 1132 8     Pain Loc --      Pain Edu? --      Excl. in GC? --    No data found.  Updated Vital Signs BP 137/68 (BP Location: Left Arm)   Pulse 68   Temp 98.1 F (36.7 C) (Oral)   Resp 16   Ht 5\' 2"  (1.575 m)   Wt 71.7 kg   SpO2 96%   BMI 28.90 kg/m   Physical Exam Constitutional:      General: She is not in acute distress.    Appearance: She is well-developed.  HENT:     Head: Normocephalic and atraumatic.  Eyes:     Conjunctiva/sclera: Conjunctivae normal.     Pupils: Pupils are equal, round, and reactive to light.  Cardiovascular:     Rate and Rhythm: Normal rate.  Pulmonary:     Effort: Pulmonary effort is normal. No respiratory distress.  Abdominal:     General: There is no distension.     Palpations: Abdomen is soft.  Musculoskeletal:        General: Normal range of motion.     Cervical back: Normal range of motion.  Skin:    General:  Skin is warm and dry.     Comments: In the webspace between the thumb and index finger there is a straight laceration that measures 2 cm.  Subcu  tissue seen underneath  Neurological:     Mental Status: She is alert.      UC Treatments / Results  Labs (all labs ordered are listed, but only abnormal results are displayed) Labs Reviewed - No data to display  EKG   Radiology No results found.  Procedures Procedures (including critical care time)  Medications Ordered in UC Medications - No data to display  Initial Impression / Assessment and Plan / UC Course  I have reviewed the triage vital signs and the nursing notes.  Pertinent labs & imaging results that were available during my care of the patient were reviewed by me and considered in my medical decision making (see chart for details).     Area was cleansed with Hibiclens.  Dried.  Then put Dermabond on the wound with good approximation.  Wound care discussed Final Clinical Impressions(s) / UC Diagnoses   Final diagnoses:  Laceration of left hand without foreign body, initial encounter     Discharge Instructions      Protect area for 5 - 7 days  May soak off glue  when healed   ED Prescriptions   None    PDMP not reviewed this encounter.   Eustace Moore, MD 09/08/22 [phone]

## 2022-09-24 ENCOUNTER — Other Ambulatory Visit: Payer: Self-pay | Admitting: Medical-Surgical

## 2022-10-01 ENCOUNTER — Other Ambulatory Visit: Payer: Self-pay | Admitting: Medical-Surgical

## 2022-10-01 DIAGNOSIS — E785 Hyperlipidemia, unspecified: Secondary | ICD-10-CM

## 2022-10-02 NOTE — Telephone Encounter (Signed)
Appointment 10/15/2022

## 2022-10-05 ENCOUNTER — Other Ambulatory Visit: Payer: Self-pay | Admitting: Medical-Surgical

## 2022-10-08 NOTE — Telephone Encounter (Signed)
Appointment 10/15/2022

## 2022-10-10 ENCOUNTER — Encounter (INDEPENDENT_AMBULATORY_CARE_PROVIDER_SITE_OTHER): Payer: Self-pay

## 2022-10-15 ENCOUNTER — Encounter: Payer: Self-pay | Admitting: Medical-Surgical

## 2022-10-15 ENCOUNTER — Ambulatory Visit (INDEPENDENT_AMBULATORY_CARE_PROVIDER_SITE_OTHER): Payer: Medicare HMO | Admitting: Medical-Surgical

## 2022-10-15 VITALS — BP 124/69 | HR 54 | Ht 62.0 in | Wt 153.0 lb

## 2022-10-15 DIAGNOSIS — I10 Essential (primary) hypertension: Secondary | ICD-10-CM | POA: Diagnosis not present

## 2022-10-15 DIAGNOSIS — Z8669 Personal history of other diseases of the nervous system and sense organs: Secondary | ICD-10-CM

## 2022-10-15 DIAGNOSIS — F411 Generalized anxiety disorder: Secondary | ICD-10-CM

## 2022-10-15 DIAGNOSIS — R1013 Epigastric pain: Secondary | ICD-10-CM | POA: Diagnosis not present

## 2022-10-15 DIAGNOSIS — E039 Hypothyroidism, unspecified: Secondary | ICD-10-CM

## 2022-10-15 DIAGNOSIS — E663 Overweight: Secondary | ICD-10-CM | POA: Diagnosis not present

## 2022-10-15 DIAGNOSIS — E782 Mixed hyperlipidemia: Secondary | ICD-10-CM

## 2022-10-15 DIAGNOSIS — Z62820 Parent-biological child conflict: Secondary | ICD-10-CM | POA: Diagnosis not present

## 2022-10-15 MED ORDER — PRIMIDONE 50 MG PO TABS: 100.0000 mg | ORAL_TABLET | Freq: Every day | ORAL | 1 refills | Status: DC

## 2022-10-15 NOTE — Progress Notes (Unsigned)
Established patient visit  History, exam, impression, and plan:  1. Generalized anxiety disorder 2. Relationship problem between parent and child Very pleasant 72 year old female presenting today with a history of generalized anxiety disorder and relationship issues between her and her 2 sons.  She has been taking Zoloft 50 mg daily, tolerating well without side effects.  Feels medication works fairly well for her although she admits that she is very lonely these days.  She has been making strides to reconnect with one of her sons however the other is not interested in interaction at all.  She is going out to regularly to church, volunteering, exercise programs, etc. for interaction but does not feel she has many close friends.  Not currently doing any counseling and is not interested in a referral today.  Denies SI/HI.  Tearful at times during discussion however mood, thought pattern, cognition, and speech overall normal.  Discussed possibly increasing her medication but she would like to hold steady for now.  Continue Zoloft 50 mg daily.  3. Essential hypertension History of hypertension currently managed with amlodipine 5 mg daily.  Take the medication as prescribed, tolerating well without side effects.  No missed doses.  Not regularly checking blood pressures at home.  Following low-sodium diet.  Activity as noted above and enjoys doing her aerobic exercises.  Cardiopulmonary exam normal.  Denies concerning symptoms today.  Checking labs as below.  Continue amlodipine 5 mg daily as blood pressure is at goal. - CBC with Differential/Platelet - Comprehensive metabolic panel - Lipid panel  4. Hypothyroidism, unspecified type History of hypothyroidism currently treated with levothyroxine 100 mcg daily.  Rechecking TSH today.  Denies side effects to the medication, unusual fatigue, weight fluctuations, palpitations, constipation, and skin/hair/nail changes.  Continue levothyroxine with plan  to titrate depending on results. - TSH  5. Mixed hyperlipidemia Currently taking rosuvastatin 20 mg daily, tolerating well without side effects.  Following a low-fat heart healthy diet.  Exercise as noted above.  Checking labs today.  Continue rosuvastatin as prescribed.  6. Overweight Has been making multiple dietary changes and exercising regularly with a goal for weight loss.  Has been able to lose 9 pounds since her last visit here which is great progress.  Plans to continue with weight loss efforts but feels that it is a very slow process.  Reassured her that she is doing the right things by losing weight slowly and making permanent lifestyle changes rather than fad diets and temporary interventions that lead to recurring weight gain.  7. History of benign essential tremor She does have a history of benign essential tremor that affects bilateral upper extremities but is now starting to see the tremor getting worse in her head/neck.  She is on primidone 50 mg nightly and has not noticed much of a difference with the increased dose.  Interested in options to help with her tremor.  Plan to increase primidone to 100 mg nightly.  8. Epigastric discomfort Has been having intermittent issues with epigastric discomfort which she relates to dietary intake versus reflux.  She is trying to avoid known food triggers and has been doing fairly well.  Overall well-controlled however when she does have flares, she uses over-the-counter Prilosec but only as needed.  Advised that it is okay to use Prilosec as needed however would like to avoid daily dosing due to risks of GI infection and possible worsening of osteoporosis.  Patient verbalized understanding and is agreeable.  Procedures performed this visit: None.  Return in about 6 months (around 04/17/2023) for chronic disease follow up.  __________________________________ Thayer Ohm, DNP, APRN, FNP-BC Primary Care and Sports Medicine [facility] [place]

## 2022-10-16 ENCOUNTER — Encounter: Payer: Self-pay | Admitting: Medical-Surgical

## 2022-10-16 DIAGNOSIS — E663 Overweight: Secondary | ICD-10-CM | POA: Insufficient documentation

## 2022-10-16 DIAGNOSIS — I10 Essential (primary) hypertension: Secondary | ICD-10-CM | POA: Insufficient documentation

## 2022-10-16 DIAGNOSIS — R1013 Epigastric pain: Secondary | ICD-10-CM | POA: Insufficient documentation

## 2022-10-16 DIAGNOSIS — E782 Mixed hyperlipidemia: Secondary | ICD-10-CM | POA: Insufficient documentation

## 2022-10-16 HISTORY — DX: Mixed hyperlipidemia: E78.2

## 2022-10-17 DIAGNOSIS — E039 Hypothyroidism, unspecified: Secondary | ICD-10-CM | POA: Diagnosis not present

## 2022-10-17 DIAGNOSIS — I10 Essential (primary) hypertension: Secondary | ICD-10-CM | POA: Diagnosis not present

## 2022-10-18 ENCOUNTER — Other Ambulatory Visit: Payer: Self-pay | Admitting: Medical-Surgical

## 2022-10-18 LAB — COMPREHENSIVE METABOLIC PANEL: Alkaline Phosphatase: 78 IU/L (ref 44–121)

## 2022-10-18 LAB — LIPID PANEL: Triglycerides: 97 mg/dL (ref 0–149)

## 2022-10-18 MED ORDER — LEVOTHYROXINE SODIUM 100 MCG PO TABS: 100.0000 ug | ORAL_TABLET | Freq: Every day | ORAL | 3 refills | Status: DC

## 2022-11-06 ENCOUNTER — Other Ambulatory Visit: Payer: Self-pay | Admitting: Medical-Surgical

## 2022-11-11 DIAGNOSIS — L820 Inflamed seborrheic keratosis: Secondary | ICD-10-CM | POA: Diagnosis not present

## 2022-11-11 DIAGNOSIS — L570 Actinic keratosis: Secondary | ICD-10-CM | POA: Diagnosis not present

## 2022-11-11 DIAGNOSIS — L814 Other melanin hyperpigmentation: Secondary | ICD-10-CM | POA: Diagnosis not present

## 2022-11-11 DIAGNOSIS — D225 Melanocytic nevi of trunk: Secondary | ICD-10-CM | POA: Diagnosis not present

## 2022-11-11 DIAGNOSIS — L578 Other skin changes due to chronic exposure to nonionizing radiation: Secondary | ICD-10-CM | POA: Diagnosis not present

## 2022-11-13 ENCOUNTER — Other Ambulatory Visit: Payer: Self-pay | Admitting: Medical-Surgical

## 2022-11-13 DIAGNOSIS — Z1231 Encounter for screening mammogram for malignant neoplasm of breast: Secondary | ICD-10-CM

## 2022-11-22 ENCOUNTER — Ambulatory Visit (INDEPENDENT_AMBULATORY_CARE_PROVIDER_SITE_OTHER): Payer: Medicare HMO | Admitting: Medical-Surgical

## 2022-11-22 ENCOUNTER — Encounter: Payer: Self-pay | Admitting: Medical-Surgical

## 2022-11-22 VITALS — BP 127/61 | HR 59 | Resp 20 | Ht 62.0 in | Wt 151.0 lb

## 2022-11-22 DIAGNOSIS — G5701 Lesion of sciatic nerve, right lower limb: Secondary | ICD-10-CM | POA: Diagnosis not present

## 2022-11-22 NOTE — Progress Notes (Unsigned)
        Established patient visit  History, exam, impression, and plan:  No problem-specific Assessment & Plan notes found for this encounter.   Procedures performed this visit: None.  No follow-ups on file.  __________________________________ Thayer Ohm, DNP, APRN, FNP-BC Primary Care and Sports Medicine [facility] [place]

## 2022-11-23 ENCOUNTER — Encounter: Payer: Self-pay | Admitting: Medical-Surgical

## 2022-12-30 ENCOUNTER — Other Ambulatory Visit: Payer: Self-pay | Admitting: Medical-Surgical

## 2022-12-30 DIAGNOSIS — E785 Hyperlipidemia, unspecified: Secondary | ICD-10-CM

## 2023-01-01 ENCOUNTER — Ambulatory Visit: Payer: Medicare HMO

## 2023-01-01 DIAGNOSIS — Z1231 Encounter for screening mammogram for malignant neoplasm of breast: Secondary | ICD-10-CM | POA: Diagnosis not present

## 2023-01-31 ENCOUNTER — Other Ambulatory Visit: Payer: Self-pay | Admitting: Medical-Surgical

## 2023-02-04 ENCOUNTER — Other Ambulatory Visit: Payer: Self-pay | Admitting: Medical-Surgical

## 2023-02-04 MED ORDER — AMLODIPINE BESYLATE 5 MG PO TABS: 5.0000 mg | ORAL_TABLET | Freq: Every day | ORAL | 0 refills | Status: DC

## 2023-02-16 ENCOUNTER — Other Ambulatory Visit: Payer: Self-pay | Admitting: Medical-Surgical

## 2023-02-18 MED ORDER — VALACYCLOVIR HCL 1 G PO TABS: 1000.0000 mg | ORAL_TABLET | Freq: Two times a day (BID) | ORAL | 0 refills | Status: DC | PRN

## 2023-02-22 ENCOUNTER — Encounter: Payer: Self-pay | Admitting: Medical-Surgical

## 2023-02-22 ENCOUNTER — Other Ambulatory Visit: Payer: Self-pay | Admitting: Family Medicine

## 2023-02-22 MED ORDER — NIRMATRELVIR/RITONAVIR (PAXLOVID)TABLET: 3.0000 | ORAL_TABLET | Freq: Two times a day (BID) | ORAL | 0 refills | Status: AC

## 2023-02-22 NOTE — Progress Notes (Signed)
On call service called. Pt tested positive for COVID today  Called patient. She started having symptoms today. She is interested in paxlovid therapy and we discussed side effects. Also discussed holding her cholesterol medication while take this medicine. Recommend she continue supportive care with hydration and call us if she has any other questions or concerns. GFR is adequate for regular dosing of paxlovid   Paxlovid sent to [facility] pharmacy per pt request

## 2023-02-28 ENCOUNTER — Telehealth (INDEPENDENT_AMBULATORY_CARE_PROVIDER_SITE_OTHER): Payer: Medicare HMO | Admitting: Medical-Surgical

## 2023-02-28 ENCOUNTER — Encounter: Payer: Self-pay | Admitting: Medical-Surgical

## 2023-02-28 VITALS — BP 111/62 | HR 63 | Resp 20 | Ht 62.0 in | Wt 142.1 lb

## 2023-02-28 DIAGNOSIS — U071 COVID-19: Secondary | ICD-10-CM | POA: Diagnosis not present

## 2023-02-28 MED ORDER — METHYLPREDNISOLONE 4 MG PO TBPK: ORAL_TABLET | ORAL | 0 refills | Status: DC

## 2023-02-28 MED ORDER — ONDANSETRON 4 MG PO TBDP: 4.0000 mg | ORAL_TABLET | Freq: Three times a day (TID) | ORAL | 0 refills | Status: DC | PRN

## 2023-02-28 NOTE — Progress Notes (Signed)
        Established patient visit  History, exam, impression, and plan:  1. COVID (Primary) Very pleasant 72 year old female presenting today with reports of testing positive for COVID about a week ago.  She called our after-hours nurse and the on-call provider prescribed Paxlovid.  Notes that on Saturday and Sunday she was extremely sick with headache, fatigue, body aches, sore throat, congestion, poor appetite, nausea, and taste changes.  Today she reports that she completed the Paxlovid although she skipped the last dose because it did not feel like it was doing very much.  She had a sore throat this morning however it quickly resolved.  She continues to have headaches, poor appetite, and taste changes.  Gets nauseated when she drinks water with her medications.  Has very little energy and just does not feel like herself.  Exam is benign today and it looks like she is recovering fairly well.  She does still have continued symptoms so plan to add a Medrol Dosepak to aid with full resolution.  Sending Zofran for nausea.  Continue conservative measures and work on rest, pushing fluids, and eating small frequent meals.  Procedures performed this visit: None.  Return if symptoms worsen or fail to improve.  __________________________________ Thayer Ohm, DNP, APRN, FNP-BC Primary Care and Sports Medicine [facility] [place]

## 2023-03-26 DIAGNOSIS — L0100 Impetigo, unspecified: Secondary | ICD-10-CM | POA: Diagnosis not present

## 2023-03-26 DIAGNOSIS — L209 Atopic dermatitis, unspecified: Secondary | ICD-10-CM | POA: Diagnosis not present

## 2023-03-28 ENCOUNTER — Other Ambulatory Visit: Payer: Self-pay | Admitting: Medical-Surgical

## 2023-03-28 DIAGNOSIS — E785 Hyperlipidemia, unspecified: Secondary | ICD-10-CM

## 2023-04-02 DIAGNOSIS — L0100 Impetigo, unspecified: Secondary | ICD-10-CM | POA: Diagnosis not present

## 2023-04-10 ENCOUNTER — Other Ambulatory Visit: Payer: Self-pay | Admitting: Medical-Surgical

## 2023-04-17 ENCOUNTER — Ambulatory Visit: Payer: Medicare HMO | Admitting: Medical-Surgical

## 2023-04-21 ENCOUNTER — Encounter: Payer: Self-pay | Admitting: Medical-Surgical

## 2023-04-21 ENCOUNTER — Ambulatory Visit (INDEPENDENT_AMBULATORY_CARE_PROVIDER_SITE_OTHER): Payer: Medicare HMO | Admitting: Medical-Surgical

## 2023-04-21 VITALS — BP 121/61 | HR 59 | Resp 20 | Ht 62.0 in | Wt 145.9 lb

## 2023-04-21 DIAGNOSIS — I10 Essential (primary) hypertension: Secondary | ICD-10-CM | POA: Diagnosis not present

## 2023-04-21 DIAGNOSIS — Z8669 Personal history of other diseases of the nervous system and sense organs: Secondary | ICD-10-CM | POA: Diagnosis not present

## 2023-04-21 DIAGNOSIS — H9202 Otalgia, left ear: Secondary | ICD-10-CM

## 2023-04-21 DIAGNOSIS — E785 Hyperlipidemia, unspecified: Secondary | ICD-10-CM

## 2023-04-21 DIAGNOSIS — Z23 Encounter for immunization: Secondary | ICD-10-CM | POA: Diagnosis not present

## 2023-04-21 DIAGNOSIS — F411 Generalized anxiety disorder: Secondary | ICD-10-CM | POA: Diagnosis not present

## 2023-04-21 MED ORDER — AMLODIPINE BESYLATE 5 MG PO TABS: 5.0000 mg | ORAL_TABLET | Freq: Every day | ORAL | 3 refills | Status: AC

## 2023-04-21 MED ORDER — PRIMIDONE 50 MG PO TABS: 150.0000 mg | ORAL_TABLET | Freq: Every day | ORAL | 1 refills | Status: DC

## 2023-04-21 MED ORDER — ROSUVASTATIN CALCIUM 20 MG PO TABS
20.0000 mg | ORAL_TABLET | Freq: Every day | ORAL | 3 refills | Status: AC
Start: 2023-04-21 — End: ?

## 2023-04-21 NOTE — Progress Notes (Unsigned)
Subjective:  Patient ID: Natasha Wilson, female    DOB: 27-May-1950, 73 y.o.   MRN: [id number]  Patient Care Team: Christen Butter, NP as PCP - General (Nurse Practitioner)   Chief Complaint:  Medical Management of Chronic Issues   Natasha Wilson is a 73 y.o. female presenting on 04/21/2023 for Medical Management of Chronic Issues   History, Exam,  Impression and Plan   1. Generalized anxiety disorder Very pleasant 73 year old female presenting today with a history of generalized anxiety disorder and relationship issues between her and her 2 sons.  States that medication is working well and is exercising 3-4 times a week with 1 day usually has walking as part of  volunteer group. Denies SI/HI.  Patient declines and is not interested in changing medication regimen observable mood, thought pattern, cognition, and speech normal.   Continue Zoloft 50 mg daily.    2. History of benign essential tremor History of benign essential tremor that is progressively worsening and upper extremities per patient and is now involving  head and neck.  Patient states primidone is not helping much and was inquiring about increasing or changing medication.  Patient is currently on 100 mg primidone p.o. nightly.  Reports slight transient improvement when increased dosage last time.  Story of benign essential tremor that affects bilateral upper extremities but is now starting to see the tremor getting worse in her head/neck. She is on primidone 50 mg nightly and has not noticed much of a difference with the increased dose. Interested in options to help with her tremor. Plan to increase primidone to 100 mg nightly  -     Start primidone (MYSOLINE) 50 MG tablet; Take 3 tablets (150 mg total) by mouth at bedtime.  3. Essential hypertension History of hypertension well-managed with amlodipine 5 mg p.o. daily.  Reports taking the medication as prescribed and denies side effects.  Denies lower extremity swelling chest pain  palpitations, or blurred vision.  Patient does not check blood pressure regularly at home and is on a low-sodium diet.  As noted above patient is exercising 3-4 times a week doing volunteer work walking and aerobic exercise.  Lungs are clear heart rate is regular S1-S2 heard without gallop.  No adventitious breath sounds as noted above and enjoys doing her aerobic exercises. Cardiopulmonary exam normal. Denies concerning symptoms today. Checking labs as below. Continue amlodipine 5 mg daily as blood pressure is at goal.  -     amLODipine (NORVASC) 5 MG tablet; Take 1 tablet (5 mg total) by mouth daily.  4. Acute ear pain, left (Primary) Patient complaining of cyclic ear pain that happens every couple weeks.  But is not experiencing ear pain at this time.  Last episode of ear pain was approximately 10 days ago. Patient states it "feels like her ear is hollow down into her neck underneath my chin".  Bilateral TMs are intact gray pearly with appropriate light reflex.  Negative fluid observed negative erythema in outer ear canal negative tenderness to tragus or auricle.  Bilateral ears are without drainage.  Negative cervical lymphadenopathy.  Oropharynx is pink moist and without edema.  Nares and turbinates are without erythema or edema.  5. Elevated lipids Currently taking rosuvastatin 20 mg daily, tolerating well without side effects. Following a low-fat heart healthy diet.  Aerobic exercise as documented above. Continue rosuvastatin as prescribed.  Patient requesting refill. -Continue and refill rosuvastatin (CRESTOR) 20 MG tablet; Take 1 tablet (20 mg total) by  mouth daily.  Dispense: 90 tablet; Refill: 3   Continue all other maintenance medications.  Follow up plan: Return in about 6 months (around 10/19/2023) for chronic disease follow up.   Relevant past medical, surgical, family, and social history reviewed and updated as indicated.  Allergies and medications reviewed and updated. Data reviewed:  Chart in Epic.   Past Medical History:  Diagnosis Date  . Allergy 6years   eczema  . Depression    sometime  . Generalized anxiety disorder 05/24/2016  . GERD (gastroesophageal reflux disease)   . History of benign essential tremor 10/24/2015  . Hypertension   . Mixed hyperlipidemia 10/16/2022  . Panic attacks 03/15/2020  . Panic attacks   . Skin cancer    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  . Thyroid activity decreased 11/10/2015    Past Surgical History:  Procedure Laterality Date  . ABDOMINAL HYSTERECTOMY    . ANTERIOR AND POSTERIOR REPAIR N/A 02/09/2019   Procedure: ANTERIOR (CYSTOCELE) AND POSTERIOR REPAIR (RECTOCELE);  Surgeon: Alfredo Martinez, MD;  Location: [facility];  Service: Urology;  Laterality: N/A;  . bladder tack    . BREAST CYST EXCISION    . CHOLECYSTECTOMY    . CYSTOSCOPY N/A 02/09/2019   Procedure: CYSTOSCOPY;  Surgeon: Alfredo Martinez, MD;  Location: [facility];  Service: Urology;  Laterality: N/A;  . SKIN CANCER EXCISION N/A    from back    Social History   Socioeconomic History  . Marital status: Single    Spouse name: Not on file  . Number of children: 2  . Years of education: 41  . Highest education level: Associate degree: occupational, Scientist, product/process development, or vocational program  Occupational History  . Occupation: retired    Comment: [employer]  Tobacco Use  . Smoking status: Former    Current packs/day: 0.00    Average packs/day: 1 pack/day for 15.0 years (15.0 ttl pk-yrs)    Types: Cigarettes    Start date: 07/04/1978    Quit date: 07/03/1993    Years since quitting: 29.8  . Smokeless tobacco: Never  Vaping Use  . Vaping status: Never Used  Substance and Sexual Activity  . Alcohol use: Yes    Alcohol/week: 7.0 standard drinks of alcohol    Types: 7 Glasses of wine per week    Comment: 4 ounces a night  . Drug use: No  . Sexual activity: Not Currently    Birth control/protection: None  Other Topics Concern  . Not on file  Social History Narrative   Lives  alone. Her grandson is staying for a few weeks. Runs errands. Watch TV. Reads a lot and plays games on the computer. Enjoys going to aerobics class three times a week. She also enjoys reading.   Social Drivers of Health   Financial Resource Strain: Low Risk  (02/24/2023)   Overall Financial Resource Strain (CARDIA)   . Difficulty of Paying Living Expenses: Not hard at all  Food Insecurity: No Food Insecurity (02/24/2023)   Hunger Vital Sign   . Worried About Programme researcher, broadcasting/film/video in the Last Year: Never true   . Ran Out of Food in the Last Year: Never true  Transportation Needs: No Transportation Needs (02/24/2023)   PRAPARE - Transportation   . Lack of Transportation (Medical): No   . Lack of Transportation (Non-Medical): No  Physical Activity: Sufficiently Active (02/24/2023)   Exercise Vital Sign   . Days of Exercise per Week: 3 days   . Minutes of Exercise per Session: 60  min  Stress: No Stress Concern Present (02/24/2023)   [employer] of Occupational Health - Occupational Stress Questionnaire   . Feeling of Stress : Only a little  Social Connections: Moderately Integrated (02/24/2023)   Social Connection and Isolation Panel [NHANES]   . Frequency of Communication with Friends and Family: Once a week   . Frequency of Social Gatherings with Friends and Family: Three times a week   . Attends Religious Services: More than 4 times per year   . Active Member of Clubs or Organizations: Yes   . Attends Banker Meetings: More than 4 times per year   . Marital Status: Divorced  Catering manager Violence: Not At Risk (07/23/2021)   Humiliation, Afraid, Rape, and Kick questionnaire   . Fear of Current or Ex-Partner: No   . Emotionally Abused: No   . Physically Abused: No   . Sexually Abused: No    Outpatient Encounter Medications as of 04/21/2023  Medication Sig  . acetaminophen (TYLENOL) 500 MG tablet Take 1,000 mg by mouth every 6 (six) hours as needed for moderate  pain.  Marland Kitchen amLODipine (NORVASC) 5 MG tablet Take 1 tablet (5 mg total) by mouth daily.  . cetirizine (ZYRTEC) 10 MG tablet Take 10 mg by mouth daily.  Marland Kitchen levothyroxine (SYNTHROID) 100 MCG tablet Take 1 tablet (100 mcg total) by mouth daily before breakfast.  . ondansetron (ZOFRAN-ODT) 4 MG disintegrating tablet Take 1 tablet (4 mg total) by mouth every 8 (eight) hours as needed for nausea or vomiting.  . primidone (MYSOLINE) 50 MG tablet Take 3 tablets (150 mg total) by mouth at bedtime.  . rosuvastatin (CRESTOR) 20 MG tablet Take 1 tablet (20 mg total) by mouth daily.  . sertraline (ZOLOFT) 50 MG tablet Take 1 tablet (50 mg total) by mouth daily.  . valACYclovir (VALTREX) 1000 MG tablet Take 1 tablet (1,000 mg total) by mouth 2 (two) times daily as needed.  . [DISCONTINUED] amLODipine (NORVASC) 5 MG tablet Take 1 tablet (5 mg total) by mouth daily.  . [DISCONTINUED] methylPREDNISolone (MEDROL DOSEPAK) 4 MG TBPK tablet Take as directed.  . [DISCONTINUED] primidone (MYSOLINE) 50 MG tablet TAKE 2 TABLETS BY MOUTH AT BEDTIME  . [DISCONTINUED] rosuvastatin (CRESTOR) 20 MG tablet Take 1 tablet by mouth once daily   No facility-administered encounter medications on file as of 04/21/2023.    Allergies  Allergen Reactions  . Latex Rash  . Cefuroxime Nausea And Vomiting    Red splotches on face.  . Valsartan Rash    Review of Systems      Objective:  BP 121/61 (BP Location: Left Arm, Cuff Size: Normal)   Pulse (!) 59   Resp 20   Ht 5\' 2"  (1.575 m)   Wt 145 lb 14.4 oz (66.2 kg)   SpO2 97%   BMI 26.69 kg/m    Wt Readings from Last 3 Encounters:  04/21/23 145 lb 14.4 oz (66.2 kg)  02/28/23 142 lb 1.6 oz (64.5 kg)  11/22/22 151 lb (68.5 kg)    Physical Exam  Results for orders placed or performed in visit on 10/15/22  CBC with Differential/Platelet   Collection Time: 10/17/22 11:38 AM  Result Value Ref Range   WBC 6.5 3.4 - 10.8 x10E3/uL   RBC 4.58 3.77 - 5.28 x10E6/uL    Hemoglobin 12.4 11.1 - 15.9 g/dL   Hematocrit 16.1 09.6 - 46.6 %   MCV 84 79 - 97 fL   MCH 27.1 26.6 - 33.0 pg  MCHC 32.3 31.5 - 35.7 g/dL   RDW 40.9 81.1 - 91.4 %   Platelets 192 150 - 450 x10E3/uL   Neutrophils 73 Not Estab. %   Lymphs 17 Not Estab. %   Monocytes 6 Not Estab. %   Eos 3 Not Estab. %   Basos 1 Not Estab. %   Neutrophils Absolute 4.7 1.4 - 7.0 x10E3/uL   Lymphocytes Absolute 1.1 0.7 - 3.1 x10E3/uL   Monocytes Absolute 0.4 0.1 - 0.9 x10E3/uL   EOS (ABSOLUTE) 0.2 0.0 - 0.4 x10E3/uL   Basophils Absolute 0.0 0.0 - 0.2 x10E3/uL   Immature Granulocytes 0 Not Estab. %   Immature Grans (Abs) 0.0 0.0 - 0.1 x10E3/uL  Comprehensive metabolic panel   Collection Time: 10/17/22 11:38 AM  Result Value Ref Range   Glucose 96 70 - 99 mg/dL   BUN 9 8 - 27 mg/dL   Creatinine, Ser 7.82 0.57 - 1.00 mg/dL   eGFR 80 >95 AO/ZHY/8.65   BUN/Creatinine Ratio 11 (L) 12 - 28   Sodium 138 134 - 144 mmol/L   Potassium 4.4 3.5 - 5.2 mmol/L   Chloride 102 96 - 106 mmol/L   CO2 22 20 - 29 mmol/L   Calcium 9.5 8.7 - 10.3 mg/dL   Total Protein 6.5 6.0 - 8.5 g/dL   Albumin 4.4 3.8 - 4.8 g/dL   Globulin, Total 2.1 1.5 - 4.5 g/dL   Bilirubin Total 0.5 0.0 - 1.2 mg/dL   Alkaline Phosphatase 78 44 - 121 IU/L   AST 28 0 - 40 IU/L   ALT 15 0 - 32 IU/L  Lipid panel   Collection Time: 10/17/22 11:38 AM  Result Value Ref Range   Cholesterol, Total 133 100 - 199 mg/dL   Triglycerides 97 0 - 149 mg/dL   HDL 57 >78 mg/dL   VLDL Cholesterol Cal 18 5 - 40 mg/dL   LDL Chol Calc (NIH) 58 0 - 99 mg/dL   Chol/HDL Ratio 2.3 0.0 - 4.4 ratio  TSH   Collection Time: 10/17/22 11:38 AM  Result Value Ref Range   TSH 2.800 0.450 - 4.500 uIU/mL       Pertinent labs & imaging results that were available during my care of the patient were reviewed by me and considered in my medical decision making.   Continue healthy lifestyle choices, including diet (rich in fruits, vegetables, and lean proteins, and low  in salt and simple carbohydrates) and exercise (at least 30 minutes of moderate physical activity daily).  The above assessment and management plan was discussed with the patient. The patient verbalized understanding of and has agreed to the management plan. Patient is aware to call the clinic if they develop any new symptoms or if symptoms persist or worsen. Patient is aware when to return to the clinic for a follow-up visit. Patient educated on when it is appropriate to go to the emergency department.   Maryelizabeth Kaufmann Student AGNP

## 2023-04-21 NOTE — Progress Notes (Unsigned)
   Established patient visit  History, exam, impression, and plan:  1. Cough, unspecified type 2. Sore throat Pleasant 73 year old female presenting today with reports of upper respiratory symptoms.  Notes that this started approximately 5 days ago with her nose and eyes burning.  On Saturday, she felt unwell and by Sunday she notes that she felt awful.  Her throat has been sore and she has been coughing.  Notes that her cough has been occasionally productive of small amounts of pink-tinged sputum, usually in the morning.  Her left ear has now developed pressure/discomfort and she has significant sinus congestion.  She has tried drinking hot tea and increasing her fluid consumption.  She has also used Chloraseptic for the sore throat.  Continues to have significant issues with hoarseness.  She saw ENT and they recommended just using Atrovent nasal spray twice daily and follow-up with them after a couple of months.  POCT strep, flu, and COVID testing all negative today.  Below for physical exam. - POCT rapid strep A - POCT Influenza A/B - POC COVID-19  3. Viral URI with cough Despite negative testing, suspect that her symptoms are truly related to a viral URI.  Discussed the timeline for resolution of a viral illness since symptoms can last 7 to 14 days.  With her severe hoarseness and significant sinus congestion, treating with Decadron 4 mg twice daily.  Adding Tussionex for cough suppression.  Okay to use Tylenol/ibuprofen for fever/discomfort.  Continue conservative measures at home.  If improvement in symptoms not noted over the next 2 to 3 days or symptoms improved but quickly worsen again, consider adding antibiotic therapy for secondary bacterial infection.   Procedures performed this visit: None.  Return if symptoms worsen or fail to improve.  __________________________________ Thayer Ohm, DNP, APRN, FNP-BC Primary Care and Sports Medicine [facility] [place]

## 2023-04-23 ENCOUNTER — Encounter: Payer: Self-pay | Admitting: Medical-Surgical

## 2023-04-23 NOTE — Progress Notes (Signed)
Medical screening examination/treatment was performed by qualified clinical staff member and as supervising provider I was immediately available for consultation/collaboration. I have reviewed documentation and agree with assessment and plan.  Addendum:  Unclear etiology of acute otalgia.  Exam benign today with no current symptoms.  Consider eustachian tube dysfunction as a possible contributor.  If benefit from daily antihistamine and Flonase use if not already doing so.  New regulations regarding pneumococcal vaccination.  Per CDC guidelines, Prevnar 20 given in office today.  Thayer Ohm, DNP, APRN, FNP-BC [facility]

## 2023-05-21 ENCOUNTER — Other Ambulatory Visit: Payer: Self-pay | Admitting: Medical-Surgical

## 2023-05-21 ENCOUNTER — Encounter: Payer: Self-pay | Admitting: Medical-Surgical

## 2023-05-22 MED ORDER — PRIMIDONE 50 MG PO TABS: ORAL_TABLET | ORAL | 1 refills | Status: DC

## 2023-05-22 MED ORDER — VALACYCLOVIR HCL 1 G PO TABS: 1000.0000 mg | ORAL_TABLET | Freq: Two times a day (BID) | ORAL | 0 refills | Status: AC | PRN

## 2023-05-26 ENCOUNTER — Encounter: Payer: Self-pay | Admitting: Medical-Surgical

## 2023-05-26 ENCOUNTER — Ambulatory Visit (INDEPENDENT_AMBULATORY_CARE_PROVIDER_SITE_OTHER): Admitting: Medical-Surgical

## 2023-05-26 VITALS — BP 132/59 | HR 57 | Resp 20 | Ht 62.0 in | Wt 144.2 lb

## 2023-05-26 DIAGNOSIS — L739 Follicular disorder, unspecified: Secondary | ICD-10-CM | POA: Diagnosis not present

## 2023-05-26 NOTE — Progress Notes (Signed)
        Established patient visit  History, exam, impression, and plan:  1. Folliculitis (Primary) Very pleasant 73 year old female presenting today with concern for a lesion that occurred in the right groin/pubic area.  Notes that she had been sitting outside on her deck last week watching the cats play.  That night, she went and and had significant itching in the right inguinal and groin area.  She scratched the area but did not examine it or put anything on it.  The next day, she was still being bothered by the symptoms and felt around.  She found a bump in the pubic area and decided to apply hydrocortisone to it.  She has been doing that regularly however 2 days later, the area was black and yesterday, she noted the block was gone and it was somewhat red.  Today reports that the bump is looking much better but she is seeking reassurance.  On evaluation, she does have a 5 mm x 3 mm ovoid scabbed lesion to the right pubic area.  No erythema, edema, fluctuance, or drainage noted.  Evaluation consistent with an infected hair follicle that is now resolving.  Reassured patient that this is a temporary issue and it looks like it is healing well on its own.  In the future, should this recur, recommend using hydrocortisone twice daily for itching and make sure to do warm compresses for 10 to 15 minutes 4-5 times per day.  If no response to this treatment, recommend in office evaluation.  Procedures performed this visit: None.  Return if symptoms worsen or fail to improve.  __________________________________ Thayer Ohm, DNP, APRN, FNP-BC Primary Care and Sports Medicine [facility] [place]

## 2023-06-27 ENCOUNTER — Other Ambulatory Visit: Payer: Self-pay | Admitting: Medical-Surgical

## 2023-06-27 ENCOUNTER — Other Ambulatory Visit: Payer: Self-pay

## 2023-06-27 MED ORDER — SERTRALINE HCL 50 MG PO TABS: 50.0000 mg | ORAL_TABLET | Freq: Every day | ORAL | 0 refills | Status: DC

## 2023-07-07 ENCOUNTER — Other Ambulatory Visit: Payer: Self-pay | Admitting: Medical-Surgical

## 2023-07-10 ENCOUNTER — Encounter: Payer: Self-pay | Admitting: Medical-Surgical

## 2023-07-10 ENCOUNTER — Ambulatory Visit

## 2023-07-10 ENCOUNTER — Ambulatory Visit (INDEPENDENT_AMBULATORY_CARE_PROVIDER_SITE_OTHER): Admitting: Medical-Surgical

## 2023-07-10 VITALS — BP 116/49 | HR 63 | Resp 20 | Ht 62.0 in | Wt 146.0 lb

## 2023-07-10 DIAGNOSIS — F411 Generalized anxiety disorder: Secondary | ICD-10-CM | POA: Diagnosis not present

## 2023-07-10 DIAGNOSIS — M7061 Trochanteric bursitis, right hip: Secondary | ICD-10-CM

## 2023-07-10 DIAGNOSIS — R413 Other amnesia: Secondary | ICD-10-CM

## 2023-07-10 DIAGNOSIS — M25551 Pain in right hip: Secondary | ICD-10-CM

## 2023-07-10 DIAGNOSIS — M160 Bilateral primary osteoarthritis of hip: Secondary | ICD-10-CM | POA: Diagnosis not present

## 2023-07-10 MED ORDER — SERTRALINE HCL 50 MG PO TABS
50.0000 mg | ORAL_TABLET | Freq: Every day | ORAL | 3 refills | Status: AC
Start: 2023-07-10 — End: ?

## 2023-07-10 MED ORDER — PREDNISONE 50 MG PO TABS
50.0000 mg | ORAL_TABLET | Freq: Every day | ORAL | 0 refills | Status: DC
Start: 2023-07-10 — End: 2023-08-07

## 2023-07-10 MED ORDER — CYCLOBENZAPRINE HCL 10 MG PO TABS
5.0000 mg | ORAL_TABLET | Freq: Three times a day (TID) | ORAL | 0 refills | Status: DC | PRN
Start: 2023-07-10 — End: 2023-08-26

## 2023-07-10 NOTE — Progress Notes (Signed)
        Established patient visit  History, exam, impression, and plan:  1. Trochanteric bursitis of right hip (Primary) 2. Right hip pain Pleasant 73 year old female presenting today with reports of acute right hip pain that started on Friday of last week.  She went to her exercise class that day and by that afternoon/evening, she had severe hip pain when trying to step onto that leg.  She did rest over the weekend and noticed that it was a little bit better.  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  Describes the pain as aching and going from the lateral hip down the thigh towards the knee.  Denies popping, clicking, locking, falls, or recent injuries.  She has tried ibuprofen, Tylenol  arthritis, and pain patches without benefit.  Also tried using heat but did not find this helpful.  On exam, full range of motion to the hip, knee, and ankle.  Pain reproducible with palpation of the trochanteric bursa.  Discussed the likelihood that her pain is specifically related to trochanteric bursitis.  Offered a local injection versus systemic treatment.  Plan for right hip x-rays.  Patient declined an injection and would prefer to do oral steroids so we will start prednisone  50 mg daily.  Adding Flexeril  3 times daily as needed.  Continue ibuprofen as needed.  Okay to use Tylenol  arthritis if desired.  Recommend cold compresses to the affected area.  Exercises for trochanteric bursitis provided to be done at home. - DG Hip Unilat W OR W/O Pelvis 2-3 Views Right; Future - predniSONE  (DELTASONE ) 50 MG tablet; Take 1 tablet (50 mg total) by mouth daily with breakfast.  Dispense: 5 tablet; Refill: 0 - cyclobenzaprine  (FLEXERIL ) 10 MG tablet; Take 0.5-1 tablets (5-10 mg total) by mouth 3 (three) times daily as needed.  Dispense: 30 tablet; Refill: 0  3. Memory changes Has had 2 episodes of concerning symptoms that occurred a week  apart.  Reports that she has been told she will answer a question or say something that other people respond to however she has no memory of speaking the words.  Has also noted difficulty remembering specific things such as the website to send her son money.  Has operated these successfully before but most recently was unable to remember the website or how to send the money to him.  Denies losing or misplacing common objects.  Has issues with frequent headaches that are sharp and stabbing but this is not a new finding.  Plan to evaluate labs as below to rule out metabolic causes of memory changes. - CBC - B12 and Folate Panel - TSH - Sed Rate (ESR)  4. Generalized anxiety disorder Currently taking Zoloft  50 mg daily, tolerating well without side effects.  Feels that the medication is working well for her mood and keeping her anxiety in check.  Denies SI/HI.  Continues Zoloft  as prescribed. - sertraline  (ZOLOFT ) 50 MG tablet; Take 1 tablet (50 mg total) by mouth daily.  Dispense: 90 tablet; Refill: 3  Procedures performed this visit: None.  Return if symptoms worsen or fail to improve.  __________________________________ Maryl Snook, DNP, APRN, FNP-BC Primary Care and Sports Medicine [facility] [place]

## 2023-07-11 ENCOUNTER — Encounter: Payer: Self-pay | Admitting: Medical-Surgical

## 2023-07-11 DIAGNOSIS — R410 Disorientation, unspecified: Secondary | ICD-10-CM

## 2023-07-11 DIAGNOSIS — R413 Other amnesia: Secondary | ICD-10-CM

## 2023-07-11 LAB — CBC
Hematocrit: 40.1 % (ref 34.0–46.6)
Hemoglobin: 12.9 g/dL (ref 11.1–15.9)
MCH: 27.2 pg (ref 26.6–33.0)
MCHC: 32.2 g/dL (ref 31.5–35.7)
MCV: 84 fL (ref 79–97)
Platelets: 183 10*3/uL (ref 150–450)
RBC: 4.75 x10E6/uL (ref 3.77–5.28)
RDW: 13.5 % (ref 11.7–15.4)
WBC: 7.5 10*3/uL (ref 3.4–10.8)

## 2023-07-11 LAB — B12 AND FOLATE PANEL
Folate: 4.1 ng/mL (ref >3.0)
Vitamin B-12: 322 pg/mL (ref 232–1245)

## 2023-07-11 LAB — SEDIMENTATION RATE: Sed Rate: 17 mm/h (ref 0–40)

## 2023-07-11 LAB — TSH: TSH: 1.71 u[IU]/mL (ref 0.450–4.500)

## 2023-07-14 ENCOUNTER — Ambulatory Visit: Admitting: Medical-Surgical

## 2023-07-15 ENCOUNTER — Encounter: Payer: Self-pay | Admitting: Medical-Surgical

## 2023-07-19 ENCOUNTER — Ambulatory Visit: Payer: Self-pay | Admitting: Medical-Surgical

## 2023-07-22 ENCOUNTER — Ambulatory Visit (INDEPENDENT_AMBULATORY_CARE_PROVIDER_SITE_OTHER)

## 2023-07-22 ENCOUNTER — Other Ambulatory Visit

## 2023-07-22 DIAGNOSIS — R413 Other amnesia: Secondary | ICD-10-CM

## 2023-07-22 DIAGNOSIS — R410 Disorientation, unspecified: Secondary | ICD-10-CM

## 2023-07-22 DIAGNOSIS — R4182 Altered mental status, unspecified: Secondary | ICD-10-CM

## 2023-07-22 DIAGNOSIS — I6782 Cerebral ischemia: Secondary | ICD-10-CM | POA: Diagnosis not present

## 2023-07-24 ENCOUNTER — Ambulatory Visit: Payer: Self-pay | Admitting: Medical-Surgical

## 2023-08-07 ENCOUNTER — Ambulatory Visit

## 2023-08-07 VITALS — Ht 62.0 in | Wt 144.0 lb

## 2023-08-07 DIAGNOSIS — Z780 Asymptomatic menopausal state: Secondary | ICD-10-CM | POA: Diagnosis not present

## 2023-08-07 DIAGNOSIS — Z13820 Encounter for screening for osteoporosis: Secondary | ICD-10-CM

## 2023-08-07 DIAGNOSIS — Z0000 Encounter for general adult medical examination without abnormal findings: Secondary | ICD-10-CM

## 2023-08-07 NOTE — Addendum Note (Signed)
 Addended by: Doretha Ganja on: 08/07/2023 04:33 PM   Modules accepted: Orders

## 2023-08-07 NOTE — Progress Notes (Signed)
 Subjective:   Natasha Wilson is a 73 y.o. female who presents for Medicare Annual (Subsequent) preventive examination.  Visit Complete: Virtual I connected with  Natasha Wilson on 08/07/23 by a audio enabled telemedicine application and verified that I am speaking with the correct person using two identifiers.  Patient Location: Home  Provider Location: Office/Clinic  I discussed the limitations of evaluation and management by telemedicine. The patient expressed understanding and agreed to proceed.  Vital Signs: Because this visit was a virtual/telehealth visit, some criteria may be missing or patient reported. Any vitals not documented were not able to be obtained and vitals that have been documented are patient reported.  Patient Medicare AWV questionnaire was completed by the patient on 08/04/2023; I have confirmed that all information answered by patient is correct and no changes since this date.  Cardiac Risk Factors include: advanced age (>17men, >22 women);family history of premature cardiovascular disease;hypertension;dyslipidemia     Objective:    Today's Vitals   08/07/23 1458 08/07/23 1459  Weight: 144 lb (65.3 kg)   Height: 5\' 2"  (1.575 m)   PainSc:  4    Body mass index is 26.34 kg/m.     08/07/2023    3:13 PM 07/30/2022    2:31 PM 07/23/2021    2:05 PM 03/17/2020    9:00 AM 02/22/2019   11:09 AM 02/11/2019    3:28 AM 02/09/2019    3:47 PM  Advanced Directives  Does Patient Have a Medical Advance Directive? Yes Yes Yes Yes Yes Yes Yes  Type of Estate agent of [place];Living will Living will Living will Living will;Healthcare Power of [employer] Power of [place];Living will Healthcare Power of [place];Living will Healthcare Power of Attorney  Does patient want to make changes to medical advance directive? No - Patient declined No - Patient declined No - Patient declined No - Patient declined No - Patient declined No - Patient declined No -  Patient declined  Copy of Healthcare Power of Attorney in Chart? No - copy requested   No - copy requested Yes - validated most recent copy scanned in chart (See row information) No - copy requested Yes - validated most recent copy scanned in chart (See row information)  Would patient like information on creating a medical advance directive?    No - Patient declined No - Patient declined No - Patient declined     Current Medications (verified) Outpatient Encounter Medications as of 08/07/2023  Medication Sig   amLODipine  (NORVASC ) 5 MG tablet Take 1 tablet (5 mg total) by mouth daily.   cetirizine (ZYRTEC) 10 MG tablet Take 10 mg by mouth daily.   ibuprofen (ADVIL) 200 MG tablet Take 200 mg by mouth every 6 (six) hours as needed.   levothyroxine  (SYNTHROID ) 100 MCG tablet Take 1 tablet (100 mcg total) by mouth daily before breakfast.   primidone  (MYSOLINE ) 50 MG tablet Take 2 tablets (100 mg total) by mouth daily with breakfast AND 3 tablets (150 mg total) at bedtime.   rosuvastatin  (CRESTOR ) 20 MG tablet Take 1 tablet (20 mg total) by mouth daily.   sertraline  (ZOLOFT ) 50 MG tablet Take 1 tablet (50 mg total) by mouth daily.   valACYclovir  (VALTREX ) 1000 MG tablet Take 1 tablet (1,000 mg total) by mouth 2 (two) times daily as needed.   acetaminophen  (TYLENOL ) 500 MG tablet Take 1,000 mg by mouth every 6 (six) hours as needed for moderate pain. (Patient not taking: Reported on 08/07/2023)  cyclobenzaprine  (FLEXERIL ) 10 MG tablet Take 0.5-1 tablets (5-10 mg total) by mouth 3 (three) times daily as needed. (Patient not taking: Reported on 08/07/2023)   [DISCONTINUED] ondansetron  (ZOFRAN -ODT) 4 MG disintegrating tablet Take 1 tablet (4 mg total) by mouth every 8 (eight) hours as needed for nausea or vomiting.   [DISCONTINUED] predniSONE  (DELTASONE ) 50 MG tablet Take 1 tablet (50 mg total) by mouth daily with breakfast.   No facility-administered encounter medications on file as of 08/07/2023.     Allergies (verified) Latex, Cefuroxime, and Valsartan    History: Past Medical History:  Diagnosis Date   Allergy 6years   eczema   Depression    sometime   Generalized anxiety disorder 05/24/2016   GERD (gastroesophageal reflux disease)    History of benign essential tremor 10/24/2015   Hypertension    Mixed hyperlipidemia 10/16/2022   Panic attacks 03/15/2020   Panic attacks    Skin cancer    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   Thyroid  activity decreased 11/10/2015   Past Surgical History:  Procedure Laterality Date   ABDOMINAL HYSTERECTOMY     ANTERIOR AND POSTERIOR REPAIR N/A 02/09/2019   Procedure: ANTERIOR (CYSTOCELE) AND POSTERIOR REPAIR (RECTOCELE);  Surgeon: Erman Hayward, MD;  Location: [facility];  Service: Urology;  Laterality: N/A;   bladder tack     BREAST CYST EXCISION     CHOLECYSTECTOMY     CYSTOSCOPY N/A 02/09/2019   Procedure: CYSTOSCOPY;  Surgeon: Erman Hayward, MD;  Location: [facility];  Service: Urology;  Laterality: N/A;   SKIN CANCER EXCISION N/A    from back   Family History  Problem Relation Age of Onset   Dementia Mother    Stroke Mother    Alcohol abuse Father    Heart attack Father    Cancer Sister        LUNG   Depression Paternal Grandfather    Alcohol abuse Son    Cancer Maternal Aunt    Social History   Socioeconomic History   Marital status: Single    Spouse name: Not on file   Number of children: 2   Years of education: 14   Highest education level: Associate degree: occupational, Scientist, product/process development, or vocational program  Occupational History   Occupation: retired    Comment: [employer]  Tobacco Use   Smoking status: Former    Current packs/day: 0.00    Average packs/day: 1 pack/day for 15.0 years (15.0 ttl pk-yrs)    Types: Cigarettes    Start date: 07/04/1978    Quit date: 07/03/1993    Years since quitting: 30.1   Smokeless tobacco: Never  Vaping Use   Vaping status: Never Used  Substance and Sexual Activity   Alcohol use: Yes     Alcohol/week: 7.0 standard drinks of alcohol    Types: 7 Glasses of wine per week    Comment: 4 ounces a night   Drug use: No   Sexual activity: Not Currently    Birth control/protection: None  Other Topics Concern   Not on file  Social History Narrative   Lives alone. Her grandson is staying for a few weeks. Runs errands. Watch TV. Reads a lot and plays games on the computer. Enjoys going to aerobics class three times a week. She also enjoys reading.   Social Drivers of Corporate investment banker Strain: Low Risk  (08/07/2023)   Overall Financial Resource Strain (CARDIA)    Difficulty of Paying Living Expenses: Not hard at all  Food Insecurity: No  Food Insecurity (08/07/2023)   Hunger Vital Sign    Worried About Running Out of Food in the Last Year: Never true    Ran Out of Food in the Last Year: Never true  Transportation Needs: No Transportation Needs (08/07/2023)   PRAPARE - Administrator, Civil Service (Medical): No    Lack of Transportation (Non-Medical): No  Physical Activity: Sufficiently Active (08/07/2023)   Exercise Vital Sign    Days of Exercise per Week: 5 days    Minutes of Exercise per Session: 60 min  Stress: No Stress Concern Present (08/07/2023)   [employer] of Occupational Health - Occupational Stress Questionnaire    Feeling of Stress : Not at all  Social Connections: Moderately Integrated (08/07/2023)   Social Connection and Isolation Panel [NHANES]    Frequency of Communication with Friends and Family: More than three times a week    Frequency of Social Gatherings with Friends and Family: Three times a week    Attends Religious Services: More than 4 times per year    Active Member of Clubs or Organizations: Yes    Attends Engineer, structural: More than 4 times per year    Marital Status: Divorced    Tobacco Counseling Counseling given: Not Answered   Clinical Intake:  Pre-visit preparation completed: Yes  Pain : 0-10 Pain  Score: 4  Pain Type: Chronic pain Pain Location: Hip Pain Orientation: Right Pain Descriptors / Indicators: Aching Pain Onset: More than a month ago Pain Frequency: Constant Pain Relieving Factors: OTC pain medication  Pain Relieving Factors: OTC pain medication  BMI - recorded: 26.34 Diabetes: No  How often do you need to have someone help you when you read instructions, pamphlets, or other written materials from your doctor or pharmacy?: 1 - Never What is the last grade level you completed in school?: 14  Interpreter Needed?: No      Activities of Daily Living    08/07/2023    3:02 PM 08/04/2023   12:31 PM  In your present state of health, do you have any difficulty performing the following activities:  Hearing? 0 0  Vision? 0 0  Difficulty concentrating or making decisions? 0 0  Walking or climbing stairs? 0 0  Dressing or bathing? 0 0  Doing errands, shopping? 0 0  Preparing Food and eating ? N N  Using the Toilet? N N  In the past six months, have you accidently leaked urine? Y Y  Do you have problems with loss of bowel control? N N  Managing your Medications? N N  Managing your Finances? N N  Housekeeping or managing your Housekeeping? N N    Patient Care Team: Cherre Cornish, NP as PCP - General (Nurse Practitioner) Gean Keels, MD as Consulting Physician (Family Medicine)  Indicate any recent Medical Services you may have received from other than [facility] providers in the past year (date may be approximate).     Assessment:   This is a routine wellness examination for Natasha Wilson.  Hearing/Vision screen No results found.   Goals Addressed             This Visit's Progress    Patient Stated       Patient states she would like to lose more weight.       Depression Screen    08/07/2023    3:11 PM 02/28/2023   11:20 AM 02/28/2023   11:02 AM 07/30/2022    2:32 PM 06/14/2022  1:58 PM 04/16/2022    2:50 PM 07/23/2021    2:05 PM  PHQ 2/9 Scores  PHQ  - 2 Score 0 2 2 1 2 2  0  PHQ- 9 Score  11  6 9 11      Fall Risk    08/07/2023    3:13 PM 08/04/2023   12:31 PM 02/28/2023   11:02 AM 10/15/2022    2:21 PM 07/30/2022    2:29 PM  Fall Risk   Falls in the past year? 0 0 0 1 1  Number falls in past yr: 0 0 0 0 0  Injury with Fall? 0 0 0 0 1  Comment    Just a bruise   Risk for fall due to : No Fall Risks  No Fall Risks  History of fall(s)  Follow up Falls evaluation completed  Falls evaluation completed Falls evaluation completed Falls evaluation completed    MEDICARE RISK AT HOME: Medicare Risk at Home Any stairs in or around the home?: Yes If so, are there any without handrails?: No Home free of loose throw rugs in walkways, pet beds, electrical cords, etc?: Yes Adequate lighting in your home to reduce risk of falls?: Yes Life alert?: No Use of a cane, walker or w/c?: No Grab bars in the bathroom?: No Shower chair or bench in shower?: No Elevated toilet seat or a handicapped toilet?: No  TIMED UP AND GO:  Was the test performed?  No    Cognitive Function:        08/07/2023    3:14 PM 07/30/2022    2:38 PM 07/23/2021    2:10 PM 03/17/2020    9:04 AM 02/22/2019   11:15 AM  6CIT Screen  What Year? 0 points 0 points 0 points 0 points 0 points  What month? 0 points 0 points 0 points 0 points 0 points  What time? 0 points 0 points 0 points 0 points 0 points  Count back from 20 0 points 0 points 0 points 0 points 0 points  Months in reverse 0 points 0 points  0 points 0 points  Repeat phrase 0 points 0 points  0 points 2 points  Total Score 0 points 0 points  0 points 2 points    Immunizations Immunization History  Administered Date(s) Administered   Moderna Covid-19 Vaccine Bivalent Booster 47yrs & up 03/26/2021   Moderna Sars-Covid-2 Vaccination 05/03/2019, 06/01/2019, 03/08/2020   PNEUMOCOCCAL CONJUGATE-20 04/21/2023   Tdap 02/08/2006, 11/01/2016   Zoster Recombinant(Shingrix) 04/17/2022, 06/17/2022    TDAP status:  Up to date  Flu Vaccine status: Declined, Education has been provided regarding the importance of this vaccine but patient still declined. Advised may receive this vaccine at local pharmacy or Health Dept. Aware to provide a copy of the vaccination record if obtained from local pharmacy or Health Dept. Verbalized acceptance and understanding.  Pneumococcal vaccine status: Up to date  Covid-19 vaccine status: Declined, Education has been provided regarding the importance of this vaccine but patient still declined. Advised may receive this vaccine at local pharmacy or Health Dept.or vaccine clinic. Aware to provide a copy of the vaccination record if obtained from local pharmacy or Health Dept. Verbalized acceptance and understanding.  Qualifies for Shingles Vaccine? Yes   Zostavax completed No   Shingrix Completed?: Yes  Screening Tests Health Maintenance  Topic Date Due   COVID-19 Vaccine (5 - 2024-25 season) 11/03/2022   INFLUENZA VACCINE  10/03/2023   Medicare Annual Wellness (AWV)  08/06/2024  MAMMOGRAM  12/31/2024   Colonoscopy  08/09/2025   DTaP/Tdap/Td (3 - Td or Tdap) 11/02/2026   Pneumonia Vaccine 33+ Years old  Completed   DEXA SCAN  Completed   Hepatitis C Screening  Completed   Zoster Vaccines- Shingrix  Completed   HPV VACCINES  Aged Out   Meningococcal B Vaccine  Aged Out    Health Maintenance  Health Maintenance Due  Topic Date Due   COVID-19 Vaccine (5 - 2024-25 season) 11/03/2022    Colorectal cancer screening: Type of screening: Colonoscopy. Completed 08/09/2020. Repeat every 5 years  Mammogram status: Completed 01/01/2023. Repeat every year  Bone Density status: Ordered 08/07/2023. Pt provided with contact info and advised to call to schedule appt.  Lung Cancer Screening: (Low Dose CT Chest recommended if Age 5-80 years, 20 pack-year currently smoking OR have quit w/in 15years.) does not qualify.   Lung Cancer Screening Referral: n/a  Additional  Screening:  Hepatitis C Screening: does qualify; Completed 10/27/2015  Vision Screening: Recommended annual ophthalmology exams for early detection of glaucoma and other disorders of the eye. Is the patient up to date with their annual eye exam?  Yes  Who is the provider or what is the name of the office in which the patient attends annual eye exams? Dr Margie Sheller If pt is not established with a provider, would they like to be referred to a provider to establish care? N/a.   Dental Screening: Recommended annual dental exams for proper oral hygiene   Community Resource Referral / Chronic Care Management: CRR required this visit?  No   CCM required this visit?  No     Plan:     I have personally reviewed and noted the following in the patient's chart:   Medical and social history Use of alcohol, tobacco or illicit drugs  Current medications and supplements including opioid prescriptions. Patient is not currently taking opioid prescriptions. Functional ability and status Nutritional status Physical activity Advanced directives List of other physicians Hospitalizations # 0, surgeries # 0, and ER # 0 visits in previous 12 months Vitals Screenings to include cognitive, depression, and falls Referrals and appointments  In addition, I have reviewed and discussed with patient certain preventive protocols, quality metrics, and best practice recommendations. A written personalized care plan for preventive services as well as general preventive health recommendations were provided to patient.     Aubrey Leaf, CMA   08/07/2023   After Visit Summary: (MyChart) Due to this being a telephonic visit, the after visit summary with patients personalized plan was offered to patient via MyChart   Nurse Notes:    Natasha Wilson is a 73 y.o. female patient of Cherre Cornish, NP who had a Medicare Annual Wellness Visit today via telephone. Natasha Wilson is Retired and lives alone. She has 2 children. she  reports that she is socially active and does interact with friends/family regularly. She is moderately physically active and enjoys watching television, reading, playing games on the computer, and going to aerobics.

## 2023-08-07 NOTE — Patient Instructions (Signed)
  Natasha Wilson , Thank you for taking time to come for your Medicare Wellness Visit. I appreciate your ongoing commitment to your health goals. Please review the following plan we discussed and let me know if I can assist you in the future.   These are the goals we discussed:  Goals       Exercise 3x per week (30 min per time)      Increase walking to 3 times a week for at least 30 minutes a day      Exercise 3x per week (30 min per time)      Start back exercising and walking daily.      Patient Stated      03/17/2020 AWV Goal: Fall Prevention  Over the next year, patient will decrease their risk for falls by: Using assistive devices, such as a cane or walker, as needed Identifying fall risks within their home and correcting them by: Removing throw rugs Adding handrails to stairs or ramps Removing clutter and keeping a clear pathway throughout the home Increasing light, especially at night Adding shower handles/bars Raising toilet seat Identifying potential personal risk factors for falls: Medication side effects Incontinence/urgency Vestibular dysfunction Hearing loss Musculoskeletal disorders Neurological disorders Orthostatic hypotension        Patient Stated      Patient stated she would like to loose 10 lbs.      Patient Stated (pt-stated)      Patient stated that she would like to loose some weight and walk more.      Patient Stated      Patient states she would like to lose more weight.         This is a list of the screening recommended for you and due dates:  Health Maintenance  Topic Date Due   COVID-19 Vaccine (5 - 2024-25 season) 11/03/2022   Flu Shot  10/03/2023   Medicare Annual Wellness Visit  08/06/2024   Mammogram  12/31/2024   Colon Cancer Screening  08/09/2025   DTaP/Tdap/Td vaccine (3 - Td or Tdap) 11/02/2026   Pneumonia Vaccine  Completed   DEXA scan (bone density measurement)  Completed   Hepatitis C Screening  Completed   Zoster (Shingles)  Vaccine  Completed   HPV Vaccine  Aged Out   Meningitis B Vaccine  Aged Out

## 2023-08-11 ENCOUNTER — Ambulatory Visit

## 2023-08-11 DIAGNOSIS — M810 Age-related osteoporosis without current pathological fracture: Secondary | ICD-10-CM | POA: Diagnosis not present

## 2023-08-11 DIAGNOSIS — Z13820 Encounter for screening for osteoporosis: Secondary | ICD-10-CM

## 2023-08-11 DIAGNOSIS — Z780 Asymptomatic menopausal state: Secondary | ICD-10-CM

## 2023-08-12 ENCOUNTER — Ambulatory Visit: Payer: Self-pay | Admitting: Medical-Surgical

## 2023-08-12 ENCOUNTER — Other Ambulatory Visit: Payer: Self-pay | Admitting: Medical-Surgical

## 2023-08-12 MED ORDER — ALENDRONATE SODIUM 70 MG PO TABS: 70.0000 mg | ORAL_TABLET | ORAL | 4 refills | Status: DC

## 2023-08-13 ENCOUNTER — Encounter: Payer: Self-pay | Admitting: Medical-Surgical

## 2023-08-26 ENCOUNTER — Encounter: Payer: Self-pay | Admitting: Medical-Surgical

## 2023-08-26 ENCOUNTER — Ambulatory Visit (INDEPENDENT_AMBULATORY_CARE_PROVIDER_SITE_OTHER): Admitting: Medical-Surgical

## 2023-08-26 VITALS — BP 130/67 | HR 67 | Resp 20 | Ht 62.0 in | Wt 148.1 lb

## 2023-08-26 DIAGNOSIS — T50905A Adverse effect of unspecified drugs, medicaments and biological substances, initial encounter: Secondary | ICD-10-CM

## 2023-08-26 DIAGNOSIS — M810 Age-related osteoporosis without current pathological fracture: Secondary | ICD-10-CM

## 2023-08-26 DIAGNOSIS — N3001 Acute cystitis with hematuria: Secondary | ICD-10-CM

## 2023-08-26 LAB — POCT URINALYSIS DIP (CLINITEK)
Bilirubin, UA: NEGATIVE
Glucose, UA: NEGATIVE mg/dL
Ketones, POC UA: NEGATIVE mg/dL
Nitrite, UA: POSITIVE — AB
POC PROTEIN,UA: 30 — AB
Spec Grav, UA: 1.01 (ref 1.010–1.025)
Urobilinogen, UA: 0.2 U/dL
pH, UA: 6 (ref 5.0–8.0)

## 2023-08-26 MED ORDER — ESTRADIOL 0.1 MG/GM VA CREA: TOPICAL_CREAM | VAGINAL | 12 refills | Status: AC

## 2023-08-26 MED ORDER — NITROFURANTOIN MONOHYD MACRO 100 MG PO CAPS
100.0000 mg | ORAL_CAPSULE | Freq: Two times a day (BID) | ORAL | 0 refills | Status: DC
Start: 2023-08-26 — End: 2023-12-11

## 2023-08-26 MED ORDER — HYDROCORTISONE 2.5 % EX OINT: TOPICAL_OINTMENT | CUTANEOUS | 0 refills | Status: DC

## 2023-08-26 NOTE — Progress Notes (Unsigned)
        Established patient visit  History, exam, impression, and plan:  1. Acute cystitis with hematuria (Primary) Very pleasant 73 year old female presenting today with reports of having approximately 1 week of urinary symptoms.  A couple of days last week, she noted that it hurt to pass her urine but this was not with every attempt to void.  She has noted some urinary frequency and overall just does not feel well.  Feels that her urine looked funny over the last couple of days and thinks she may have contracted another UTI.  No fever, chills, nausea, vomiting, flank pain, or gross hematuria.  POCT urinalysis positive for large blood, small protein, nitrates, and moderate leukocytes.  Sending for culture.  We will go ahead and treat with nitrofurantoin  100 mg twice daily x 5 days.  Once culture returns, we may need to switch this to a different antibiotic depending on susceptibilities.  Discussed other contributors to her frequent urinary issues.  Suspect she may have some postmenopausal vaginal atrophy that plays a role.  We will start with estradiol  cream to the external vaginal services and around the urethra nightly for 14 days then reduce to 3 times weekly to see if this will be beneficial. - POCT URINALYSIS DIP (CLINITEK) - Urine Culture - nitrofurantoin , macrocrystal-monohydrate, (MACROBID ) 100 MG capsule; Take 1 capsule (100 mg total) by mouth 2 (two) times daily.  Dispense: 10 capsule; Refill: 0  2. Adverse effect of drug, initial encounter Notes that she recently started Fosamax  for osteoporosis.  She feels that she is not going to be able to tolerate the medication and is not keen on taking it.  Took her dose on Saturday and when she woke on Sunday, her face was red, irritated, and chapped.  Her eyes were also irritated and itchy with a foreign body sensation like grit.  The areas involved were swollen.  Notes that her symptoms of gotten a little better today but has not fully resolved.  She  has been using Cetaphil on her face but that has not been very helpful.  After discussion, feel that the skin reaction and ocular symptoms are likely a result of medication intolerance.  We have added Fosamax  to her allergy list and she was advised to longer take this.  Discussed various options to treat osteoporosis.  She is interested in Prolia so we will plan to get this sent through her insurance for approval.  Okay to use Benadryl  25-50 mg every 6 hours as needed for itching.  May want to start an antihistamine eyedrop bilaterally to reduce irritation.  Okay to use saline eyedrops throughout the day for moisture.  Adding hydrocortisone topically to the irritated areas of the face twice daily for up to 14 days followed by at least a 2-week break.  Procedures performed this visit: None.  No follow-ups on file.  __________________________________ Zada FREDRIK Palin, DNP, APRN, FNP-BC Primary Care and Sports Medicine [facility] [place]

## 2023-08-28 ENCOUNTER — Telehealth: Payer: Self-pay

## 2023-08-28 ENCOUNTER — Encounter: Payer: Self-pay | Admitting: Medical-Surgical

## 2023-08-28 ENCOUNTER — Other Ambulatory Visit (HOSPITAL_COMMUNITY): Payer: Self-pay

## 2023-08-28 MED ORDER — DENOSUMAB 60 MG/ML ~~LOC~~ SOSY: 60.0000 mg | PREFILLED_SYRINGE | SUBCUTANEOUS | Status: DC

## 2023-08-28 NOTE — Telephone Encounter (Signed)
 Natasha Wilson

## 2023-08-28 NOTE — Telephone Encounter (Signed)
 PA submitted via CMM. Key: [id number]

## 2023-08-28 NOTE — Telephone Encounter (Signed)
 Prolia VOB initiated via [email or web address]  Next Prolia inj DUE: NEW START   PHARMACY BENEFIT: [phone]

## 2023-08-29 NOTE — Telephone Encounter (Signed)
 Pt ready for scheduling for PROLIA  on or after : 08/29/23  Option# 1: Buy/Bill (Office supplied medication)  Out-of-pocket cost due at time of clinic visit: $347  Number of injection/visits approved: 2  Primary: [employer] Prolia  co-insurance: 20% Admin fee co-insurance: $15  Secondary: --- Prolia  co-insurance:  Admin fee co-insurance:   Medical Benefit Details: Date Benefits were checked: 08/28/23 Deductible: NO/ Coinsurance: 20%/ Admin Fee: $15  Prior Auth: APPROVED PA# [id number] Expiration Date: 08/28/23-03/03/24  # of doses approved: 2 ----------------------------------------------------------------------- Option# 2- Med Obtained from pharmacy:  Pharmacy benefit: Copay [phone] (Paid to pharmacy) Admin Fee: $15 (Pay at clinic)  Prior Auth: N/A PA# Expiration Date:   # of doses approved:   If patient wants fill through the pharmacy benefit please send prescription to: [employer], and include estimated need by date in rx notes. Pharmacy will ship medication directly to the office.  Patient NOT eligible for Prolia  Copay Card. Copay Card can make patient's cost as little as $25. Link to apply: [email or web address]://[email or web address]  ** This summary of benefits is an estimation of the patient's out-of-pocket cost. Exact cost may very based on individual plan coverage.

## 2023-08-30 ENCOUNTER — Encounter: Payer: Self-pay | Admitting: Medical-Surgical

## 2023-08-31 ENCOUNTER — Ambulatory Visit: Payer: Self-pay | Admitting: Medical-Surgical

## 2023-08-31 LAB — URINE CULTURE

## 2023-08-31 MED ORDER — CIPROFLOXACIN HCL 500 MG PO TABS: 500.0000 mg | ORAL_TABLET | Freq: Two times a day (BID) | ORAL | 0 refills | Status: AC

## 2023-09-24 ENCOUNTER — Telehealth: Payer: Self-pay

## 2023-09-24 DIAGNOSIS — M810 Age-related osteoporosis without current pathological fracture: Secondary | ICD-10-CM

## 2023-09-24 NOTE — Telephone Encounter (Signed)
 Natasha Wilson called back. She requested that I send her a message through MyChart.

## 2023-09-24 NOTE — Telephone Encounter (Signed)
-----   Message from Zada Palin sent at 09/01/2023  7:28 PM EDT ----- If she wants to give oral options another try, we could try Actonel or Boniva.  Thanks, Joy ----- Message ----- From: Bonny Jon DEL, CMA Sent: 09/01/2023  11:37 AM EDT To: Zada Palin, NP  The cost to the patient would be around $347. She states she cannot afford to pay this amount. ----- Message ----- From: Palin Zada, NP Sent: 08/28/2023   7:38 PM EDT To: Jon DEL Bonny, CMA  It looks like she has gotten the PA for starting her on Prolia . She needs labs checked prior to giving her first injection. Is there anything else that I need to do to get her started? Thanks, Joy ----- Message ----- From: Roxanna Donzell BROCKS Sent: 08/28/2023   3:04 PM EDT To: Zada Palin, NP

## 2023-09-24 NOTE — Telephone Encounter (Signed)
I called and left a message for a return call.  

## 2023-09-26 MED ORDER — IBANDRONATE SODIUM 150 MG PO TABS: 150.0000 mg | ORAL_TABLET | ORAL | 3 refills | Status: AC

## 2023-10-07 ENCOUNTER — Other Ambulatory Visit: Payer: Self-pay | Admitting: Medical-Surgical

## 2023-10-20 ENCOUNTER — Ambulatory Visit (INDEPENDENT_AMBULATORY_CARE_PROVIDER_SITE_OTHER): Payer: Medicare HMO | Admitting: Medical-Surgical

## 2023-10-20 ENCOUNTER — Encounter: Payer: Self-pay | Admitting: Medical-Surgical

## 2023-10-20 VITALS — BP 149/69 | HR 60 | Resp 20 | Ht 62.0 in | Wt 144.8 lb

## 2023-10-20 DIAGNOSIS — M810 Age-related osteoporosis without current pathological fracture: Secondary | ICD-10-CM | POA: Diagnosis not present

## 2023-10-20 DIAGNOSIS — M25551 Pain in right hip: Secondary | ICD-10-CM

## 2023-10-20 DIAGNOSIS — I10 Essential (primary) hypertension: Secondary | ICD-10-CM

## 2023-10-20 DIAGNOSIS — F411 Generalized anxiety disorder: Secondary | ICD-10-CM | POA: Diagnosis not present

## 2023-10-20 DIAGNOSIS — E782 Mixed hyperlipidemia: Secondary | ICD-10-CM | POA: Diagnosis not present

## 2023-10-20 DIAGNOSIS — Z8669 Personal history of other diseases of the nervous system and sense organs: Secondary | ICD-10-CM | POA: Diagnosis not present

## 2023-10-20 DIAGNOSIS — B001 Herpesviral vesicular dermatitis: Secondary | ICD-10-CM | POA: Diagnosis not present

## 2023-10-20 DIAGNOSIS — R42 Dizziness and giddiness: Secondary | ICD-10-CM | POA: Insufficient documentation

## 2023-10-20 DIAGNOSIS — E039 Hypothyroidism, unspecified: Secondary | ICD-10-CM

## 2023-10-20 MED ORDER — MECLIZINE HCL 25 MG PO TABS
25.0000 mg | ORAL_TABLET | Freq: Three times a day (TID) | ORAL | 0 refills | Status: DC | PRN
Start: 2023-10-20 — End: 2023-12-11

## 2023-10-20 NOTE — Progress Notes (Signed)
 Established patient visit   History of Present Illness   Discussed the use of AI scribe software for clinical note transcription with the patient, who gave verbal consent to proceed.  History of Present Illness   Natasha Wilson is a 73 year old female who presents with dizziness and balance issues.  Dizziness and vertigo - Sudden onset of dizziness last night while getting back into bed - Sensation of head movement even when still - Requires slow and precise movements to maintain balance - Current episode feels different from previous vertigo experiences - No associated nausea, vomiting, or anxiety - No medication taken for dizziness; managed with fruit and water  intake - Episodes of losing balance this morning - No falls reported  History of vertigo - Previous significant episode of vertigo resulting in vomiting and emergency room treatment - Current episode is subjectively different from prior vertigo   Hypertension - Managed with Amlodipine  - Not regularly checking at home - No CP, SOB, LE edema, or palpitations   Osteoporosis - Previously intolerant of Fosamax  - Prolia  not financially feasible - Boniva  ordered, but not started yet  Hypothyroidism - Well managed with levothyroxine  100 mcg daily  Essential tremor - Taking Primidone  100mg  q AM, 150mg  q pm - Hand tremors improved - Head tremor persists, worse since dizziness started  Hyperlipidemia - Well managed with Crestor  - UTD on lipid checks     Physical Exam   Physical Exam Vitals reviewed.  Constitutional:      General: She is not in acute distress.    Appearance: Normal appearance. She is not ill-appearing.  HENT:     Head: Normocephalic and atraumatic.  Cardiovascular:     Rate and Rhythm: Normal rate and regular rhythm.     Pulses: Normal pulses.     Heart sounds: Normal heart sounds. No murmur heard.    No friction rub. No gallop.  Pulmonary:     Effort: Pulmonary effort is normal. No  respiratory distress.     Breath sounds: Normal breath sounds. No wheezing.  Skin:    General: Skin is warm and dry.  Neurological:     Mental Status: She is alert and oriented to person, place, and time.  Psychiatric:        Mood and Affect: Mood normal.        Behavior: Behavior normal.        Thought Content: Thought content normal.        Judgment: Judgment normal.    Assessment & Plan   Assessment and Plan    Benign paroxysmal vertigo Acute dizziness and imbalance likely due to head and eye movements. Symptoms suggest vertigo. - Prescribe meclizine  25 mg  3 times daily as needed for dizziness. - Provide information on Epley maneuver. - Advise taking meclizine  30 minutes before Epley maneuver.  Essential tremor Intermittent tremor affecting head and neck. Primidone  adjustment deferred until dizziness resolves. - Monitor tremor symptoms and adjust primidone  if dizziness resolves and tremor persists.  Right hip osteoarthritis, planned for surgery Right hip osteoarthritis with planned surgery in January. - Refer to orthopedic surgery for surgical planning.  Osteoporosis Osteoporosis managed with Boniva . Discussed insurance coverage and patient assistance for Prolia . Informed about esophageal irritation risk. - Start Boniva  for osteoporosis management. - Monitor response to Boniva  and consider Prolia  if ineffective.  Hypertension Slightly elevated blood pressure, possibly related to dizziness and stress. - Recheck blood pressure during visit, still elevated. - Monitor BP at  home with a goal of less than 130/80.  - Continue Amlodipine . - If home readings remain elevated, return for medication management.  Hypothyroidism Well-managed on levothyroxine . Recent TSH normal. - Continue levothyroxine .  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 - Continue prn Valtrex .  Major depressive disorder/Anxiety Well-managed on Zoloft .      Follow up   Return in  about 6 months (around 04/21/2024) for chronic disease follow up.  __________________________________ Zada FREDRIK Palin, DNP, APRN, FNP-BC Primary Care and Sports Medicine [facility] [place]

## 2023-10-20 NOTE — Patient Instructions (Signed)
 How to Perform the Epley Maneuver The Epley maneuver is an exercise that relieves symptoms of vertigo. Vertigo is the feeling that you or your surroundings are moving when they are not. When you feel vertigo, you may feel like the room is spinning and may have trouble walking. The Epley maneuver is used for a type of vertigo caused by a calcium deposit in a part of the inner ear. The maneuver involves changing head positions to help the deposit move out of the area. You can do this maneuver at home whenever you have symptoms of vertigo. You can repeat it in 24 hours if your vertigo has not gone away. Even though the Epley maneuver may relieve your vertigo for a few weeks, it is possible that your symptoms will return. This maneuver relieves vertigo, but it does not relieve dizziness. What are the risks? If it is done correctly, the Epley maneuver is considered safe. Sometimes it can lead to dizziness or nausea that goes away after a short time. If you develop other symptoms--such as changes in vision, weakness, or numbness--stop doing the maneuver and call your health care provider. Supplies needed: A bed or table. A pillow. How to do the Epley maneuver     Sit on the edge of a bed or table with your back straight and your legs extended or hanging over the edge of the bed or table. Turn your head halfway toward the affected ear or side as told by your health care provider. Lie backward quickly with your head turned until you are lying flat on your back. Your head should dangle (head-hanging position). You may want to position a pillow under your shoulders. Hold this position for at least 30 seconds. If you feel dizzy or have symptoms of vertigo, continue to hold the position until the symptoms stop. Turn your head to the opposite direction until your unaffected ear is facing down. Your head should continue to dangle. Hold this position for at least 30 seconds. If you feel dizzy or have symptoms of  vertigo, continue to hold the position until the symptoms stop. Turn your whole body to the same side as your head so that you are positioned on your side. Your head will now be nearly facedown and no longer needs to dangle. Hold for at least 30 seconds. If you feel dizzy or have symptoms of vertigo, continue to hold the position until the symptoms stop. Sit back up. You can repeat the maneuver in 24 hours if your vertigo does not go away. Follow these instructions at home: For 24 hours after doing the Epley maneuver: Keep your head in an upright position. When lying down to sleep or rest, keep your head raised (elevated) with two or more pillows. Avoid excessive neck movements. Activity Do not drive or use machinery if you feel dizzy. After doing the Epley maneuver, return to your normal activities as told by your health care provider. Ask your health care provider what activities are safe for you. General instructions Drink enough fluid to keep your urine pale yellow. Do not drink alcohol. Take over-the-counter and prescription medicines only as told by your health care provider. Keep all follow-up visits. This is important. Preventing vertigo symptoms Ask your health care provider if there is anything you should do at home to prevent vertigo. He or she may recommend that you: Keep your head elevated with two or more pillows while you sleep. Do not sleep on the side of your affected ear. Get  up slowly from bed. Avoid sudden movements during the day. Avoid extreme head positions or movement, such as looking up or bending over. Contact a health care provider if: Your vertigo gets worse. You have other symptoms, including: Nausea. Vomiting. Headache. Get help right away if you: Have vision changes. Have a headache or neck pain that is severe or getting worse. Cannot stop vomiting. Have new numbness or weakness in any part of your body. These symptoms may represent a serious problem  that is an emergency. Do not wait to see if the symptoms will go away. Get medical help right away. Call your local emergency services (911 in the [place] Do not drive yourself to the hospital. Summary Vertigo is the feeling that you or your surroundings are moving when they are not. The Epley maneuver is an exercise that relieves symptoms of vertigo. If the Epley maneuver is done correctly, it is considered safe. This information is not intended to replace advice given to you by your health care provider. Make sure you discuss any questions you have with your health care provider. Document Revised: 11/15/2022 Document Reviewed: 11/15/2022 Elsevier Patient Education  2024 [employer].

## 2023-10-29 ENCOUNTER — Ambulatory Visit: Admitting: Orthopaedic Surgery

## 2023-10-29 ENCOUNTER — Encounter: Payer: Self-pay | Admitting: Orthopaedic Surgery

## 2023-10-29 DIAGNOSIS — M7061 Trochanteric bursitis, right hip: Secondary | ICD-10-CM | POA: Diagnosis not present

## 2023-10-29 DIAGNOSIS — M25551 Pain in right hip: Secondary | ICD-10-CM | POA: Diagnosis not present

## 2023-10-29 NOTE — Progress Notes (Signed)
 The patient is a 73 year old female who comes in with right hip pain for about 2 months now with no known injury.  She says she cannot sleep on that side and she points to the trochanteric area as a source of her pain.  She denies any groin pain.  She denies any radicular components of her pain as well.  On exam her right hip moves smoothly and fluidly with no blocks or rotation.  When I have her lay on her side with her right hip up she has significant pain to palpation of the trochanteric area and the proximal IT band.  Her leg lengths are equal.  Her left hip exam is normal.  An AP pelvis and lateral of the right hip shows mild arthritic changes.  Her signs and symptoms are consistent with hip bursitis and tendinitis.  I did recommend a steroid injection over this area but she has deferred this hoping to try some medications first.  I recommended Voltaren gel to try 2-3 times daily as well as Salonpas patches.  She can also try stretching exercises.  I did offer outpatient physical therapy but she has deferred this.  Follow-up can be as needed but if it does worsen enough to consider a steroid injection we are happy to see her back to place an injection around her right hip trochanteric area.

## 2023-11-04 ENCOUNTER — Encounter: Payer: Self-pay | Admitting: Sports Medicine

## 2023-11-11 DIAGNOSIS — D225 Melanocytic nevi of trunk: Secondary | ICD-10-CM | POA: Diagnosis not present

## 2023-11-11 DIAGNOSIS — L578 Other skin changes due to chronic exposure to nonionizing radiation: Secondary | ICD-10-CM | POA: Diagnosis not present

## 2023-11-11 DIAGNOSIS — L821 Other seborrheic keratosis: Secondary | ICD-10-CM | POA: Diagnosis not present

## 2023-11-11 DIAGNOSIS — L814 Other melanin hyperpigmentation: Secondary | ICD-10-CM | POA: Diagnosis not present

## 2023-11-18 ENCOUNTER — Other Ambulatory Visit: Payer: Self-pay | Admitting: Medical-Surgical

## 2023-11-18 DIAGNOSIS — Z1231 Encounter for screening mammogram for malignant neoplasm of breast: Secondary | ICD-10-CM

## 2023-11-20 ENCOUNTER — Encounter: Payer: Self-pay | Admitting: Medical-Surgical

## 2023-11-20 ENCOUNTER — Ambulatory Visit (INDEPENDENT_AMBULATORY_CARE_PROVIDER_SITE_OTHER): Admitting: Medical-Surgical

## 2023-11-20 VITALS — BP 123/71 | HR 62 | Resp 20 | Ht 62.0 in | Wt 150.0 lb

## 2023-11-20 DIAGNOSIS — H04301 Unspecified dacryocystitis of right lacrimal passage: Secondary | ICD-10-CM | POA: Diagnosis not present

## 2023-11-20 MED ORDER — ERYTHROMYCIN 5 MG/GM OP OINT
1.0000 | TOPICAL_OINTMENT | Freq: Three times a day (TID) | OPHTHALMIC | 0 refills | Status: AC
Start: 2023-11-20 — End: 2023-11-25

## 2023-11-20 NOTE — Progress Notes (Signed)
 Established patient visit   History of Present Illness   Discussed the use of AI scribe software for clinical note transcription with the patient, who gave verbal consent to proceed.  History of Present Illness   Natasha Wilson is a 73 year old female who presents with right eye drainage and irritation.  Right ocular discharge and irritation - Clear, watery discharge from the right eye for the past couple of weeks - Increased tearing noted yesterday but stopped today - Now has dryness and irritation present - No itching or pain - Redness present - No crusting or eyelid adherence in the morning - Symptoms localized around the tear duct area, initially thought to be swollen   Response to symptomatic treatment - Gel drops and a warm compress applied once without relief  Associated nasal symptoms - Nasal congestion occurring at night, particularly near the tear duct area  Ocular trauma and foreign body exposure - No recent eye injury - No foreign body exposure     Physical Exam   Physical Exam Vitals reviewed.  Constitutional:      General: She is not in acute distress.    Appearance: Normal appearance.  HENT:     Head: Normocephalic and atraumatic.  Eyes:     General: Vision grossly intact. Gaze aligned appropriately. No allergic shiner, visual field deficit or scleral icterus.       Right eye: Discharge (clear up until today, now none) present. No foreign body.        Left eye: No discharge.     Extraocular Movements:     Right eye: Normal extraocular motion.     Left eye: Normal extraocular motion.     Conjunctiva/sclera:     Right eye: Right conjunctiva is injected. No exudate.    Left eye: Left conjunctiva is not injected. No exudate.    Comments: Mild erythema to the right upper lid and the inner canthus.  Cardiovascular:     Rate and Rhythm: Normal rate and regular rhythm.     Pulses: Normal pulses.     Heart sounds: Normal heart sounds. No murmur heard.     No friction rub. No gallop.  Pulmonary:     Effort: Pulmonary effort is normal. No respiratory distress.     Breath sounds: Normal breath sounds. No wheezing.  Skin:    General: Skin is warm and dry.  Neurological:     Mental Status: She is alert and oriented to person, place, and time.  Psychiatric:        Mood and Affect: Mood normal.        Behavior: Behavior normal.        Thought Content: Thought content normal.        Judgment: Judgment normal.    Assessment & Plan   Problem List Items Addressed This Visit   None Visit Diagnoses       Dacryocystitis of right lacrimal sac    -  Primary      Assessment and Plan    Acute right dacryocystitis Acute dacryocystitis with clear drainage, dryness, and irritation. Likely tear duct blockage, not infection or allergies. Temporary blockage suspected. - Apply warm compresses to right eye up to four times daily. - Massage inner part of right eye near tear duct after warm compresses. - Prescribe erythromycin  eye ointment three times daily for five days. - Consider ENT referral if no improvement.     Follow up  Return if symptoms worsen or fail to improve. __________________________________ Zada FREDRIK Palin, DNP, APRN, FNP-BC Primary Care and Sports Medicine [facility]

## 2023-11-20 NOTE — Patient Instructions (Signed)
 Dacryocystitis Dacryocystitis is an infection of the sac that collects tears (lacrimal sac). The lacrimal sac is located between the inner corner of the eye and the nose. There are glands around the eyes (lacrimal glands) that make tears that keep the surface of the eye wet and protected. Tears drain from the eye through two small tubes (ducts) in the eyelids near the nose. These ducts carry tears to the lacrimal sac. Another tube (nasolacrimal duct) carries tears from the lacrimal sac down into the back of the nose to the throat. Dacryocystitis can be sudden (acute) or long-lasting (chronic). It usually affects only one eye. What are the causes? The most common cause of this condition is a blocked nasolacrimal duct. When this duct is blocked, tears cannot drain into the nose, and tears become backed up in the lacrimal sac. Bacteria that normally live in the eye, on the skin, or in the nose start to grow inside the sac and cause infection. The nasolacrimal duct may become blocked because of: A nose or sinus infection that spreads into the duct. A duct that is abnormally shaped (malformed). A growth or swelling in the nose. An injury or surgery that narrows or scars the duct. Dacryocystitis also may start as an eye infection that spreads to the lacrimal sac. Sometimes, the cause of dacryocystitis is not known. What increases the risk? You are more likely to develop this condition if you: Are older than 73 years of age. Are female. Women tend to have a narrower nasolacrimal duct than men. Have had nasal trauma, such as a broken nose or nasal surgery. Have nasal polyps. Use certain medicines, including some nasal sprays and eye drops. Many babies are born with some form of a blocked nasolacrimal duct that causes extra tearing, but most grow out of it by age 51. In rare cases, it can lead to an infection of the lacrimal sac. What are the signs or symptoms? Symptoms of acute dacryocystitis start  suddenly and may include: Excessive tearing. A matted, watery eye. Swelling and redness over the lacrimal sac. Discharge of mucus or pus into the eye. This may cause blurred vision. Eye pain. A fever. Symptoms of chronic dacryocystitis usually include: More tearing than usual. Discharge of mucus or pus into the eye. Blurred vision. Redness, pain, and swelling are less common with chronic dacryocystitis. How is this diagnosed? This condition is diagnosed based on your medical history and a physical exam. During the exam, your health care provider may press between your eye and the side of your nose to see if discharge flows back onto your eye. You may also have tests, such as: Removal of a sample of discharge from your eye or nose to check for infection. A test where your health care provider will put a yellow dye in your eye to see if the dye disappears from your eye (dye disappearance test). A swab may be placed in your nose to see if the dye drains to your nose. A test where a thin, lighted scope (endoscope) is placed in your nose to determine what is causing the duct blockage (nasal endoscopy). How is this treated? Acute dacryocystitis is treated with antibiotic medicines. These are usually given by mouth (orally), but they can also be given as eye drops or ointments. If the infection has spread to tissues around the eye (orbital cellulitis), antibiotics may be given through an IV. Chronic dacryocystitis usually needs to be treated with surgery. Surgical options include: Probing the duct to open  it. Widening the duct. Removing a nasal blockage. In babies with a blocked nasolacrimal duct, massages of the eyelids near the nose may be helpful. Follow these instructions at home: Medicines Take over-the-counter and prescription medicines only as told by your health care provider. If you were prescribed an antibiotic medicine, take or apply it as told by your health care provider. Do not stop  using the antibiotic even if you start to feel better. General instructions If directed by your health care provider, apply a clean, warm compress to the inside corner of your eye. To do this: Wash your hands first. Hold the compress over the inside corner of your eye for a few minutes. Repeat this every few hours during the day. Keep all follow-up visits. This is important. Contact a health care provider if: You have a fever. Your symptoms come back, do not improve, or get worse. Get help right away if you have: Redness, swelling, and pain that spread to the tissues around your eye. A sudden decrease in your vision. Summary Dacryocystitis can be sudden (acute) or long-lasting (chronic). The most common cause of this condition is a blocked nasolacrimal duct. Acute dacryocystitis is treated with antibiotic medicines. Chronic dacryocystitis usually needs to be treated with surgery. Keep all follow-up visits. This is important. This information is not intended to replace advice given to you by your health care provider. Make sure you discuss any questions you have with your health care provider. Document Revised: 09/18/2020 Document Reviewed: 09/18/2020 Elsevier Patient Education  2024 [employer].

## 2023-12-11 ENCOUNTER — Encounter: Payer: Self-pay | Admitting: Family Medicine

## 2023-12-11 ENCOUNTER — Ambulatory Visit (INDEPENDENT_AMBULATORY_CARE_PROVIDER_SITE_OTHER): Admitting: Family Medicine

## 2023-12-11 ENCOUNTER — Other Ambulatory Visit: Payer: Self-pay | Admitting: Medical-Surgical

## 2023-12-11 ENCOUNTER — Ambulatory Visit: Payer: Self-pay | Admitting: *Deleted

## 2023-12-11 VITALS — BP 136/51 | HR 70 | Ht 62.0 in | Wt 150.1 lb

## 2023-12-11 DIAGNOSIS — R300 Dysuria: Secondary | ICD-10-CM | POA: Diagnosis not present

## 2023-12-11 DIAGNOSIS — R350 Frequency of micturition: Secondary | ICD-10-CM

## 2023-12-11 LAB — POCT URINALYSIS DIP (CLINITEK)
Bilirubin, UA: NEGATIVE
Glucose, UA: NEGATIVE mg/dL
Ketones, POC UA: NEGATIVE mg/dL
Nitrite, UA: NEGATIVE
POC PROTEIN,UA: NEGATIVE
Spec Grav, UA: 1.01 (ref 1.010–1.025)
Urobilinogen, UA: 0.2 U/dL
pH, UA: 6.5 (ref 5.0–8.0)

## 2023-12-11 MED ORDER — NITROFURANTOIN MONOHYD MACRO 100 MG PO CAPS: 100.0000 mg | ORAL_CAPSULE | Freq: Two times a day (BID) | ORAL | 0 refills | Status: DC

## 2023-12-11 NOTE — Progress Notes (Signed)
   Acute Office Visit  Subjective:     Patient ID: Natasha Wilson, female    DOB: Apr 04, 1950, 72 y.o.   MRN: [id number]  Chief Complaint  Patient presents with   Urinary Frequency    Pt reports sxs started last night. She also has had diarrhea for the past 3 days   Dysuria    HPI Patient is in today for urinary symptoms.  She says she actually started having loose stools and diarrhea about 3 days ago.  She says it is not typical for her though sometimes if she eats a salad she will get diarrhea but it does not usually last for days and she did have a salad 2 days in a row she said she did not eat out.  She said there was another friend at [employer] who evidently was also sick yesterday.  But last night she started having pelvic cramping and says it felt like a urinary tract infection so she came in today she has not seen any hematuria or blood in the stool.  No fevers or chills or sweats.  ROS      Objective:    BP (!) 136/51   Pulse 70   Ht 5' 2 (1.575 m)   Wt 150 lb 1.3 oz (68.1 kg)   SpO2 100%   BMI 27.45 kg/m    Physical Exam  Results for orders placed or performed in visit on 12/11/23  POCT URINALYSIS DIP (CLINITEK)  Result Value Ref Range   Color, UA yellow yellow   Clarity, UA clear clear   Glucose, UA negative negative mg/dL   Bilirubin, UA negative negative   Ketones, POC UA negative negative mg/dL   Spec Grav, UA 8.989 8.989 - 1.025   Blood, UA trace-intact (A) negative   pH, UA 6.5 5.0 - 8.0   POC PROTEIN,UA negative negative, trace   Urobilinogen, UA 0.2 0.2 or 1.0 E.U./dL   Nitrite, UA Negative Negative   Leukocytes, UA Trace (A) Negative        Assessment & Plan:   Problem List Items Addressed This Visit   None Visit Diagnoses       Dysuria    -  Primary   Relevant Orders   POCT URINALYSIS DIP (CLINITEK) (Completed)   Urine Culture     Urinary frequency       Relevant Orders   POCT URINALYSIS DIP (CLINITEK) (Completed)   Urine Culture       Pelvic pain-urinalysis positive for leukocytes she has been having diarrhea for 3 days so she is at high risk of urinary tract infection.  She was unable to give enough of a specimen to send for culture but she says she will try to bring us  back a sample later today I did go ahead and send over prescription for Macrobid .  Diarrhea-encouraged her to eat a bland diet such as toast and rice today.  She has not vomited or had any nausea.  Last bowel movement was about 4 hours ago.  If it does not continue to improve over the next couple days then please let us  know.  Consider infectious causes.  Meds ordered this encounter  Medications   nitrofurantoin , macrocrystal-monohydrate, (MACROBID ) 100 MG capsule    Sig: Take 1 capsule (100 mg total) by mouth 2 (two) times daily.    Dispense:  10 capsule    Refill:  0    No follow-ups on file.  Dorothyann Byars, MD

## 2023-12-11 NOTE — Telephone Encounter (Signed)
 FYI Only or Action Required?: FYI only for provider.  Patient was last seen in primary care on 11/20/2023 by Natasha Mini, NP.  Called Nurse Triage reporting Urinary Frequency.  Symptoms began yesterday.  Interventions attempted: OTC medications: tylenol .  Symptoms are: gradually worsening.  Triage Disposition: See HCP Within 4 Hours (Or PCP Triage)  Patient/caregiver understands and will follow disposition?: yes   Reason for Disposition  [1] SEVERE pain with urination (e.g., excruciating) AND [2] not improved after 2 hours of pain medicine  Answer Assessment - Initial Assessment Questions 1. SEVERITY: How bad is the pain?  (e.g., Scale 1-10; mild, moderate, or severe)     8/10 2. FREQUENCY: How many times have you had painful urination today?      3 times today 3. PATTERN: Is pain present every time you urinate or just sometimes?      yes 4. ONSET: When did the painful urination start?      Last night 5. FEVER: Do you have a fever? If Yes, ask: What is your temperature, how was it measured, and when did it start?     Has not checked- felt warm 6. PAST UTI: Have you had a urine infection before? If Yes, ask: When was the last time? and What happened that time?      Yes- 4-5 months  8. OTHER SYMPTOMS: Do you have any other symptoms? (e.g., blood in urine, flank pain, genital sores, urgency, vaginal discharge)     Loose bowels, frequency  Protocols used: Urination Pain - Female-A-AH  Copied from CRM #[id number]. Topic: Clinical - Red Word Triage >> Dec 11, 2023  7:58 AM Donna BRAVO wrote: Red Word that prompted transfer to Nurse Triage:  Computer froze, call dropped. Called patient back. Patient has possible UTI, started last night with painful urination, loose bowels, above pubic bone is painful, frequently urination

## 2023-12-11 NOTE — Progress Notes (Signed)
 Natasha Wilson                                          MRN: [id number]   12/11/2023   The [facility] Quality Team Specialist reviewed this patient medical record for the purposes of chart review for care gap closure. The following were reviewed: abstraction for care gap closure-controlling blood pressure.    [facility] Quality Team

## 2023-12-15 ENCOUNTER — Ambulatory Visit: Payer: Self-pay | Admitting: Family Medicine

## 2023-12-15 LAB — URINE CULTURE

## 2023-12-15 LAB — SPECIMEN STATUS REPORT

## 2023-12-15 MED ORDER — SULFAMETHOXAZOLE-TRIMETHOPRIM 800-160 MG PO TABS: 1.0000 | ORAL_TABLET | Freq: Two times a day (BID) | ORAL | 0 refills | Status: DC

## 2023-12-15 NOTE — Progress Notes (Signed)
 HI Natasha Wilson, your urine came back showing the bacteria may be resistant to the antibiotic I gave you. But only grew out a tiny amount of bacteria. I am sending over a new script for you to pick up and take if you are still having symptoms

## 2023-12-25 ENCOUNTER — Ambulatory Visit

## 2023-12-25 ENCOUNTER — Encounter: Payer: Self-pay | Admitting: Medical-Surgical

## 2023-12-25 VITALS — BP 144/67 | HR 58 | Temp 97.6°F | Ht 62.0 in

## 2023-12-25 DIAGNOSIS — R1030 Lower abdominal pain, unspecified: Secondary | ICD-10-CM | POA: Diagnosis not present

## 2023-12-25 LAB — POCT URINALYSIS DIP (CLINITEK)
Bilirubin, UA: NEGATIVE
Blood, UA: NEGATIVE
Glucose, UA: NEGATIVE mg/dL
Ketones, POC UA: NEGATIVE mg/dL
Leukocytes, UA: NEGATIVE
Nitrite, UA: NEGATIVE
POC PROTEIN,UA: NEGATIVE
Spec Grav, UA: 1.01 (ref 1.010–1.025)
Urobilinogen, UA: 0.2 U/dL
pH, UA: 6.5 (ref 5.0–8.0)

## 2023-12-25 NOTE — Telephone Encounter (Signed)
 Called and offered an early nurse visit appointment. Patient is coming in today

## 2023-12-25 NOTE — Progress Notes (Signed)
   Established Patient Office Visit  Subjective   Patient ID: Natasha Wilson, female    DOB: August 22, 1950  Age: 73 y.o. MRN: [id number]  Chief Complaint  Patient presents with   urine check    Urine check - nurse visit.      HPI  Urine check - nurse visit. Patient c/o lower abdominal pain that was severe last night after application of pea size amount of estrogen topically to vaginal area. She states she does this every other night.  Denies  abnormal urinary frequency  or dysuria.   ROS    Objective:     BP (!) 144/67   Pulse (!) 58   Temp 97.6 F (36.4 C)   Ht 5' 2 (1.575 m)   SpO2 99%   BMI 27.45 kg/m    Physical Exam   Results for orders placed or performed in visit on 12/25/23  POCT URINALYSIS DIP (CLINITEK)  Result Value Ref Range   Color, UA light yellow (A) yellow   Clarity, UA clear clear   Glucose, UA negative negative mg/dL   Bilirubin, UA negative negative   Ketones, POC UA negative negative mg/dL   Spec Grav, UA 8.989 8.989 - 1.025   Blood, UA negative negative   pH, UA 6.5 5.0 - 8.0   POC PROTEIN,UA negative negative, trace   Urobilinogen, UA 0.2 0.2 or 1.0 E.U./dL   Nitrite, UA Negative Negative   Leukocytes, UA Negative Negative      The 10-year ASCVD risk score (Arnett DK, et al., 2019) is: 19.9%    Assessment & Plan:  Urinalysis normal result. Per Zada Palin, NP - no need to send for urine culture as this result is normal.  I told the patient we would give her a call if she was to not take estrogen tonight but she did state she does thie every other night and will not use this tonight.   Problem List Items Addressed This Visit   None Visit Diagnoses       Lower abdominal pain    -  Primary   Relevant Orders   POCT URINALYSIS DIP (CLINITEK) (Completed)      joy jessup to complete visit    No follow-ups on file.    Suzen SHAUNNA Plenty, LPN

## 2023-12-25 NOTE — Patient Instructions (Signed)
 Awaiting provider response for follow up recommendations.

## 2023-12-26 ENCOUNTER — Encounter: Payer: Self-pay | Admitting: Medical-Surgical

## 2023-12-26 ENCOUNTER — Ambulatory Visit (INDEPENDENT_AMBULATORY_CARE_PROVIDER_SITE_OTHER): Admitting: Medical-Surgical

## 2023-12-26 VITALS — BP 171/65 | HR 61 | Resp 20 | Ht 62.0 in | Wt 153.0 lb

## 2023-12-26 DIAGNOSIS — R197 Diarrhea, unspecified: Secondary | ICD-10-CM | POA: Diagnosis not present

## 2023-12-26 DIAGNOSIS — R1030 Lower abdominal pain, unspecified: Secondary | ICD-10-CM | POA: Diagnosis not present

## 2023-12-26 MED ORDER — METRONIDAZOLE 500 MG PO TABS: 500.0000 mg | ORAL_TABLET | Freq: Three times a day (TID) | ORAL | 0 refills | Status: AC

## 2023-12-26 MED ORDER — CIPROFLOXACIN HCL 500 MG PO TABS: 500.0000 mg | ORAL_TABLET | Freq: Two times a day (BID) | ORAL | 0 refills | Status: AC

## 2023-12-26 NOTE — Patient Instructions (Signed)
Diverticulitis  Diverticulitis happens when poop (stool) and bacteria get trapped in small pouches in the colon called diverticula. These pouches may form if you have a condition called diverticulosis. When the poop and bacteria get trapped, it can cause an infection and inflammation. Diverticulitis may cause severe stomach pain and diarrhea. It can also lead to tissue damage in your colon. This can cause bleeding or blockage. In some cases, the diverticula may burst (rupture). This can cause infected poop to go into other parts of your abdomen. What are the causes? This condition is caused by poop getting trapped in the diverticula. This allows bacteria to grow. It can lead to inflammation and infection. What increases the risk? You are more likely to get this condition if you have diverticulosis. You are also more at risk if: You are overweight or obese. You do not get enough exercise. You drink alcohol. You smoke. You eat a lot of red meat, such as beef, pork, or lamb. You do not get enough fiber. Foods high in fiber include fruits, vegetables, beans, nuts, and whole grains. You are over 35 years of age. What are the signs or symptoms? Symptoms of this condition may include: Pain and tenderness in the abdomen. This pain is often felt on the left side but may occur in other spots. Fever and chills. Nausea and vomiting. Cramping. Bloating. Changes in how often you poop. Blood in your poop. How is this diagnosed? This condition is diagnosed based on your medical history and a physical exam. You may also have tests done to make sure there is nothing else causing your condition. These tests may include: Blood tests. Tests done on your pee (urine). A CT scan of the abdomen. You may need to have a colonoscopy. This is an exam to look at your whole large intestine. During the exam, a tube is put into the opening of your butt (anus) and then moved into your rectum, colon, and other parts of  the large intestine. This exam is done to look at the diverticula. It can also see if there is something else that may be causing your symptoms. How is this treated? Most cases are mild and can be treated at home. You may be told to: Take over-the-counter pain medicine. Only eat and drink clear liquids. Take antibiotics. Rest. More severe cases may need to be treated at a hospital. Treatment may include: Not eating or drinking. Taking pain medicines. Getting antibiotics through an IV. Getting fluids and nutrition through an IV. Surgery. Follow these instructions at home: Medicines Take over-the-counter and prescription medicines only as told by your health care provider. These include fiber supplements, probiotics, and medicines to soften your poop (stool softeners). If you were prescribed antibiotics, take them as told by your provider. Do not stop using the antibiotic even if you start to feel better. Ask your provider if the medicine prescribed to you requires you to avoid driving or using machinery. Eating and drinking  Follow the diet told by your provider. You may need to only eat and drink liquids. After your symptoms get better, you may be able to return to a more normal diet. You may be told to eat at least 25 grams (25 g) of fiber each day. Fiber makes it easier to poop. Healthy sources of fiber include: Berries. One cup has 4-8 g of fiber. Beans or lentils. One-half cup has 5-8 g of fiber. Green vegetables. One cup has 4 g of fiber. Avoid eating red meat.  General instructions Do not use any products that contain nicotine or tobacco. These products include cigarettes, chewing tobacco, and vaping devices, such as e-cigarettes. If you need help quitting, ask your provider. Exercise for at least 30 minutes, 3 times a week. Exercise hard enough to raise your heart rate and break a sweat. Contact a health care provider if: Your pain gets worse. Your pooping does not go back to  normal. Your symptoms do not get better with treatment. Your symptoms get worse all of a sudden. You have a fever. You vomit more than one time. Your poop is bloody, black, or tarry. This information is not intended to replace advice given to you by your health care provider. Make sure you discuss any questions you have with your health care provider. Document Revised: 11/15/2021 Document Reviewed: 11/15/2021 Elsevier Patient Education  2024 [employer].

## 2023-12-26 NOTE — Progress Notes (Signed)
        Established patient visit  History, exam, impression, and plan:  1. Lower abdominal pain (Primary) Pleasant 73 year old female presenting today with continued lower abdominal pain rated as severe, constant, and debilitating. Was here yesterday for a nurse visit where she did a urine sample. POCT urinalysis completely normal indicating no UTI. No fevers. Has had some low back pain as well as intermittent nausea. Having some issue with abnormal bowel movements, looser than normal. No melena or hematochezia. On exam, bowel sounds + in all 4 quadrants. Some lower abdomen bloating but soft throughout. Bilateral upper quadrants nontender. Bilateral lower quadrants very tender with guarding, left worse than right. Reviewed potential etiologies with patient including colitis and diverticulitis. Plan to treat with Cipro  and Flagyl x 7 days. If no improvement in symptoms with the antibiotics, plan for imaging and stool studies. Recommend working on bland dietary choices and then advance as tolerated.   Procedures performed this visit: None.  Return if symptoms worsen or fail to improve.  __________________________________ Zada FREDRIK Palin, DNP, APRN, FNP-BC Primary Care and Sports Medicine [facility]

## 2023-12-30 NOTE — Telephone Encounter (Signed)
 Patient was seen 12/25/23.

## 2024-01-01 ENCOUNTER — Telehealth: Payer: Self-pay

## 2024-01-02 ENCOUNTER — Encounter: Payer: Self-pay | Admitting: Medical-Surgical

## 2024-01-05 ENCOUNTER — Encounter: Payer: Self-pay | Admitting: Radiology

## 2024-01-05 DIAGNOSIS — R197 Diarrhea, unspecified: Secondary | ICD-10-CM | POA: Diagnosis not present

## 2024-01-05 NOTE — Addendum Note (Signed)
 Addended by: FANNY IVANOFF L on: 01/05/2024 11:06 AM   Modules accepted: Orders

## 2024-01-06 ENCOUNTER — Ambulatory Visit: Payer: Self-pay | Admitting: Medical-Surgical

## 2024-01-06 LAB — GI PROFILE, STOOL, PCR

## 2024-01-06 LAB — SPECIMEN STATUS REPORT

## 2024-02-06 ENCOUNTER — Ambulatory Visit

## 2024-02-06 DIAGNOSIS — Z1231 Encounter for screening mammogram for malignant neoplasm of breast: Secondary | ICD-10-CM

## 2024-02-15 ENCOUNTER — Ambulatory Visit: Payer: Self-pay | Admitting: Medical-Surgical

## 2024-02-19 ENCOUNTER — Ambulatory Visit

## 2024-03-08 ENCOUNTER — Other Ambulatory Visit: Payer: Self-pay | Admitting: Medical-Surgical

## 2024-04-02 ENCOUNTER — Other Ambulatory Visit: Payer: Self-pay | Admitting: Medical-Surgical

## 2024-04-22 ENCOUNTER — Ambulatory Visit: Admitting: Medical-Surgical

## 2024-04-23 ENCOUNTER — Ambulatory Visit: Admitting: Medical-Surgical

## 2024-08-10 ENCOUNTER — Ambulatory Visit
